# Patient Record
Sex: Male | Born: 1935 | Race: White | Hispanic: No | Marital: Married | Smoking: Former smoker
Health system: Southern US, Community
[De-identification: ages and names within clinical notes are randomized; demographics above are authoritative.]

## PROBLEM LIST (undated history)

## (undated) DIAGNOSIS — S42309A Unspecified fracture of shaft of humerus, unspecified arm, initial encounter for closed fracture: Secondary | ICD-10-CM

## (undated) DIAGNOSIS — S82899A Other fracture of unspecified lower leg, initial encounter for closed fracture: Secondary | ICD-10-CM

## (undated) DIAGNOSIS — I10 Essential (primary) hypertension: Secondary | ICD-10-CM

## (undated) DIAGNOSIS — E039 Hypothyroidism, unspecified: Secondary | ICD-10-CM

## (undated) DIAGNOSIS — F10988 Alcohol use, unspecified with other alcohol-induced disorder: Secondary | ICD-10-CM

## (undated) DIAGNOSIS — F329 Major depressive disorder, single episode, unspecified: Secondary | ICD-10-CM

## (undated) DIAGNOSIS — E7800 Pure hypercholesterolemia, unspecified: Secondary | ICD-10-CM

## (undated) DIAGNOSIS — F32A Depression, unspecified: Secondary | ICD-10-CM

## (undated) DIAGNOSIS — F10188 Alcohol abuse with other alcohol-induced disorder: Secondary | ICD-10-CM

## (undated) DIAGNOSIS — F1027 Alcohol dependence with alcohol-induced persisting dementia: Secondary | ICD-10-CM

## (undated) HISTORY — DX: Unspecified fracture of shaft of humerus, unspecified arm, initial encounter for closed fracture: S42.309A

## (undated) HISTORY — DX: Other fracture of unspecified lower leg, initial encounter for closed fracture: S82.899A

## (undated) HISTORY — DX: Depression, unspecified: F32.A

## (undated) HISTORY — DX: Essential (primary) hypertension: I10

## (undated) HISTORY — DX: Hypothyroidism, unspecified: E03.9

## (undated) HISTORY — DX: Pure hypercholesterolemia, unspecified: E78.00

## (undated) HISTORY — DX: Alcohol abuse with other alcohol-induced disorder: F10.188

## (undated) HISTORY — DX: Alcohol dependence with alcohol-induced persisting dementia: F10.27

## (undated) HISTORY — DX: Major depressive disorder, single episode, unspecified: F32.9

## (undated) HISTORY — DX: Alcohol use, unspecified with other alcohol-induced disorder: F10.988

---
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
## 1965-03-28 HISTORY — PX: VARICOCELECTOMY: SHX1084

## 1993-03-28 HISTORY — PX: ELBOW SURGERY: SHX618

## 1995-02-16 ENCOUNTER — Ambulatory Visit: Admission: RE | Admit: 1995-02-16 | Payer: Self-pay | Source: Ambulatory Visit | Admitting: Surgery

## 1999-01-05 ENCOUNTER — Ambulatory Visit: Admission: AD | Admit: 1999-01-05 | Payer: Self-pay | Source: Ambulatory Visit | Admitting: Gastroenterology

## 2000-07-21 ENCOUNTER — Emergency Department: Admit: 2000-07-21 | Payer: Self-pay | Source: Emergency Department | Admitting: Emergency Medicine

## 2002-03-12 ENCOUNTER — Ambulatory Visit: Admission: RE | Admit: 2002-03-12 | Payer: Self-pay | Source: Ambulatory Visit | Admitting: Ophthalmology

## 2004-01-06 ENCOUNTER — Observation Stay
Admission: EM | Admit: 2004-01-06 | Disposition: A | Discharge status: Home / Self Care | Payer: Self-pay | Source: Emergency Department | Admitting: Internal Medicine

## 2004-02-06 ENCOUNTER — Ambulatory Visit: Admission: RE | Admit: 2004-02-06 | Payer: Self-pay | Source: Ambulatory Visit | Admitting: Gastroenterology

## 2006-02-06 ENCOUNTER — Ambulatory Visit
Admit: 2006-02-06 | Disposition: A | Discharge status: Home / Self Care | Payer: Self-pay | Source: Ambulatory Visit | Admitting: Urology

## 2006-02-06 LAB — CBC WITH AUTO DIFFERENTIAL CERNER
Basophils Absolute: 0 /mm3 (ref 0.0–0.2)
Basophils: 0 % (ref 0–2)
Eosinophils Absolute: 0.1 /mm3 (ref 0.0–0.7)
Eosinophils: 2 % (ref 0–5)
Granulocytes Absolute: 2.8 /mm3 (ref 1.8–8.1)
Hematocrit: 42.2 % (ref 42.0–52.0)
Hgb: 14.4 G/DL (ref 13.0–17.0)
Lymphocytes Absolute: 2.1 /mm3 (ref 0.5–4.4)
Lymphocytes: 37 % (ref 15–41)
MCH: 32.8 PG — ABNORMAL HIGH (ref 28.0–32.0)
MCHC: 34.3 G/DL (ref 32.0–36.0)
MCV: 95.6 FL (ref 80.0–100.0)
MPV: 8.3 FL (ref 7.4–10.4)
Monocytes Absolute: 0.7 /mm3 (ref 0.0–1.2)
Monocytes: 13 % — ABNORMAL HIGH (ref 0–11)
Neutrophils %: 49 % — ABNORMAL LOW (ref 52–75)
Platelets: 234 /mm3 (ref 140–400)
RBC: 4.41 /mm3 — ABNORMAL LOW (ref 4.70–6.00)
RDW: 14.1 % (ref 11.5–15.0)
WBC: 5.7 /mm3 (ref 3.5–10.8)

## 2006-02-06 LAB — BASIC METABOLIC PANEL
BUN: 16 mg/dL (ref 8–20)
CO2: 31 mEq/L — ABNORMAL HIGH (ref 21–30)
Calcium: 8.9 mg/dL (ref 8.6–10.2)
Chloride: 99 mEq/L (ref 98–107)
Creatinine: 1.2 mg/dL (ref 0.6–1.5)
Glucose: 84 mg/dL (ref 70–100)
Potassium: 4.5 mEq/L (ref 3.6–5.0)
Sodium: 139 mEq/L (ref 136–146)

## 2006-02-06 LAB — PT/INR
PT INR: 0.9 {INR} (ref 0.9–1.1)
PT: 11.3 s (ref 10.8–13.3)

## 2006-02-06 LAB — APTT: PTT: 31 s (ref 21–32)

## 2006-02-06 LAB — GFR

## 2009-04-05 ENCOUNTER — Emergency Department: Admit: 2009-04-05 | Payer: Self-pay | Source: Emergency Department | Admitting: Emergency Medicine

## 2009-04-05 LAB — GFR: EGFR: 53.4

## 2009-04-05 LAB — COMPREHENSIVE METABOLIC PANEL
ALT: 37 U/L (ref 21–72)
AST (SGOT): 42 U/L (ref 20–57)
Albumin/Globulin Ratio: 1.4 (ref 1.1–1.8)
Albumin: 4.1 g/dL (ref 3.7–5.1)
Alkaline Phosphatase: 59 U/L (ref 43–122)
BUN: 24 mg/dL — ABNORMAL HIGH (ref 7–21)
Bilirubin, Total: 0.6 mg/dL (ref 0.2–1.3)
CO2: 28 mEq/L (ref 22–31)
Calcium: 9.4 mg/dL (ref 8.6–10.2)
Chloride: 100 mEq/L (ref 98–107)
Creatinine: 1.3 mg/dL (ref 0.5–1.4)
Globulin: 3 g/dL (ref 2.0–3.7)
Glucose: 96 mg/dL (ref 70–100)
Potassium: 4.7 mEq/L (ref 3.6–5.0)
Protein, Total: 7.1 g/dL (ref 6.0–8.0)
Sodium: 137 mEq/L (ref 136–143)

## 2009-04-05 LAB — CBC AND DIFFERENTIAL
Basophils %: 0 % (ref 0–2)
Basophils Absolute: 0 10*3/uL (ref 0.00–0.20)
Eosinophils %: 0 % (ref 0–5)
Eosinophils Absolute: 0 10*3/uL (ref 0.00–0.70)
Granulocytes Absolute: 5.8 10*3/uL (ref 1.80–8.10)
Hematocrit: 41.3 % — ABNORMAL LOW (ref 42.0–52.0)
Hgb: 13.9 g/dL (ref 13.0–17.0)
Immature Granulocytes Absolute: 0.01 10*3/uL
Immature Granulocytes: 0 % (ref 0–1)
Lymphocytes Absolute: 1.4 10*3/uL (ref 0.50–4.40)
Lymphocytes: 17 % (ref 15–41)
MCH: 32.6 pg — ABNORMAL HIGH (ref 28.0–32.0)
MCHC: 33.7 g/dL (ref 32.0–36.0)
MCV: 96.9 fL (ref 80.0–100.0)
MPV: 10.7 fL (ref 9.4–12.3)
Monocytes Absolute: 0.9 10*3/uL (ref 0.00–1.20)
Monocytes: 11 % (ref 0–11)
Neutrophils %: 72 % (ref 52–75)
Platelets: 204 10*3/uL (ref 140–400)
RBC: 4.26 10*6/uL — ABNORMAL LOW (ref 4.70–6.00)
RDW: 14 % (ref 12–15)
WBC: 8.09 10*3/uL (ref 3.50–10.80)

## 2009-04-05 LAB — TROPONIN I: Troponin I: 0.01 ng/mL (ref 0.00–0.03)

## 2009-11-17 ENCOUNTER — Emergency Department: Admit: 2009-11-17 | Payer: Self-pay | Source: Emergency Department | Admitting: Emergency Medicine

## 2009-11-24 ENCOUNTER — Ambulatory Visit
Admit: 2009-11-24 | Disposition: A | Discharge status: Home / Self Care | Payer: Self-pay | Source: Ambulatory Visit | Admitting: Neurology

## 2009-11-24 LAB — CREATININE, SERUM: Creatinine: 1.1 mg/dL (ref 0.5–1.4)

## 2009-11-24 LAB — GFR: EGFR: >60

## 2009-11-24 LAB — BUN: BUN: 23 mg/dL — ABNORMAL HIGH (ref 7–21)

## 2009-12-01 ENCOUNTER — Ambulatory Visit
Admit: 2009-12-01 | Disposition: A | Discharge status: Home / Self Care | Payer: Self-pay | Source: Ambulatory Visit | Admitting: Neurology

## 2009-12-07 ENCOUNTER — Inpatient Hospital Stay
Admission: EM | Admit: 2009-12-07 | Disposition: A | Discharge status: Home / Self Care | Payer: Self-pay | Source: Ambulatory Visit | Admitting: Internal Medicine

## 2009-12-07 LAB — T4, FREE: T4 Free: 1.38 ng/dL (ref 0.78–1.85)

## 2009-12-07 LAB — CBC AND DIFFERENTIAL
Baso(Absolute): 0.01 10*3/uL (ref 0.00–0.20)
Basophils: 0 % (ref 0–2)
Eosinophils Absolute: 0 10*3/uL (ref 0.00–0.70)
Eosinophils: 0 % (ref 0–5)
Hematocrit: 37.6 % — ABNORMAL LOW (ref 42.0–52.0)
Hgb: 14 g/dL (ref 13.0–17.0)
Immature Granulocytes Absolute: 0.05 10*3/uL
Immature Granulocytes: 0 % (ref 0–1)
Lymphocytes Absolute: 1.43 10*3/uL (ref 0.50–4.40)
Lymphocytes: 12 % — ABNORMAL LOW (ref 15–41)
MCH: 32.9 pg — ABNORMAL HIGH (ref 28.0–32.0)
MCHC: 37.2 g/dL — ABNORMAL HIGH (ref 32.0–36.0)
MCV: 88.3 fL (ref 80.0–100.0)
MPV: 9.9 fL (ref 9.4–12.3)
Monocytes Absolute: 1.8 10*3/uL — ABNORMAL HIGH (ref 0.00–1.20)
Monocytes: 14 % — ABNORMAL HIGH (ref 0–11)
Neutrophils Absolute: 9.23 10*3/uL
Neutrophils: 74 % (ref 52–75)
Platelets: 197 10*3/uL (ref 140–400)
RBC: 4.26 10*6/uL — ABNORMAL LOW (ref 4.70–6.00)
RDW: 13 % (ref 12–15)
WBC: 12.47 10*3/uL — ABNORMAL HIGH (ref 3.50–10.80)

## 2009-12-07 LAB — BASIC METABOLIC PANEL
BUN: 19 mg/dL (ref 7–21)
BUN: 18 mg/dL (ref 7–21)
CO2: 29 mEq/L (ref 22–31)
CO2: 30 mEq/L (ref 22–31)
Calcium: 7.4 mg/dL — ABNORMAL LOW (ref 8.6–10.2)
Calcium: 7.6 mg/dL — ABNORMAL LOW (ref 8.6–10.2)
Chloride: 74 mEq/L — ABNORMAL LOW (ref 98–107)
Chloride: 77 mEq/L — ABNORMAL LOW (ref 98–107)
Creatinine: 0.7 mg/dL (ref 0.5–1.4)
Creatinine: 0.8 mg/dL (ref 0.5–1.4)
Glucose: 92 mg/dL (ref 70–100)
Glucose: 111 mg/dL — ABNORMAL HIGH (ref 70–100)
Potassium: 3.5 mEq/L — ABNORMAL LOW (ref 3.6–5.0)
Potassium: 3 mEq/L — ABNORMAL LOW (ref 3.6–5.0)
Sodium: 109 mEq/L — CR (ref 136–143)
Sodium: 110 mEq/L — CR (ref 136–143)

## 2009-12-07 LAB — COMPREHENSIVE METABOLIC PANEL
ALT: 53 U/L (ref 21–72)
AST (SGOT): 89 U/L — ABNORMAL HIGH (ref 20–57)
Albumin/Globulin Ratio: 1.3 (ref 1.1–1.8)
Albumin: 3.7 g/dL (ref 3.7–5.1)
Alkaline Phosphatase: 48 U/L (ref 43–122)
BUN: 21 mg/dL (ref 7–21)
Bilirubin, Total: 1.6 mg/dL — ABNORMAL HIGH (ref 0.2–1.3)
CO2: 32 mEq/L — ABNORMAL HIGH (ref 22–31)
Calcium: 8.3 mg/dL — ABNORMAL LOW (ref 8.6–10.2)
Chloride: 71 mEq/L — ABNORMAL LOW (ref 98–107)
Creatinine: 0.8 mg/dL (ref 0.5–1.4)
Globulin: 2.8 g/dL (ref 2.0–3.7)
Glucose: 108 mg/dL — ABNORMAL HIGH (ref 70–100)
Potassium: 3.9 mEq/L (ref 3.6–5.0)
Protein, Total: 6.5 g/dL (ref 6.0–8.0)
Sodium: 108 mEq/L — CR (ref 136–143)

## 2009-12-07 LAB — GFR
EGFR: >60
EGFR: >60
EGFR: >60

## 2009-12-07 LAB — URINALYSIS, REFLEX TO MICROSCOPIC EXAM IF INDICATED
Bilirubin, UA: NEGATIVE
Glucose, UA: NEGATIVE
Ketones UA: 25 — AB
Leukocyte Esterase, UA: NEGATIVE
Nitrite, UA: NEGATIVE
Protein, UR: 30 — AB
Specific Gravity UA POCT: 1.005 (ref 1.001–1.035)
Urine pH: 6 (ref 5.0–8.0)
Urobilinogen, UA: NORMAL mg/dL

## 2009-12-07 LAB — CORTISOL: Cortisol: 11.6 ug/dL

## 2009-12-07 LAB — TSH: TSH: 4.07 u[IU]/mL (ref 0.465–4.680)

## 2009-12-07 LAB — CK: Creatine Kinase (CK): 458 U/L — ABNORMAL HIGH (ref 20–297)

## 2009-12-07 LAB — TROPONIN I: Troponin I: <0.01 ng/mL (ref 0.00–0.03)

## 2009-12-07 LAB — URIC ACID: Uric acid: 1.5 mg/dL — ABNORMAL LOW (ref 3.5–8.5)

## 2009-12-07 LAB — CKMB: Creatinine Kinase MB (CKMB): 10.8 ng/mL — ABNORMAL HIGH (ref 0.0–4.9)

## 2009-12-08 LAB — CBC AND DIFFERENTIAL
Baso(Absolute): 0.01 10*3/uL (ref 0.00–0.20)
Basophils: 0 % (ref 0–2)
Eosinophils Absolute: 0.06 10*3/uL (ref 0.00–0.70)
Eosinophils: 1 % (ref 0–5)
Hematocrit: 36 % — ABNORMAL LOW (ref 42.0–52.0)
Hgb: 13.2 g/dL (ref 13.0–17.0)
Immature Granulocytes Absolute: 0.04 10*3/uL
Immature Granulocytes: 0 % (ref 0–1)
Lymphocytes Absolute: 1.51 10*3/uL (ref 0.50–4.40)
Lymphocytes: 15 % (ref 15–41)
MCH: 32.8 pg — ABNORMAL HIGH (ref 28.0–32.0)
MCHC: 36.7 g/dL — ABNORMAL HIGH (ref 32.0–36.0)
MCV: 89.6 fL (ref 80.0–100.0)
MPV: 9.5 fL (ref 9.4–12.3)
Monocytes Absolute: 1.79 10*3/uL — ABNORMAL HIGH (ref 0.00–1.20)
Monocytes: 17 % — ABNORMAL HIGH (ref 0–11)
Neutrophils Absolute: 7.07 10*3/uL
Neutrophils: 68 % (ref 52–75)
Platelets: 164 10*3/uL (ref 140–400)
RBC: 4.02 10*6/uL — ABNORMAL LOW (ref 4.70–6.00)
RDW: 13 % (ref 12–15)
WBC: 10.44 10*3/uL (ref 3.50–10.80)

## 2009-12-08 LAB — BASIC METABOLIC PANEL
BUN: 17 mg/dL (ref 7–21)
BUN: 15 mg/dL (ref 7–21)
BUN: 15 mg/dL (ref 7–21)
BUN: 15 mg/dL (ref 7–21)
BUN: 17 mg/dL (ref 7–21)
CO2: 28 mEq/L (ref 22–31)
CO2: 29 mEq/L (ref 22–31)
CO2: 28 mEq/L (ref 22–31)
CO2: 28 mEq/L (ref 22–31)
CO2: 26 mEq/L (ref 22–31)
Calcium: 7.4 mg/dL — ABNORMAL LOW (ref 8.6–10.2)
Calcium: 7.5 mg/dL — ABNORMAL LOW (ref 8.6–10.2)
Calcium: 7.3 mg/dL — ABNORMAL LOW (ref 8.6–10.2)
Calcium: 7.9 mg/dL — ABNORMAL LOW (ref 8.6–10.2)
Calcium: 7.8 mg/dL — ABNORMAL LOW (ref 8.6–10.2)
Chloride: 80 mEq/L — ABNORMAL LOW (ref 98–107)
Chloride: 84 mEq/L — ABNORMAL LOW (ref 98–107)
Chloride: 86 mEq/L — ABNORMAL LOW (ref 98–107)
Chloride: 87 mEq/L — ABNORMAL LOW (ref 98–107)
Chloride: 91 mEq/L — ABNORMAL LOW (ref 98–107)
Creatinine: 0.7 mg/dL (ref 0.5–1.4)
Creatinine: 0.7 mg/dL (ref 0.5–1.4)
Creatinine: 0.7 mg/dL (ref 0.5–1.4)
Creatinine: 0.8 mg/dL (ref 0.5–1.4)
Creatinine: 0.8 mg/dL (ref 0.5–1.4)
Glucose: 90 mg/dL (ref 70–100)
Glucose: 89 mg/dL (ref 70–100)
Glucose: 85 mg/dL (ref 70–100)
Glucose: 121 mg/dL — ABNORMAL HIGH (ref 70–100)
Glucose: 75 mg/dL (ref 70–100)
Potassium: 3.2 mEq/L — ABNORMAL LOW (ref 3.6–5.0)
Potassium: 3.3 mEq/L — ABNORMAL LOW (ref 3.6–5.0)
Potassium: 3.5 mEq/L — ABNORMAL LOW (ref 3.6–5.0)
Potassium: 3.3 mEq/L — ABNORMAL LOW (ref 3.6–5.0)
Potassium: 4.2 mEq/L (ref 3.6–5.0)
Sodium: 109 mEq/L — CR (ref 136–143)
Sodium: 114 mEq/L — CR (ref 136–143)
Sodium: 116 mEq/L — CR (ref 136–143)
Sodium: 118 mEq/L — CR (ref 136–143)
Sodium: 118 mEq/L — CR (ref 136–143)

## 2009-12-08 LAB — CHLORIDE, URINE, RANDOM: Chloride, UR: <20 mEq/L

## 2009-12-08 LAB — GFR
EGFR: >60
EGFR: >60
EGFR: >60
EGFR: >60
EGFR: >60

## 2009-12-08 LAB — POTASSIUM, URINE, RANDOM: Urine Potassium Random: 23.1 mEq/L

## 2009-12-08 LAB — PHOSPHORUS: Phosphorus: 1.8 mg/dL — ABNORMAL LOW (ref 2.5–4.5)

## 2009-12-08 LAB — CREATININE, URINE, RANDOM
Urine Creatinine, Random: 73.3 mg/dl
Urine Creatinine, Random: 26.4 mg/dl

## 2009-12-08 LAB — MAGNESIUM: Magnesium: 1.8 mg/dL (ref 1.6–2.3)

## 2009-12-08 LAB — SODIUM, URINE, RANDOM
Urine Sodium Random: 20 mEq/L
Urine Sodium Random: 34 mEq/L

## 2009-12-08 LAB — OSMOLALITY, URINE: Urine Osmolality: 382 mOsm/kg (ref 50–1400)

## 2009-12-08 LAB — OSMOLALITY: Osmolality: 246 mOsm/kg — ABNORMAL LOW (ref 275–295)

## 2009-12-08 LAB — URIC ACID: Uric acid: 1.8 mg/dL — ABNORMAL LOW (ref 3.5–8.5)

## 2009-12-09 LAB — CBC AND DIFFERENTIAL
Baso(Absolute): 0.01 10*3/uL (ref 0.00–0.20)
Basophils: 0 % (ref 0–2)
Eosinophils Absolute: 0.08 10*3/uL (ref 0.00–0.70)
Eosinophils: 1 % (ref 0–5)
Hematocrit: 37.9 % — ABNORMAL LOW (ref 42.0–52.0)
Hgb: 13.4 g/dL (ref 13.0–17.0)
Immature Granulocytes Absolute: 0.04 10*3/uL
Immature Granulocytes: 0 % (ref 0–1)
Lymphocytes Absolute: 1.83 10*3/uL (ref 0.50–4.40)
Lymphocytes: 18 % (ref 15–41)
MCH: 32.6 pg — ABNORMAL HIGH (ref 28.0–32.0)
MCHC: 35.4 g/dL (ref 32.0–36.0)
MCV: 92.2 fL (ref 80.0–100.0)
MPV: 9.7 fL (ref 9.4–12.3)
Monocytes Absolute: 1.76 10*3/uL — ABNORMAL HIGH (ref 0.00–1.20)
Monocytes: 18 % — ABNORMAL HIGH (ref 0–11)
Neutrophils Absolute: 6.2 10*3/uL
Neutrophils: 63 % (ref 52–75)
Platelets: 193 10*3/uL (ref 140–400)
RBC: 4.11 10*6/uL — ABNORMAL LOW (ref 4.70–6.00)
RDW: 13 % (ref 12–15)
WBC: 9.88 10*3/uL (ref 3.50–10.80)

## 2009-12-09 LAB — COMPREHENSIVE METABOLIC PANEL
ALT: 53 U/L (ref 21–72)
AST (SGOT): 65 U/L — ABNORMAL HIGH (ref 20–57)
Albumin/Globulin Ratio: 1.2 (ref 1.1–1.8)
Albumin: 3.2 g/dL — ABNORMAL LOW (ref 3.7–5.1)
Alkaline Phosphatase: 46 U/L (ref 43–122)
BUN: 13 mg/dL (ref 7–21)
Bilirubin, Total: 1.3 mg/dL (ref 0.2–1.3)
CO2: 27 mEq/L (ref 22–31)
Calcium: 8 mg/dL — ABNORMAL LOW (ref 8.6–10.2)
Chloride: 94 mEq/L — ABNORMAL LOW (ref 98–107)
Creatinine: 0.8 mg/dL (ref 0.5–1.4)
Globulin: 2.7 g/dL (ref 2.0–3.7)
Glucose: 82 mg/dL (ref 70–100)
Potassium: 4.1 mEq/L (ref 3.6–5.0)
Protein, Total: 5.9 g/dL — ABNORMAL LOW (ref 6.0–8.0)
Sodium: 124 mEq/L — ABNORMAL LOW (ref 136–143)

## 2009-12-09 LAB — GFR
EGFR: >60
EGFR: >60

## 2009-12-09 LAB — BASIC METABOLIC PANEL
BUN: 16 mg/dL (ref 7–21)
CO2: 28 mEq/L (ref 22–31)
Calcium: 8.3 mg/dL — ABNORMAL LOW (ref 8.6–10.2)
Chloride: 92 mEq/L — ABNORMAL LOW (ref 98–107)
Creatinine: 1 mg/dL (ref 0.5–1.4)
Glucose: 93 mg/dL (ref 70–100)
Potassium: 3.9 mEq/L (ref 3.6–5.0)
Sodium: 125 mEq/L — ABNORMAL LOW (ref 136–143)

## 2009-12-09 LAB — MAGNESIUM: Magnesium: 1.9 mg/dL (ref 1.6–2.3)

## 2009-12-09 LAB — PHOSPHORUS: Phosphorus: 1.8 mg/dL — ABNORMAL LOW (ref 2.5–4.5)

## 2009-12-09 LAB — CALCIUM, IONIZED: Calcium, Ionized: 2.34 mEq/L (ref 2.30–2.58)

## 2009-12-10 LAB — BASIC METABOLIC PANEL
BUN: 14 mg/dL (ref 7–21)
CO2: 29 mEq/L (ref 22–31)
Calcium: 7.9 mg/dL — ABNORMAL LOW (ref 8.6–10.2)
Chloride: 97 mEq/L — ABNORMAL LOW (ref 98–107)
Creatinine: 0.9 mg/dL (ref 0.5–1.4)
Glucose: 86 mg/dL (ref 70–100)
Potassium: 3.9 mEq/L (ref 3.6–5.0)
Sodium: 129 mEq/L — ABNORMAL LOW (ref 136–143)

## 2009-12-10 LAB — MAGNESIUM: Magnesium: 1.7 mg/dL (ref 1.6–2.3)

## 2009-12-10 LAB — PHOSPHORUS: Phosphorus: 2.4 mg/dL — ABNORMAL LOW (ref 2.5–4.5)

## 2009-12-10 LAB — GFR: EGFR: >60

## 2009-12-10 LAB — ALBUMIN: Albumin: 2.9 g/dL — ABNORMAL LOW (ref 3.7–5.1)

## 2009-12-11 LAB — GFR: EGFR: >60

## 2009-12-11 LAB — BASIC METABOLIC PANEL
BUN: 9 mg/dL (ref 7–21)
CO2: 30 mEq/L (ref 22–31)
Calcium: 8 mg/dL — ABNORMAL LOW (ref 8.6–10.2)
Chloride: 98 mEq/L (ref 98–107)
Creatinine: 0.9 mg/dL (ref 0.5–1.4)
Glucose: 92 mg/dL (ref 70–100)
Potassium: 4 mEq/L (ref 3.6–5.0)
Sodium: 131 mEq/L — ABNORMAL LOW (ref 136–143)

## 2009-12-11 LAB — MAGNESIUM: Magnesium: 2.1 mg/dL (ref 1.6–2.3)

## 2010-01-26 ENCOUNTER — Ambulatory Visit
Admit: 2010-01-26 | Disposition: A | Discharge status: Home / Self Care | Payer: Self-pay | Source: Ambulatory Visit | Admitting: Ophthalmology

## 2010-01-26 LAB — BUN: BUN: 26 mg/dL — ABNORMAL HIGH (ref 7–21)

## 2010-01-26 LAB — CREATININE, SERUM: Creatinine: 1.1 mg/dL (ref 0.5–1.4)

## 2010-01-26 LAB — GFR: EGFR: >60

## 2010-01-28 ENCOUNTER — Ambulatory Visit
Admit: 2010-01-28 | Disposition: A | Discharge status: Home / Self Care | Payer: Self-pay | Source: Ambulatory Visit | Admitting: Internal Medicine

## 2010-05-21 ENCOUNTER — Emergency Department: Admit: 2010-05-21 | Payer: Self-pay | Source: Emergency Department | Admitting: Emergency Medicine

## 2010-05-21 LAB — COMPREHENSIVE METABOLIC PANEL
ALT: 42 U/L (ref 21–72)
AST (SGOT): 57 U/L (ref 20–57)
Albumin/Globulin Ratio: 1.3 (ref 1.1–1.8)
Albumin: 4.5 g/dL (ref 3.7–5.1)
Alkaline Phosphatase: 68 U/L (ref 43–122)
BUN: 20 mg/dL (ref 7–21)
Bilirubin, Total: 0.8 mg/dL (ref 0.2–1.3)
CO2: 29 mEq/L (ref 22–31)
Calcium: 9.1 mg/dL (ref 8.6–10.2)
Chloride: 102 mEq/L (ref 98–107)
Creatinine: 1 mg/dL (ref 0.5–1.4)
Globulin: 3.4 g/dL (ref 2.0–3.7)
Glucose: 98 mg/dL (ref 70–100)
Potassium: 4.1 mEq/L (ref 3.6–5.0)
Protein, Total: 7.9 g/dL (ref 6.0–8.0)
Sodium: 139 mEq/L (ref 136–143)

## 2010-05-21 LAB — GFR: EGFR: >60

## 2010-05-21 LAB — URINALYSIS, REFLEX TO MICROSCOPIC EXAM IF INDICATED
Bilirubin, UA: NEGATIVE
Blood, UA: NEGATIVE
Glucose, UA: NEGATIVE
Ketones UA: NEGATIVE
Leukocyte Esterase, UA: NEGATIVE
Nitrite, UA: NEGATIVE
Protein, UR: NEGATIVE
Specific Gravity UA POCT: 1.005 (ref 1.001–1.035)
Urine pH: 8 (ref 5.0–8.0)
Urobilinogen, UA: NORMAL mg/dL

## 2010-06-23 ENCOUNTER — Ambulatory Visit
Admit: 2010-06-23 | Disposition: A | Discharge status: Home / Self Care | Payer: Self-pay | Source: Ambulatory Visit | Admitting: Neurology

## 2010-06-24 LAB — PROTEIN ELECTROPHORESIS, SERUM
Albumin %: 53.6 % (ref 46.6–62.6)
Albumin, Synovial: 3.8 g/dL (ref 3.4–4.8)
Alpha-1 Glob %: 3.1 % (ref 1.7–4.1)
Alpha-1 Globulin: 0.2 g/dL (ref 0.1–0.4)
Alpha-2 Glob %: 12.2 % (ref 8.9–14.9)
Alpha-2 Globulin: 0.9 g/dL (ref 0.8–1.2)
Beta Glob %: 11.3 % (ref 10.9–18.9)
Beta Globulin: 0.8 g/dL (ref 0.6–1.2)
Gamma Globulin %: 19.7 % (ref 9.8–24.4)
Gamma Globulin: 1.4 g/dL (ref 0.6–1.7)
Protein, Total: 7 g/dL (ref 6.0–8.3)

## 2010-06-24 LAB — ANA SCREEN REFLEX: ANA Screen Reflex: NEGATIVE U/mL

## 2010-06-24 LAB — VITAMIN B12: Vitamin B-12: 660 pg/mL (ref 211–911)

## 2010-06-24 LAB — FOLATE: Folate: >24 ng/mL

## 2010-06-25 LAB — SERUM PROTEIN ELECTROPHORESIS REVIEW

## 2010-12-10 LAB — ECG 12-LEAD
Atrial Rate: 66 {beats}/min
P Axis: 52 degrees
P-R Interval: 178 ms
Q-T Interval: 398 ms
QRS Duration: 88 ms
QTC Calculation (Bezet): 417 ms
R Axis: -8 degrees
T Axis: 44 degrees
Ventricular Rate: 66 {beats}/min

## 2010-12-14 LAB — ECG 12-LEAD
Atrial Rate: 74 {beats}/min
P Axis: 51 degrees
P-R Interval: 186 ms
Q-T Interval: 424 ms
QRS Duration: 98 ms
QTC Calculation (Bezet): 470 ms
R Axis: -12 degrees
T Axis: 44 degrees
Ventricular Rate: 74 {beats}/min

## 2011-01-12 LAB — ECG 12-LEAD
Atrial Rate: 67 {beats}/min
P Axis: 56 degrees
P-R Interval: 172 ms
Q-T Interval: 398 ms
QRS Duration: 86 ms
QTC Calculation (Bezet): 420 ms
R Axis: -6 degrees
T Axis: 46 degrees
Ventricular Rate: 67 {beats}/min

## 2011-01-14 NOTE — Discharge Summary (Signed)
KAIAN, FAHS      MRN:          [id number]      Account:      [id number]      Document ID:  [id number]                  Admit Date: 12/07/2009      Discharge Date: 12/11/2009            ATTENDING PHYSICIAN:  Neta Mends, MD                  ADMITTING DIAGNOSES:      1.  Hyponatremia.      2.  Hypertension.      3.  Hyperlipidemia.      4.  Hypothyroidism.      5.  Osteoarthritis.            HISTORY OF PRESENT ILLNESS:      This is a 77 year old gentleman with a past medical mentioned above,      admitted December 07, 2009, for feeling generalized weakness and poor      appetite and patient was found to have hyponatremia with sodium 108.            HOSPITAL COURSE:      Patient was admitted to CCU and started on IV hydration and 3% hypertonic      saline.  Nephrology MD, saw the patient on September , 2011, regarding      symptomatic hyponatremia and also followed during the stay in the hospital.       During the stay in the hospital patient was on IV hydration and then      patient's sodium eventually getting better, and then because the patient's      condition improved, the patient was transferred from the CCU to the medical      floor.  Also, blood pressure medication was adjusted.            Today, as the patient's sodium went to 131 and patient had no significant      signs or symptoms, and patient doing well, and then after discussion with      nephrology MD, patient will be discharged home today.            VITAL SIGNS:      At the time discharge:  Blood pressure 154/63, heart rate 87, respiration      18, temperature 96.3, oxygen saturation 99%.            DISCHARGE DIAGNOSES:      1.  Status post hyponatremia.      2.  Hypertension.      3.  Hyperlipidemia.      4.  Hypothyroidism.      5.  Osteoarthritis.            DISCHARGE MEDICATIONS:      1.  Norvasc 5 mg p.o. daily.      2.  Fosamax 70 mg p.o. weekly.                                   Page 1 of 2      REZA, CRYMES      MRN:          [id number]       Account:      [id number]  Document ID:  [id number]                  3.  Lipitor 10 mg p.o. nightly.      4.  Synthroid 25 mcg p.o. daily.      5.  Multivitamin 1 tablet p.o. daily.      6.  Aspirin 81 mg p.o. daily.      7.  Omega-3 one tablet p.o. daily.      8.  Vitamin D 2000 units p.o. daily.            DISCHARGE INSTRUCTIONS:      The patient needs to follow up in PMD office in 1 week.  The patient needs      to check blood tests and repeat basic metabolic panel in the [facility]D office.                        Electronic Signing Provider            D:  12/11/2009 15:49 PM by Dr. Marcelino Freestone, MD ([id number])      T:  12/12/2009 11:35 AM by [email or web address]                        cc:                                   Page 2 of 2      Authenticated and Edited by Marcelino Freestone, MD On 12/18/09 2:41:30 PM

## 2011-01-14 NOTE — H&P (Signed)
George Marks, George Marks      MRN:          [id number]      Account:      [id number]      Document ID:  [id number]                  Admit Date: 12/07/2009            Patient Location: M610-01      Patient Type: I            ATTENDING PHYSICIAN: Neta Mends, MD                  CHIEF COMPLAINT:      Feeling weak and generalized malaise and poor appetite and started 2 days      ago.            HISTORY OF PRESENT ILLNESS:      This is a 77 year old gentleman with a past medical history of      hypertension, hypothyroidism, hyperlipidemia, osteoarthritis, presented to      the emergency room for the complaint of feeling weak and generalized      malaise and poor appetite and leg pain.  The patient stated that he went to      neurology MD's office last week because he had leg pain and then neurology      MD gave him Cymbalta, medication started about 6 days ago.  Then after 3      days he still had pain in the legs and not feeling well and then he stopped      taking the Cymbalta, but today because of generalized weakness is getting      worse and the pain is not getting better and also associated with vomiting      for 2 times, but the vomitus was only old food he ate and no significant      red blood there too.  Also, history of taking Percocet pain medication too.       Because of the symptoms were persistent and then he came to the emergency      room.  Other than that, no chest pain, no fever, no loss of consciousness,      no seizure-like activity and no shortness of breath at this time.  The      patient has a history of high blood pressure, taking hydrochlorothiazide      for a full year and also history of hypothyroidism taking Synthroid      medication too.  At the emergency room, patient was found to have a sodium      of 108 and patient was admitted to CCU for symptomatic hyponatremia.            ALLERGIES:      No known drug allergies.            MEDICATIONS AT HOME:      1.  Levothyroxine 25 mcg p.o. daily.      2.   Fosamax 70 mg p.o. every weekly.      3.  Cymbalta 30 mg p.o. daily, but stopped 2 days ago.      4.  Lipitor 10 mg p.o. nightly.      5.  Omega-3 1470 mg p.o. daily.      6.  Vitamin D3 2000 units p.o. daily.      7.  Coenzyme Q10 100 mg p.o. daily.  8.  Norvasc 2.5 mg p.o. daily.      9.  Tekturna 200 mg p.o. daily.                                   Page 1 of 3      George Marks, George Marks      MRN:          [id number]      Account:      [id number]      Document ID:  [id number]                  10.  Multivitamin one tablet p.o. daily.      11.  Aspirin 81 mg p.o. daily.            PAST MEDICAL HISTORY:      1.  Hypertension.      2.  Hyperlipidemia.      3.  Hypothyroidism.      4.  Osteoarthritis.            PAST SURGICAL HISTORY:      Status post left elbow surgery in the past .            FAMILY HISTORY:      Noncontributory.            SOCIAL HISTORY:      The patient denies smoking, occasionally drinking alcohol, drinks 1 glass      of wine in the evening, and denies using recreational drugs at this time.            REVIEW OF SYSTEMS:      This is a 77 year old gentleman, past medical history as mentioned above,      admitted for generalized weakness and found to have hyponatremia.  Other      than that, no chest pain, no palpitation, no loss of consciousness at this      time.            PHYSICAL EXAMINATION:      GENERAL:  The patient lying on the bed, not in distress.      VITAL SIGNS:  Blood pressure 150/70, heart rate 76, respiratory 16,      temperature 98.0, oxygen saturation 100% on room air.      HEENT:  Pupils round, equal, reactive to light.  Nose:  No discharge.      Mouth:  Mucous membranes moist.      NECK:  Supple, no JVD, no carotid bruits.      SKIN:  No rash, no cyanosis.      LUNGS:  Good air entry bilaterally.  No significant expiratory wheezing.      CARDIOVASCULAR:  S1, S2.      ABDOMEN:  Soft, bowel sounds present.  No mass palpable.  No rebound      tenderness.      RECTAL:  Refused.       EXTREMITIES:  No edema.            LABORATORY DATA AND DIAGNOSTIC STUDIES:      WBC 12.47, hemoglobin 14.0, hematocrit 37.6, MCV 89, platelet 197. Sodium 108,      potassium 3.9, chloride 71, bicarbonate 32, BUN 27, creatinine 0.8, glucose      108.  Chest x-ray requested.  Page 2 of 3      George Marks, George Marks      MRN:          [id number]      Account:      [id number]      Document ID:  [id number]                  IMPRESSION:      1.  Symptomatic hyponatremia.      2.  Hypertension.      3.  Hyperlipidemia.      4.  Hypothyroidism.      5.  Osteoarthritis.            PLAN:      1.  Hyponatremia.  This is a 77 year old gentleman with history of      hypertension taking hydrochlorothiazide and hypothyroidism and recently      patient taking Cymbalta and found to have a sodium 108 with generalized      weakness and pain in the legs, but no significant loss of consciousness at      this time.  Plan to admit to critical care unit.  The patient was given      0.9% intravenous normal saline 100 mL per hour, and also the patient was      seen by nephrology medical doctor at the emergency room at this time.  Plan      to check patient's basic metabolic panel every 4 hours and we will adjust      for intravenous fluid per nephrology.  We will discontinue      hydrochlorothiazide and Cymbalta at this time.  We will request      hyponatremia workup.      2.  Hypertension.  Plan to continue Norvasc and aspirin and we will monitor      blood pressure closely.  We will adjust the medication if needed.      3.  Osteoarthritis and hyperlipidemia.  Plan to continue current      medication.  Will give pain medication p.r.n. if needed.  Further      management depends on the patient's clinical course.                        Electronic Signing Provider            D:  12/07/2009 17:50 PM by Dr. Marcelino Freestone, MD ([id number])      T:  12/07/2009 18:47 PM by [email or web address]                  cc:                                    Page 3 of 3      Authenticated and Edited by Marcelino Freestone, MD On 12/18/09 2:41:16 PM

## 2011-01-14 NOTE — Consults (Signed)
George Marks, George Marks      MRN:          [id number]      Account:      [id number]      Document ID:  [id number]      Service Date: 12/07/2009            Admit Date: 12/07/2009            Patient Location: M610-01      Patient Type: I            CONSULTING PHYSICIAN: Jannett Celestine MD            REFERRING PHYSICIAN: Everlean Patterson MD                  REASON FOR CONSULTATION:      Hyponatremia.            IMPRESSION:      1.  Hyponatremia, reasonably acute, with mental status changes and      lethargy.      A.  Likely multifactorial, complicating chronic intake of      hydrochlorothiazide, recent initiation of Cymbalta, poor intake alongside      with nausea, without any seizures, with lethargy, though remained to be      fully awake and oriented.      2.  History of hypothyroidism, on replacement therapy.      3.  History of hypertension of at least 10 years' duration, on combination      medications to include hydrochlorothiazide.      4.  Neuropathic pain to the lower extremities, recently initiated on      Cymbalta and Percocet.      5.  Hypercholesterolemic, on statins.            DISCUSSION AND SUGGESTIONS:      The patient is currently presenting with unsteadiness, sleepiness as well      as difficulty concentrating and has been noted to have a serum sodium level      of 108.  He is awake and oriented, but appears to be a bit sluggish.  He      has already received a liter of normal saline and I will go ahead and check      a stat sodium level on him to ensure that we are not correcting his      hyponatremia very quickly.  Urine studies for fractional excretion of      sodium as well as urine osmolality will be checked.  Cymbalta and      hydrochlorothiazide will be discontinued.  We will ensure that we do not      correct his hyponatremia very quickly, aiming not to exceed perhaps 10 mEq      over the next 24 hours.  I do not believe that 3% sodium chloride is      necessary at this point, but this will be  dictated by his followup      laboratory results.  It is my understanding the patient has recently      completed an MRI of his brain.  We will go ahead and check on that to      ensure that we are not missing a central lesion, potentially causing a      syndrome of inappropriate ADH release.            Thank you very much for allowing [place] to participate in the  care of this                                   Page 1 of 3      George Marks, George Marks      MRN:          [id number]      Account:      [id number]      Document ID:  [id number]      Service Date: 12/07/2009            patient.  We will be happy to follow him alongside you and to offer any      further suggestions.            HISTORY OF PRESENT ILLNESS:      The patient is currently a 77 year old ambidextrous male with a 10+ year      history of hypertension alongside with a history of hypercholesterolemia.      Otherwise, the patient has been reasonably healthy, except for      hypothyroidism on replacement therapy and recent diagnosis of lower      extremity pain felt to be potentially neuropathic.  The patient has been      maintained on long-term antihypertensives to include hydrochlorothiazide at      25 mg a day for the last 4 years or so.  Recently, on account of his lower      extremity neuropathic pain, Cymbalta was started within the last 1 week as      well as Percocet was prescribed.  Over the course of the last 3 to 4 days,      the patient noted increasing nausea, but no vomiting.  He did not have any      diarrhea.  His appetite became markedly reduced.  He later on started      experiencing unsteadiness of gait as well as difficulty concentrating and      recalling events.  Thus, he presented to the emergency room, whereby he was      noted to have significant hyponatremia with a sodium level of 108.  Thus,      we are asked to consult.            Other laboratory work revealed a potassium of 3.9, chloride of 71, and CO2      of 32.  The BUN and  creatinine were 21 and 0.8 with a glucose of 108,      calcium was 8.3, albumin was 3.7.  White cell count was 12.7 with a      hematocrit of 36.7%.  The patient has had prior lab work that showed that      his sodium level was 137 on April 05, 2009, previously being 143 in      October of 2005.  The patient also had a urinalysis from today that shows a      specific gravity of 1.005 with trace protein.  Upon further questioning, he      tells me that he was pushing fluids over the course of last few days given      his nausea.            ALLERGIES:      None known.            SOCIAL HISTORY:      The patient is married, has 2 offspring who  are healthy.  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  He does not actively      smoke or drink and has quit smoking 31 years ago after smoking for perhaps      20 years.  He has never experimented with illicit drugs.            FAMILY HISTORY:      Negative for kidney disease.            REVIEW OF SYSTEMS:      Significant for use of bifocal lenses, grogginess, difficulty with                                   Page 2 of 3      George Marks, George Marks      MRN:          [id number]      Account:      [id number]      Document ID:  [id number]      Service Date: 12/07/2009            concentration, history of hypothyroidism, but negative for hepatitis, TB,      seizures, ulcers, pneumonia, liver disease, lung disease, heart disease,      depression, skin or joint disorders.            OUTPATIENT MEDICATIONS:      Include hydrochlorothiazide, even though it was not recorded in the      emergency room, at 25 mg a day; Cymbalta 30 mg p.o. daily, started within      the last 1 week; levothyroxine 25 mcg p.o. daily; Fosamax 70 mg p.o. q.1      week; Tekturna 300 mg p.o. daily; Lipitor 10 mg p.o. daily; Folbee;      omega-3; vitamin D; CoQ10; amlodipine 2.5 mg p.o. daily; and multiple  other      over-the-counter medications.            PHYSICAL EXAMINATION:      VITAL SIGNS:  Revealed a blood pressure of 159/79 with a pulse of 75,      respiratory rate was 16, temperature was 97.9.      GENERAL:  He was in no acute distress, was awake, oriented to time, place,      and person, was able to provide his full history.      HEENT:  Anicteric sclerae.  Pupils are equal and reactive.  Throat exam was      negative.      NECK:  Supple without any JVD, thyromegaly, lymphadenopathy, or bruits.      HEART:  Regular rate and rhythm.  No impressive gallop, rubs, or murmurs.      LUNGS:  Clear and resonant.      ABDOMEN:  Soft without hepatosplenomegaly, abdominal bruits, or femoral      bruits.      EXTREMITIES:  No edema, cyanosis.  Distal pulses were markedly reduced in      the lower extremities.  Upper extremity examination was essentially      negative.      BACK:  No CVA tenderness, sacral edema, or spinous tenderness.  Electronic Signing Provider            D:  12/07/2009 16:08 PM by Dr. Jannett Celestine, MD [phone])      T:  12/07/2009 18:46 PM by [email or web address]                  KV:[id number] GLOVFIEP MD      Tiana Loft MD                                   Page 3 of 3      Authenticated by Jannett Celestine [phone]) On 12/28/2009 04:04:59 PM

## 2011-03-24 ENCOUNTER — Inpatient Hospital Stay
Admission: EM | Admit: 2011-03-24 | Disposition: A | Discharge status: Home / Self Care | Payer: Self-pay | Source: Emergency Department | Attending: Internal Medicine | Admitting: Internal Medicine

## 2011-03-24 LAB — CBC AND DIFFERENTIAL
Basophils Absolute Automated: 0.01 10*3/uL (ref 0.00–0.20)
Basophils Automated: 0 % (ref 0–2)
Eosinophils Absolute Automated: 0 10*3/uL (ref 0.00–0.70)
Eosinophils Automated: 0 % (ref 0–5)
Hematocrit: 39.6 % — ABNORMAL LOW (ref 42.0–52.0)
Hgb: 13.6 g/dL (ref 13.0–17.0)
Immature Granulocytes Absolute: 0.07 10*3/uL — ABNORMAL HIGH
Immature Granulocytes: 1 % (ref 0–1)
Lymphocytes Absolute Automated: 0.26 10*3/uL — ABNORMAL LOW (ref 0.50–4.40)
Lymphocytes Automated: 2 % — ABNORMAL LOW (ref 15–41)
MCH: 33.4 pg — ABNORMAL HIGH (ref 28.0–32.0)
MCHC: 34.3 g/dL (ref 32.0–36.0)
MCV: 97.3 fL (ref 80.0–100.0)
MPV: 9.9 fL (ref 9.4–12.3)
Monocytes Absolute Automated: 0.77 10*3/uL (ref 0.00–1.20)
Monocytes: 5 % (ref 0–11)
Neutrophils Absolute: 13.66 10*3/uL — ABNORMAL HIGH (ref 1.80–8.10)
Neutrophils: 93 % — ABNORMAL HIGH (ref 52–75)
Nucleated RBC: 0 /100 WBC
Platelets: 114 10*3/uL — ABNORMAL LOW (ref 140–400)
RBC: 4.07 10*6/uL — ABNORMAL LOW (ref 4.70–6.00)
RDW: 15 % (ref 12–15)
WBC: 14.7 10*3/uL — ABNORMAL HIGH (ref 3.50–10.80)

## 2011-03-24 LAB — URINALYSIS, REFLEX TO MICROSCOPIC EXAM IF INDICATED
Bilirubin, UA: NEGATIVE
Glucose, UA: NEGATIVE
Ketones UA: 25 — AB
Leukocyte Esterase, UA: NEGATIVE
Nitrite, UA: NEGATIVE
Protein, UR: 100 — AB
Specific Gravity UA POCT: 1.01 (ref 1.001–1.035)
Urine pH: 6 (ref 5.0–8.0)
Urobilinogen, UA: NORMAL mg/dL

## 2011-03-24 LAB — COMPREHENSIVE METABOLIC PANEL
ALT: 48 U/L (ref 21–72)
AST (SGOT): 67 U/L — ABNORMAL HIGH (ref 20–57)
Albumin/Globulin Ratio: 1.2 (ref 1.1–1.8)
Albumin: 3.3 g/dL — ABNORMAL LOW (ref 3.7–5.1)
Alkaline Phosphatase: 59 U/L (ref 43–122)
BUN: 22 mg/dL — ABNORMAL HIGH (ref 7–21)
Bilirubin, Total: 1.4 mg/dL — ABNORMAL HIGH (ref 0.2–1.3)
CO2: 27 mEq/L (ref 22–31)
Calcium: 7.9 mg/dL — ABNORMAL LOW (ref 8.6–10.2)
Chloride: 90 mEq/L — ABNORMAL LOW (ref 98–107)
Creatinine: 1.1 mg/dL (ref 0.5–1.4)
Globulin: 2.8 g/dL (ref 2.0–3.7)
Glucose: 123 mg/dL — ABNORMAL HIGH (ref 70–100)
Potassium: 3.6 mEq/L (ref 3.6–5.0)
Protein, Total: 6.1 g/dL (ref 6.0–8.0)
Sodium: 123 mEq/L — ABNORMAL LOW (ref 136–143)

## 2011-03-24 LAB — BLOOD GAS, ARTERIAL
Arterial Total CO2: 23.9 mEq/L — ABNORMAL LOW (ref 24.0–30.0)
Base Excess, Arterial: 0.5 mEq/L (ref <=3.0)
HCO3, Arterial: 22.9 mEq/L (ref 20.0–26.0)
O2 Flow: 3.5 L/min
O2 Sat, Arterial: 95.2 % (ref 95.0–99.0)
Temperature: 100.3
pCO2, Arterial: 32.5 mmHg — ABNORMAL LOW (ref 35.0–45.0)
pH, Arterial: 7.468 — ABNORMAL HIGH (ref 7.350–7.450)
pO2, Arterial: 74.2 mmHg — ABNORMAL LOW (ref 80.0–100.0)

## 2011-03-24 LAB — GFR: EGFR: >60

## 2011-03-25 LAB — ECG 12-LEAD
Atrial Rate: 96 {beats}/min
P Axis: 63 degrees
P-R Interval: 160 ms
Q-T Interval: 338 ms
QRS Duration: 90 ms
QTC Calculation (Bezet): 427 ms
R Axis: -10 degrees
T Axis: 59 degrees
Ventricular Rate: 96 {beats}/min

## 2011-03-26 LAB — COMPREHENSIVE METABOLIC PANEL
ALT: 52 U/L (ref 21–72)
AST (SGOT): 70 U/L — ABNORMAL HIGH (ref 20–57)
Albumin/Globulin Ratio: 1 — ABNORMAL LOW (ref 1.1–1.8)
Albumin: 2.7 g/dL — ABNORMAL LOW (ref 3.7–5.1)
Alkaline Phosphatase: 48 U/L (ref 43–122)
BUN: 14 mg/dL (ref 7–21)
Bilirubin, Total: 1 mg/dL (ref 0.2–1.3)
CO2: 29 mEq/L (ref 22–31)
Calcium: 7.8 mg/dL — ABNORMAL LOW (ref 8.6–10.2)
Chloride: 96 mEq/L — ABNORMAL LOW (ref 98–107)
Creatinine: 1 mg/dL (ref 0.5–1.4)
Globulin: 2.7 g/dL (ref 2.0–3.7)
Glucose: 114 mg/dL — ABNORMAL HIGH (ref 70–100)
Potassium: 3.4 mEq/L — ABNORMAL LOW (ref 3.6–5.0)
Protein, Total: 5.4 g/dL — ABNORMAL LOW (ref 6.0–8.0)
Sodium: 129 mEq/L — ABNORMAL LOW (ref 136–143)

## 2011-03-26 LAB — CBC AND DIFFERENTIAL
Basophils Absolute Automated: 0.01 10*3/uL (ref 0.00–0.20)
Basophils Automated: 0 % (ref 0–2)
Eosinophils Absolute Automated: 0 10*3/uL (ref 0.00–0.70)
Eosinophils Automated: 0 % (ref 0–5)
Hematocrit: 35 % — ABNORMAL LOW (ref 42.0–52.0)
Hgb: 11.8 g/dL — ABNORMAL LOW (ref 13.0–17.0)
Immature Granulocytes Absolute: 0.01 10*3/uL
Immature Granulocytes: 0 % (ref 0–1)
Lymphocytes Absolute Automated: 0.77 10*3/uL (ref 0.50–4.40)
Lymphocytes Automated: 14 % — ABNORMAL LOW (ref 15–41)
MCH: 32.2 pg — ABNORMAL HIGH (ref 28.0–32.0)
MCHC: 33.7 g/dL (ref 32.0–36.0)
MCV: 95.6 fL (ref 80.0–100.0)
MPV: 9.8 fL (ref 9.4–12.3)
Monocytes Absolute Automated: 0.93 10*3/uL (ref 0.00–1.20)
Monocytes: 17 % — ABNORMAL HIGH (ref 0–11)
Neutrophils Absolute: 3.63 10*3/uL (ref 1.80–8.10)
Neutrophils: 68 % (ref 52–75)
Nucleated RBC: 0 /100 WBC
Platelets: 133 10*3/uL — ABNORMAL LOW (ref 140–400)
RBC: 3.66 10*6/uL — ABNORMAL LOW (ref 4.70–6.00)
RDW: 15 % (ref 12–15)
WBC: 5.34 10*3/uL (ref 3.50–10.80)

## 2011-03-26 LAB — OSMOLALITY: Osmolality: 274 mOsm/kg — ABNORMAL LOW (ref 280–300)

## 2011-03-26 LAB — GFR: EGFR: >60

## 2011-03-27 LAB — CBC AND DIFFERENTIAL
Basophils Absolute Automated: 0.02 10*3/uL (ref 0.00–0.20)
Basophils Automated: 0 % (ref 0–2)
Eosinophils Absolute Automated: 0.03 10*3/uL (ref 0.00–0.70)
Eosinophils Automated: 1 % (ref 0–5)
Hematocrit: 38.7 % — ABNORMAL LOW (ref 42.0–52.0)
Hgb: 13.3 g/dL (ref 13.0–17.0)
Immature Granulocytes Absolute: 0.03 10*3/uL
Immature Granulocytes: 1 % (ref 0–1)
Lymphocytes Absolute Automated: 1.55 10*3/uL (ref 0.50–4.40)
Lymphocytes Automated: 34 % (ref 15–41)
MCH: 32.9 pg — ABNORMAL HIGH (ref 28.0–32.0)
MCHC: 34.4 g/dL (ref 32.0–36.0)
MCV: 95.8 fL (ref 80.0–100.0)
MPV: 9.5 fL (ref 9.4–12.3)
Monocytes Absolute Automated: 0.77 10*3/uL (ref 0.00–1.20)
Monocytes: 17 % — ABNORMAL HIGH (ref 0–11)
Neutrophils Absolute: 2.22 10*3/uL (ref 1.80–8.10)
Neutrophils: 48 % — ABNORMAL LOW (ref 52–75)
Nucleated RBC: 0 /100 WBC
Platelets: 185 10*3/uL (ref 140–400)
RBC: 4.04 10*6/uL — ABNORMAL LOW (ref 4.70–6.00)
RDW: 15 % (ref 12–15)
WBC: 4.59 10*3/uL (ref 3.50–10.80)

## 2011-03-27 LAB — COMPREHENSIVE METABOLIC PANEL
ALT: 61 U/L (ref 21–72)
AST (SGOT): 73 U/L — ABNORMAL HIGH (ref 20–57)
Albumin/Globulin Ratio: 1 — ABNORMAL LOW (ref 1.1–1.8)
Albumin: 2.9 g/dL — ABNORMAL LOW (ref 3.7–5.1)
Alkaline Phosphatase: 56 U/L (ref 43–122)
BUN: 12 mg/dL (ref 7–21)
Bilirubin, Total: 1.1 mg/dL (ref 0.2–1.3)
CO2: 25 mEq/L (ref 22–31)
Calcium: 8.3 mg/dL — ABNORMAL LOW (ref 8.6–10.2)
Chloride: 103 mEq/L (ref 98–107)
Creatinine: 0.9 mg/dL (ref 0.5–1.4)
Globulin: 2.8 g/dL (ref 2.0–3.7)
Glucose: 101 mg/dL — ABNORMAL HIGH (ref 70–100)
Potassium: 3.5 mEq/L — ABNORMAL LOW (ref 3.6–5.0)
Protein, Total: 5.7 g/dL — ABNORMAL LOW (ref 6.0–8.0)
Sodium: 134 mEq/L — ABNORMAL LOW (ref 136–143)

## 2011-03-27 LAB — GFR: EGFR: >60

## 2011-04-11 NOTE — Discharge Summary (Signed)
George Marks, George Marks                                        MRN:          [id number]                                                          Account:      [id number]                                         Document ID:  [id number]                                                                                                                                        Admit Date: 03/24/2011  Discharge Date: 03/27/2011     ATTENDING PHYSICIAN:  Susann Givens, MD        HISTORY OF PRESENT ILLNESS:  This is a lovely 78 year old Caucasian gentleman who was brought in with  altered mental status and positive influenza and hyponatremia.  For full  details, please look at the admission H and P.     HOSPITAL COURSE:  He was admitted to the regular medical unit.  He was started on Tamiflu and  IV fluids for his hyponatremia.  He was also started on IV antibiotics for  questionable urinary tract infection.  Every day, his mental status  improved.  He was up and about and by the time of discharge, his white cell  count had dropped down to 4.59 and his sodium had gone up from 123 to 134.  As he is doing so much better now, he is being discharged home to follow up  with his primary care physician.     DISCHARGE DIAGNOSES:  1.  Acute influenza.  2.  Hyponatremia.  3.  Hypertension.  4.  Hypothyroidism.  5.  Osteoporosis.  6.  Hyperlipidemia.     DISCHARGE MEDICATIONS:  Include Synthroid 25 mcg daily, Xanax 0.25 mg q.6 hours p.r.n., Lipitor 10  mg daily, Fosamax 79 daily, amlodipine 5 mg daily, aspirin 81 mg daily, and  Tamiflu to finish a course 75 mg b.i.d. for   the next couple of days.           Electronic Signing Provider     D:  03/27/2011 11:15 AM by Dr. Barnie Del A. Kevin Fenton, MD [phone])  T:  03/27/2011 23:53 PM by [id number]           cc:  Page 1 of 1  Authenticated by Reynaldo Minium. Kevin Fenton, MD [phone]) On 04/11/2011 10:24:00 AM

## 2011-04-11 NOTE — H&P (Signed)
LYNNE, RIGHI                                        MRN:          [id number]                                                          Account:      [id number]                                         Document ID:  [id number]                                                                                                                                        Admit Date: 03/24/2011     Patient Location: M331-01  Patient Type: I     ATTENDING PHYSICIAN: Susann Givens, MD        REASON FOR HOSPITALIZATION:  Altered mental status with positive influenza and mild hyponatremia.     HISTORY OF PRESENT ILLNESS:  This is a lovely 78 year old Caucasian who was brought in by his wife  because of altered mental status, with some shivering, low-grade  temperature, tremors, coughing, and jerking movements of the upper  extremity.  He was recently treated for hearing loss with tapered  prednisone about a week ago or so.     PAST MEDICAL HISTORY:  1.  Severe hyponatremia in 2011.  2.  Hypothyroidism.  3.  Hypertension.  4.  Osteoporosis.  5.  Hyperlipidemia.  6.  Anxiety disorder.     ALLERGIES:  No known drug allergies.     CURRENT MEDICATIONS:  Synthroid 25 mcg daily, Xanax 0.25 mg q.6 hours p.r.n., Lipitor 10 mg  daily, Fosamax 70 mg every week, amlodipine 5 mg daily, Co-Q-10 one a day,  vitamin B12 250 mcg  daily, aspirin 81 mg daily.     PHYSICAL EXAMINATION:  VITAL SIGNS:  Showed a temperature of 100.3, BP is 138/63, pulse was 91,  respiratory rate was 18, pulse oximetry is 89% on 2 liters.  HEENT:  PERRLA, EOMI.  NECK:  Supple.  No thyromegaly or lymphadenopathy.  LUNGS:  Clear bilaterally.  HEART:  Regular rate and rhythm.  ABDOMEN:  Obese but benign.  EXTREMITIES:  No edema, jaundice, clubbing, pallor, or cyanosis.  NEUROLOGIC:  Grossly intact.  Page 1 of 2  ORIN, EBERWEIN                                         MRN:          [id number]                                                          Account:      [id number]                                         Document ID:  [id number]                                                                                                                                        INVESTIGATIONS:  White cell count was up at 14.7 with a slight shift to the left.  Sodium  was 123, potassium 3.6, chloride 90, BUN and creatinine 22 and 1.2.  AST  was 67, ALT was 48.  Urinalysis was negative, except for some hematuria.  Rapid flu test was positive for influenza A.  Chest x-ray:  Questionable atelectasis in the left base.     ASSESSMENT AND PLAN:  This elderly gentleman comes here with fever, leukocytosis, positive for  influenza.  We will give him some Tamiflu.  He did have the flu shot, he  said.  Because of a cough and expectoration, I will put him on Levaquin.  We will trend his hyponatremia.  It is not tht bad at this moment in time.  We will check a serum osmolality and put him back on all of his  medications.  I expect a day or 2 of hospitalization.           Electronic Signing Provider     D:  03/25/2011 08:41 AM by Dr. Barnie Del A. Kevin Fenton, MD [phone])  T:  03/25/2011 10:39 AM by        cc:  Page 2 of 2  Authenticated by Reynaldo Minium. Kevin Fenton, MD [phone]) On 04/11/2011 10:23:58 AM

## 2011-05-11 ENCOUNTER — Other Ambulatory Visit: Payer: Self-pay | Admitting: Neurology

## 2011-05-11 DIAGNOSIS — R269 Unspecified abnormalities of gait and mobility: Secondary | ICD-10-CM

## 2011-05-11 DIAGNOSIS — R29898 Other symptoms and signs involving the musculoskeletal system: Secondary | ICD-10-CM

## 2011-05-13 ENCOUNTER — Ambulatory Visit: Payer: Medicare Other | Attending: Neurology

## 2011-05-13 DIAGNOSIS — M5030 Other cervical disc degeneration, unspecified cervical region: Secondary | ICD-10-CM | POA: Insufficient documentation

## 2011-05-13 DIAGNOSIS — R29898 Other symptoms and signs involving the musculoskeletal system: Secondary | ICD-10-CM

## 2011-05-13 DIAGNOSIS — R269 Unspecified abnormalities of gait and mobility: Secondary | ICD-10-CM

## 2011-05-13 DIAGNOSIS — M5020 Other cervical disc displacement, unspecified cervical region: Secondary | ICD-10-CM | POA: Insufficient documentation

## 2011-05-13 DIAGNOSIS — R209 Unspecified disturbances of skin sensation: Secondary | ICD-10-CM | POA: Insufficient documentation

## 2011-06-19 ENCOUNTER — Emergency Department
Admit: 2011-06-19 | Discharge: 2011-06-19 | Disposition: A | Discharge status: Home / Self Care | Payer: Self-pay | Source: Emergency Department | Admitting: Emergency Medicine

## 2012-03-19 NOTE — Op Note (Unsigned)
ADMITTED:            03/12/2002      PROCEDURE DATE: 03/12/2002      ROOM NUMBER:         [id number] [id number]            PREOPERATIVE DIAGNOSIS:  Cataract, left eye.            POSTOPERATIVE DIAGNOSIS:  Cataract, left eye.            LENS:  Alcon SA60AT, +19.50 diopters.            ANESTHESIA:  Topical.            COMPLICATIONS:  None.            DESCRIPTION OF THE PROCEDURE:  After appropriate informed consent was      obtained, the patient had topical anesthesia with 4% nonpreserved lidocaine      q.10 minutes times two to the operated eye.            The patient was then prepped and draped in the usual sterile fashion for      ocular surgery including a Betadine drop instilled topically and wrapping      of the upper and lower lids and lashes behind the drape.  A speculum was      placed between the lids of the operated eye and the operating microscope      was brought into position.  A conjunctival peritomy of 3.0 mm was made at      the 12 o'clock limbus and adequate hemostasis was obtained with bipolar      wet-field cautery.  A corneoscleral groove of 3.0 mm was made at the      12 o'clock limbus 2 mm posterior to the limbus with a 69 blade.      Dissection was carried out with an EdgeAhead blade to the level of clear      cornea and paracentesis was made at the 2 o'clock limbus.  0.5 cc of 1%      nonpreserved lidocaine was instilled into the anterior chamber.  Amvisc      Plus was then placed into the anterior chamber and the anterior chamber was      then re-entered through the original incision through clear cornea with a      2.75-mm blade.  A bent 25-gauge needle was used to raise a capsular flap      and a continuous-tear capsulotomy was made with capsulorrhexis forceps.      Hydrodissection was carried out with balanced salt solution and the nucleus      was then phacoemulsified within the capsular bag using a divide-and-conquer      technique with the Legacy 20,000 handpiece and Koch spatula.  The nucleus       was divided into four quadrants, each of which were phacoemulsified within      the capsular bag.  The irrigation/aspiration was then used to remove      residual cortical material.  An inspection of the capsular bag was noted      and it was intact with no evidence of residual cortical material.  Amvisc      Plus was used to inflate the capsular bag.  The intraocular lens was then      placed through an injector within the capsular bag with the trailing haptic      placed into the capsular bag with the use of  a Lester hook at the      optic/haptic junction.  The intraocular lens was centered with the [facility]      hook and inspected and found to be centered well within the capsular bag.      The irrigation/aspiration was then used to remove residual Amvisc Plus and      Miochol was placed into the anterior chamber.  The incision was then      inspected carefully in all directions and found to be watertight.  The      conjunctiva was draped across the incision and secured with cautery. Two      drops of Voltaren, Vigamox, and Econopred were then instilled into the      operated eye.  The patient was brought to the recovery room in satisfactory      condition.                                    ___________________________________          ______________________      Kathrin Greathouse, MD                               Date            D 03/12/2002  1:24 P; T 03/12/2002  3:06 P; [id number] - - , [id number], #[id number]      CC:   Kathrin Greathouse, MD

## 2012-03-30 NOTE — Op Note (Unsigned)
DATE OF BIRTH:                        [birth year]      ADMISSION DATE:                     02/06/2004            PATIENT LOCATION:                     END END 22            DATE OF PROCEDURE:                   02/06/2004      SURGEON:                            Wyvonnia Lora, MD      ASSISTANT(S):                  PREOPERATIVE DIAGNOSIS:  COLON CANCER SCREENING.            POSTOPERATIVE DIAGNOSIS:  DIVERTICULOSIS.  OTHERWISE, NORMAL COLON.            PROCEDURE:  COLONOSCOPY.            DESCRIPTION OF PROCEDURE:  The patient underwent a standard Fleet      Phospho-Soda prep.  An IV line was placed in his arm, and he was      premedicated with 4 mg of Versed and 75 mcg of fentanyl.  The colonoscope      was inserted without much difficulty to the cecum where the usual landmarks      were identified.  The colon was adequately prepped.  There were multiple      diverticula seen in the left side of the colon, but otherwise the exam was      normal with no polyps.  The patient tolerated the procedure well.                                                ___________________________________          Date Signed: __________      Wyvonnia Lora, MD  ([id number])            D: 02/06/2004 by Wyvonnia Lora, MD      T: 02/07/2004 by [id number] ([id number]) ([id number])      cc:  Wyvonnia Lora, MD

## 2014-04-18 DIAGNOSIS — H31001 Unspecified chorioretinal scars, right eye: Secondary | ICD-10-CM | POA: Diagnosis not present

## 2014-04-18 DIAGNOSIS — H524 Presbyopia: Secondary | ICD-10-CM | POA: Diagnosis not present

## 2014-04-18 DIAGNOSIS — H40023 Open angle with borderline findings, high risk, bilateral: Secondary | ICD-10-CM | POA: Diagnosis not present

## 2014-04-18 DIAGNOSIS — Z961 Presence of intraocular lens: Secondary | ICD-10-CM | POA: Diagnosis not present

## 2014-05-01 DIAGNOSIS — R4189 Other symptoms and signs involving cognitive functions and awareness: Secondary | ICD-10-CM | POA: Diagnosis not present

## 2014-05-21 DIAGNOSIS — H5712 Ocular pain, left eye: Secondary | ICD-10-CM | POA: Diagnosis not present

## 2014-06-06 DIAGNOSIS — F341 Dysthymic disorder: Secondary | ICD-10-CM | POA: Diagnosis not present

## 2014-06-07 ENCOUNTER — Ambulatory Visit
Admission: RE | Admit: 2014-06-07 | Discharge: 2014-06-07 | Disposition: A | Discharge status: Home / Self Care | Payer: Medicare Other | Source: Ambulatory Visit | Attending: Psychiatry | Admitting: Psychiatry

## 2014-06-07 DIAGNOSIS — E033 Postinfectious hypothyroidism: Secondary | ICD-10-CM | POA: Insufficient documentation

## 2014-06-07 LAB — TSH: TSH: 2.67 u[IU]/mL (ref 0.35–4.94)

## 2014-06-09 ENCOUNTER — Ambulatory Visit
Admission: RE | Admit: 2014-06-09 | Discharge: 2014-06-09 | Disposition: A | Discharge status: Home / Self Care | Payer: Medicare Other | Source: Ambulatory Visit | Attending: Psychiatry | Admitting: Psychiatry

## 2014-06-09 ENCOUNTER — Other Ambulatory Visit: Payer: Self-pay | Admitting: Psychiatry

## 2014-06-09 DIAGNOSIS — G3189 Other specified degenerative diseases of nervous system: Secondary | ICD-10-CM | POA: Insufficient documentation

## 2014-06-09 DIAGNOSIS — R413 Other amnesia: Secondary | ICD-10-CM

## 2014-06-09 LAB — I-STAT CREATININE: Creatinine I-Stat: 1.2 mg/dL (ref 0.6–1.5)

## 2014-06-09 MED ORDER — IODIXANOL 320 MG/ML IV SOLN
INTRAVENOUS | Status: AC
Start: 2014-06-09 — End: 2014-06-09
  Administered 2014-06-09: 100 mL via INTRAVENOUS
  Filled 2014-06-09: qty 100

## 2014-06-30 DIAGNOSIS — H9319 Tinnitus, unspecified ear: Secondary | ICD-10-CM | POA: Diagnosis not present

## 2014-06-30 DIAGNOSIS — H905 Unspecified sensorineural hearing loss: Secondary | ICD-10-CM | POA: Diagnosis not present

## 2014-06-30 DIAGNOSIS — H6120 Impacted cerumen, unspecified ear: Secondary | ICD-10-CM | POA: Diagnosis not present

## 2014-07-01 DIAGNOSIS — R7989 Other specified abnormal findings of blood chemistry: Secondary | ICD-10-CM | POA: Diagnosis not present

## 2014-07-01 DIAGNOSIS — E039 Hypothyroidism, unspecified: Secondary | ICD-10-CM | POA: Diagnosis not present

## 2014-07-08 DIAGNOSIS — R4189 Other symptoms and signs involving cognitive functions and awareness: Secondary | ICD-10-CM | POA: Diagnosis not present

## 2014-07-08 DIAGNOSIS — R7989 Other specified abnormal findings of blood chemistry: Secondary | ICD-10-CM | POA: Diagnosis not present

## 2014-07-11 DIAGNOSIS — F341 Dysthymic disorder: Secondary | ICD-10-CM | POA: Diagnosis not present

## 2014-07-17 DIAGNOSIS — R252 Cramp and spasm: Secondary | ICD-10-CM | POA: Diagnosis not present

## 2014-07-17 DIAGNOSIS — R269 Unspecified abnormalities of gait and mobility: Secondary | ICD-10-CM | POA: Diagnosis not present

## 2014-08-08 DIAGNOSIS — F341 Dysthymic disorder: Secondary | ICD-10-CM | POA: Diagnosis not present

## 2014-08-28 DIAGNOSIS — Z961 Presence of intraocular lens: Secondary | ICD-10-CM | POA: Diagnosis not present

## 2014-08-28 DIAGNOSIS — H40013 Open angle with borderline findings, low risk, bilateral: Secondary | ICD-10-CM | POA: Diagnosis not present

## 2014-08-28 DIAGNOSIS — H47212 Primary optic atrophy, left eye: Secondary | ICD-10-CM | POA: Diagnosis not present

## 2014-10-17 DIAGNOSIS — F341 Dysthymic disorder: Secondary | ICD-10-CM | POA: Diagnosis not present

## 2014-10-22 DIAGNOSIS — Z961 Presence of intraocular lens: Secondary | ICD-10-CM | POA: Diagnosis not present

## 2014-10-22 DIAGNOSIS — H40023 Open angle with borderline findings, high risk, bilateral: Secondary | ICD-10-CM | POA: Diagnosis not present

## 2014-11-06 ENCOUNTER — Encounter (INDEPENDENT_AMBULATORY_CARE_PROVIDER_SITE_OTHER): Payer: Self-pay | Admitting: Neurology

## 2014-11-06 ENCOUNTER — Ambulatory Visit (INDEPENDENT_AMBULATORY_CARE_PROVIDER_SITE_OTHER): Payer: Medicare Other | Admitting: Neurology

## 2014-11-06 VITALS — BP 134/74 | HR 64 | Ht 71.0 in | Wt 195.0 lb

## 2014-11-06 DIAGNOSIS — F32A Depression, unspecified: Secondary | ICD-10-CM | POA: Insufficient documentation

## 2014-11-06 DIAGNOSIS — R270 Ataxia, unspecified: Secondary | ICD-10-CM | POA: Insufficient documentation

## 2014-11-06 DIAGNOSIS — I10 Essential (primary) hypertension: Secondary | ICD-10-CM | POA: Insufficient documentation

## 2014-11-06 DIAGNOSIS — F09 Unspecified mental disorder due to known physiological condition: Secondary | ICD-10-CM

## 2014-11-06 DIAGNOSIS — F329 Major depressive disorder, single episode, unspecified: Secondary | ICD-10-CM

## 2014-11-06 MED ORDER — ESCITALOPRAM OXALATE 10 MG PO TABS
10.0000 mg | ORAL_TABLET | Freq: Every day | ORAL | Status: AC
Start: 2014-11-06 — End: ?

## 2014-11-06 NOTE — Patient Instructions (Signed)
Our plan:     1) MRI brain    2) Blood work as written    3) Complete formal neuropsychological testing  -- Please call [facility] to schedule.   Their contact info is:  [phone]  [address]  [place], [place] [address]    4) Begin Lexapro at 10mg  daily    Return to clinic 6 weeks.    Today's Visit:      In today's visit I reviewed your medications and records relating your health - prior testing, blood work, reports of other health care providers present in your electronic medical record.     If you have records from non-[facility] doctors please send to [place] or bring to next office visit.     A copy of today's visit will be sent to your referring doctor and/or primary care doctor.    Let me know if there are things we could have done better for your office visit.    Patient satisfaction survey:      If you receive a patient satisfaction survey, I would greatly appreciate it if you would complete it.     We are like a car dealer where if the score isn't 9 or 10, it does not count, so if you had a good experience today, please let us know.  We value your feedback.     Contact me online:      Patient Portal online - Please sign up for MyChart -- this is the best way to communicate me.  There is a messaging feature which can send messages directly to my inbox.  It is the best way to communicate with me and get test results, medication refills or ask questions.     You can expect to get a response within 24 hours during weekdays - if you do not, resend your request and inform us we did not respond. My goal is for every question answered ever day.  Average response for a phone call is 3 days due to the volume we receive (which is why MyChart is preferred).    MyChart should not be used if you are having a medical emergency -- call 911 in that case.     Coupons for medication:      If you would like to see if samples or vouchers available for your medicines please consider checking  online.  Most medicines have web sites where you can print coupons or vouchers for you co-pay.     For example: AutomobileBuzz.is,  DSLRemote.se, Namendaxr.com, http://[email or web address]/, [email or web address], [email or web address].[email or web address]    Thank you for trusting me with your health.      Take care,      Eliannah Hinde "Johnston Ebbs, MD  Co-Director  Movement Disorders Specialist  [facility]  [facility]    [email or web address]

## 2014-11-10 NOTE — Progress Notes (Signed)
Subjective:      Patient ID: SAUNDERS [place] is a 81 y.o. male.    HPI     81 y/o M with hx HTN, HLD, hypothyroidism, depression/anxiety, cognitive disorder, presents to Neurology clinic for evaluation of cognitive change.    Pt is with his wife. They report first noticing cognitive issues around 2.5 yrs ago after the death of his daughter.  He had significant emotional stress, depression and extended grieving.  They feel there has ben an overall decline in his cognitive performance since that time. The wife reports difficulty with short term memory, as well as a quick move to becoming very angry.  She says that he 'flies off the handle' at times.  Easy to anger. She also describes having to repeat questions, and that he has to 're-associate' people and places into his memory.  No forgetting people, no getting lost, no red flags for safety issues.  Has been on Aricept 10mg  daily for 6 mo and noticed no change.  He does continue to suffer from depression/anxiety.    No family history of Alzheimer's or dementia.    The following portions of the patient's history were reviewed and updated as appropriate: allergies, current medications, past family history, past medical history, past surgical history and problem list.    Review of Systems  + memory loss  No recent illness. Denies fever, chills, cough, sinus pain, eye pain, eye redness, ear pain, rhinorrhea, sore throat, chest pain, SOB, wheezing, abd pain, Nausea, Vomiting, diarrhea, constipation, dysuria, or rashes. All other systems reviewed and are negative except as previously noted in the HPI.    Objective:   Neurologic Exam  Constitutional: NAD   Eyes: Clear conjunctiva  Cardiovascular: RRR  Resipratory: CTA B   Musculoskeletal: Normal posture and muscle bulk.  Integumentary: No abnormal rash noted  Psych: Normal affect    Neurological:   Mental Status: Alert & oriented to person, place, month, & year. MOCA 23/30 (10/2014)  Language: Spontaneous & fluent speech,  good comprehension.    Cranial Nerves:  II, III: PERRL  III, IV, VI: EOMI, No nystagmus, no palsies, no ptosis  V: Intact to LT V1-V3 distribution bilaterally.   VI: Symmetrical face and expression.   VIII: Hearing intact to finger rub bilaterally.   IX, X: Palate/Uvula elevates symmetrically.   XI: 5/5 Trapezius & SCM bilaterally.   XII: Tongue is midline.     Motor Exam: Normal bulk and tone. No clonus. No pronator drift. Strength 5/5 throughout and symmetric.    Sensation: Sensation to LT intact grossly through symmetrically. Romberg negative    Cerebellar:   Finger to Nose: intact bilaterally    Reflexes: 2+ throughout, symmetric, with down-going toes.    Station/ Gait: Well balanced and stable. Normal foot width. Normal arm swing.    + tremulous bilaterally in arms on positioning, none at rest.  Mild possible asterixis bilaterally.     Assessment:     81 y/o M with hx HTN, HLD, hypothyroidism, depression/anxiety, cognitive disorder, presents to Neurology clinic for evaluation of cognitive change. No red flags to suggest Alzheimer's or other dementia conditions at this time.  His complaints appear mild in the category of memory loss, and his MOCA was low likely to a degree due to focus and effort (later was able to remember 3/5 items and repeat, which would have made his MOCA normal).  There is likely a component of depression/anxiety at play which is driving focus and attention, as  well as causing a pseduo-dementia with an anger component.     Because of how concerned they are, the plan will be on one hand to work up his cognitive complaints with bloodwork, MRI brain and formal neuropsychological testing.  Will continue Aricept 10mg  daily for now. I'm also going to start Lexapro at 10mg  daily to address the depression overlay.  He is to return to clinic 4 wks after his neuropsych testing to review.    Plan:     As above    RTC in 4 wks after neuropsych testing. Patient can follow up sooner if needed. In the  meantime, patient will contact the office with any questions or concerns.     -----------------------------------------------------------    Additional notes and data scanned including patient questionnaire which may contain pertinent information to visit.     More than 50% of this 60 min office visit was spent counseling the patient on one or more of the following:   Disease state - discussion about the medical condition, diagnostic and treatment options   Medications - indications and side effects   Lifestyle issues as appropriate.       Nickole Adamek "Johnston Ebbs, MD  Co-Director  Movement Disorders Specialist  [facility]  [facility]    [address]  [address]  [place], [address]    [place], [place] [address]  T [phone]  F [phone]   T [phone]  F [phone]     [email or web address]

## 2014-11-14 ENCOUNTER — Ambulatory Visit
Admission: RE | Admit: 2014-11-14 | Discharge: 2014-11-14 | Disposition: A | Discharge status: Home / Self Care | Payer: Medicare Other | Source: Ambulatory Visit | Attending: Neurology | Admitting: Neurology

## 2014-11-14 DIAGNOSIS — R4189 Other symptoms and signs involving cognitive functions and awareness: Secondary | ICD-10-CM | POA: Insufficient documentation

## 2014-11-14 DIAGNOSIS — F09 Unspecified mental disorder due to known physiological condition: Secondary | ICD-10-CM

## 2014-11-14 DIAGNOSIS — I998 Other disorder of circulatory system: Secondary | ICD-10-CM | POA: Insufficient documentation

## 2014-11-14 DIAGNOSIS — F32A Depression, unspecified: Secondary | ICD-10-CM

## 2014-11-14 DIAGNOSIS — F329 Major depressive disorder, single episode, unspecified: Secondary | ICD-10-CM | POA: Insufficient documentation

## 2014-11-14 DIAGNOSIS — E785 Hyperlipidemia, unspecified: Secondary | ICD-10-CM | POA: Diagnosis not present

## 2014-11-14 DIAGNOSIS — I10 Essential (primary) hypertension: Secondary | ICD-10-CM | POA: Diagnosis not present

## 2014-11-14 DIAGNOSIS — G9389 Other specified disorders of brain: Secondary | ICD-10-CM | POA: Diagnosis not present

## 2014-12-16 ENCOUNTER — Emergency Department: Payer: Medicare Other

## 2014-12-16 ENCOUNTER — Emergency Department
Admission: EM | Admit: 2014-12-16 | Discharge: 2014-12-16 | Disposition: A | Discharge status: Home / Self Care | Payer: Medicare Other | Attending: Emergency Medicine | Admitting: Emergency Medicine

## 2014-12-16 DIAGNOSIS — I10 Essential (primary) hypertension: Secondary | ICD-10-CM | POA: Insufficient documentation

## 2014-12-16 DIAGNOSIS — Z87891 Personal history of nicotine dependence: Secondary | ICD-10-CM | POA: Insufficient documentation

## 2014-12-16 DIAGNOSIS — S2232XA Fracture of one rib, left side, initial encounter for closed fracture: Secondary | ICD-10-CM | POA: Insufficient documentation

## 2014-12-16 DIAGNOSIS — X58XXXA Exposure to other specified factors, initial encounter: Secondary | ICD-10-CM | POA: Insufficient documentation

## 2014-12-16 DIAGNOSIS — Z7982 Long term (current) use of aspirin: Secondary | ICD-10-CM | POA: Insufficient documentation

## 2014-12-16 DIAGNOSIS — Z79899 Other long term (current) drug therapy: Secondary | ICD-10-CM | POA: Insufficient documentation

## 2014-12-16 DIAGNOSIS — F0390 Unspecified dementia without behavioral disturbance: Secondary | ICD-10-CM | POA: Insufficient documentation

## 2014-12-16 DIAGNOSIS — R0781 Pleurodynia: Secondary | ICD-10-CM | POA: Diagnosis not present

## 2014-12-16 DIAGNOSIS — R109 Unspecified abdominal pain: Secondary | ICD-10-CM | POA: Diagnosis not present

## 2014-12-16 LAB — URINALYSIS, REFLEX TO MICROSCOPIC EXAM IF INDICATED
Bilirubin, UA: NEGATIVE
Blood, UA: NEGATIVE
Glucose, UA: NEGATIVE
Ketones UA: NEGATIVE
Leukocyte Esterase, UA: NEGATIVE
Nitrite, UA: NEGATIVE
Protein, UR: NEGATIVE
Specific Gravity UA: 1.005 (ref 1.001–1.035)
Urine pH: 6 (ref 5.0–8.0)
Urobilinogen, UA: NORMAL mg/dL

## 2014-12-16 MED ORDER — IBUPROFEN 600 MG PO TABS
600.0000 mg | ORAL_TABLET | Freq: Once | ORAL | Status: AC
Start: 2014-12-16 — End: 2014-12-16
  Administered 2014-12-16: 600 mg via ORAL
  Filled 2014-12-16: qty 1

## 2014-12-16 MED ORDER — IBUPROFEN 600 MG PO TABS
600.0000 mg | ORAL_TABLET | Freq: Four times a day (QID) | ORAL | Status: AC | PRN
Start: 2014-12-16 — End: 2014-12-23

## 2014-12-16 NOTE — Special Discharge Instructions (Addendum)
Please request a bone density scan with primary care doctor as you have rib tenderness with minor unremembered trauma.   Please use incentive spirometer once per hour.

## 2014-12-16 NOTE — Discharge Instructions (Signed)
Rib Fracture    You have been diagnosed with a rib fracture ("broken rib").    Fracture means "broken bone." Rib fractures are broken ribs. When there is a rib fracture, muscles between the ribs are also likely bruised. This is called a chest wall contusion. Both conditions are painful. This is because every breath moves the injured area. The conditions are not dangerous on their own. Sometimes complications happen. These include pneumonia or a collapsed lung. Rib fractures take 4 to 8 weeks to heal. Your pain should go down over this time.    Do not bind or tape your ribs. Binding or taping them may help with the pain. However, it makes the risk of pneumonia worse.    Cough and deep breathe at least 10 times an hour while awake. Do this even if it is painful. Support the area with a pillow or your hand. This will help with the pain. Use pain medicine as prescribed. This will help with the pain. You will be able to breathe normally. This will help you do the coughing and deep-breathing exercises. These exercises can help prevent pneumonia.    YOU SHOULD SEEK MEDICAL ATTENTION IMMEDIATELY, EITHER HERE OR AT THE NEAREST EMERGENCY DEPARTMENT, IF ANY OF THE FOLLOWING OCCURS:   Shortness of breath (wheezing or trouble breathing).   Coughing up green or yellow material.   Fever (temperature higher than 100.4F / 38C) or any fever that doesn't go away.   Severe chest pain or pain that suddenly gets worse.   No improvement in the next few days.

## 2014-12-16 NOTE — ED Notes (Signed)
Patient c/o left flank one week prior and has progressively worsened. Two Ibuprofen tablets taken at ten o'clock this morning without relief.

## 2014-12-18 ENCOUNTER — Ambulatory Visit (INDEPENDENT_AMBULATORY_CARE_PROVIDER_SITE_OTHER): Payer: Medicare Other | Admitting: Neurology

## 2014-12-18 ENCOUNTER — Encounter (INDEPENDENT_AMBULATORY_CARE_PROVIDER_SITE_OTHER): Payer: Self-pay | Admitting: Neurology

## 2014-12-18 VITALS — BP 121/62 | HR 74

## 2014-12-18 DIAGNOSIS — F09 Unspecified mental disorder due to known physiological condition: Secondary | ICD-10-CM

## 2014-12-18 DIAGNOSIS — F329 Major depressive disorder, single episode, unspecified: Secondary | ICD-10-CM

## 2014-12-18 DIAGNOSIS — F32A Depression, unspecified: Secondary | ICD-10-CM

## 2014-12-18 NOTE — Progress Notes (Signed)
Subjective:      Patient ID: JESIAH GRISMER is a 81 y.o. male.    HPI     82 y/o M with hx HTN, HLD, hypothyroidism, depression/anxiety, cognitive disorder, presents to Neurology clinic for f/u of cognitive change.    Feels mood is better, less anger outbursts.  Still with cognitive issues at times, but slightly better than before.     Pt is with his wife. They report first noticing cognitive issues around 2.5 yrs ago after the death of his daughter.  He had significant emotional stress, depression and extended grieving.  They feel there has ben an overall decline in his cognitive performance since that time. The wife reports difficulty with short term memory, as well as a quick move to becoming very angry.  She says that he 'flies off the handle' at times.  Easy to anger. She also describes having to repeat questions, and that he has to 're-associate' people and places into his memory.  No forgetting people, no getting lost, no red flags for safety issues.  Has been on Aricept 10mg  daily for 6 mo and noticed no change.  He does continue to suffer from depression/anxiety.    No family history of Alzheimer's or dementia.    The following portions of the patient's history were reviewed and updated as appropriate: allergies, current medications, past family history, past medical history, past surgical history and problem list.    Review of Systems  + memory loss  No recent illness. Denies fever, chills, cough, sinus pain, eye pain, eye redness, ear pain, rhinorrhea, sore throat, chest pain, SOB, wheezing, abd pain, Nausea, Vomiting, diarrhea, constipation, dysuria, or rashes. All other systems reviewed and are negative except as previously noted in the HPI.    Objective:   Neurologic Exam  Constitutional: NAD   Eyes: Clear conjunctiva  Cardiovascular: RRR  Resipratory: CTA B   Musculoskeletal: Normal posture and muscle bulk.  Integumentary: No abnormal rash noted  Psych: Normal affect    Neurological:   Mental Status:  Alert & oriented to person, place, month, & year. MOCA 23/30 (10/2014)  Language: Spontaneous & fluent speech, good comprehension.    Cranial Nerves:  II, III: PERRL  III, IV, VI: EOMI, No nystagmus, no palsies, no ptosis  V: Intact to LT V1-V3 distribution bilaterally.   VI: Symmetrical face and expression.   VIII: Hearing intact to finger rub bilaterally.   IX, X: Palate/Uvula elevates symmetrically.   XI: 5/5 Trapezius & SCM bilaterally.   XII: Tongue is midline.     Motor Exam: Normal bulk and tone. No clonus. No pronator drift. Strength 5/5 throughout and symmetric.    Sensation: Sensation to LT intact grossly through symmetrically. Romberg negative    Cerebellar:   Finger to Nose: intact bilaterally    Reflexes: 2+ throughout, symmetric, with down-going toes.    Station/ Gait: Well balanced and stable. Normal foot width. Normal arm swing.    + tremulous bilaterally in arms on positioning, none at rest.  Mild possible asterixis bilaterally.     Assessment:     81 y/o M with hx HTN, HLD, hypothyroidism, depression/anxiety, cognitive disorder, presents to Neurology clinic for evaluation of cognitive change. No red flags to suggest Alzheimer's or other dementia conditions at this time.  His complaints appear mild in the category of memory loss, and his MOCA was low likely to a degree due to focus and effort (later was able to remember 3/5 items and repeat, which  would have made his MOCA normal).  There is likely a component of depression/anxiety at play which is driving focus and attention, as well as causing a pseduo-dementia with an anger component. This is better on Lexapro but still present.     MRI and blood work were unrevealing.  Neuropsych testing is planned for the beginning of next month.  They are then moving to [place].     Plan:     As above    RTC when visiting [place] area.     -----------------------------------------------------------    Additional notes and data scanned including patient questionnaire which  may contain pertinent information to visit.     More than 50% of this 25 min office visit was spent counseling the patient on one or more of the following:   Disease state - discussion about the medical condition, diagnostic and treatment options   Medications - indications and side effects   Lifestyle issues as appropriate.       Kemal Amores "Johnston Ebbs, MD  Co-Director  Movement Disorders Specialist  [facility]  [facility]    [address]  [address]  [place] [address]    [place], [place] [address]  T [phone]  F [phone]   T [phone]  F [phone]     [email or web address]

## 2014-12-19 DIAGNOSIS — Z23 Encounter for immunization: Secondary | ICD-10-CM | POA: Diagnosis not present

## 2014-12-19 DIAGNOSIS — F329 Major depressive disorder, single episode, unspecified: Secondary | ICD-10-CM | POA: Diagnosis not present

## 2014-12-19 DIAGNOSIS — S2232XA Fracture of one rib, left side, initial encounter for closed fracture: Secondary | ICD-10-CM | POA: Diagnosis not present

## 2014-12-19 DIAGNOSIS — R4189 Other symptoms and signs involving cognitive functions and awareness: Secondary | ICD-10-CM | POA: Diagnosis not present

## 2014-12-19 NOTE — ED Provider Notes (Signed)
Physician/Midlevel provider first contact with patient: 12/16/14 1956         History     Chief Complaint   Patient presents with   . Flank Pain     HPI   81 yo male with hx of htn and onset of dementia with short term memory loss.   Presents with one week of worsening pain to the lft flank, worse with movement, sharp, constant. Moderate.  With no known trauma but does have memory problems. With no dysuria, no hematuria, no polyuria, no fever. No leg pain., no changes in strength or sensation. No abdominal pain. No n/v/d.    Pt denies changes in vision, hearing, sensation, strength or coordination.  No head or neck pain. No anticoagulation. Pt denies chest pain, sob, abdominal pain, nausea, vomiting, diarrhea and other extremity pain.        Past Medical History   Diagnosis Date   . Broken arm    . Broken ankle        No past surgical history on file.    No family history on file.    Social  Social History   Substance Use Topics   . Smoking status: Former Games developer   . Smokeless tobacco: Never Used   . Alcohol Use: None       .     No Known Allergies    Home Medications     Last Medication Reconciliation Action:  Complete Jeannetta Ellis, RN 12/16/2014  8:47 PM                  amLODIPine (NORVASC) 5 MG tablet     Take 5 mg by mouth daily.     aspirin EC 81 MG EC tablet     Take 81 mg by mouth daily.     atorvastatin (LIPITOR) 10 MG tablet     Take 10 mg by mouth daily.     calcium carbonate (OS-CAL) 600 MG Tab tablet     Take 600 mg by mouth daily.     Cholecalciferol (VITAMIN D) 1000 UNIT tablet     Take 1,000 Units by mouth daily.     donepezil (ARICEPT) 10 MG tablet     Take 10 mg by mouth 2 (two) times daily.     escitalopram (LEXAPRO) 10 MG tablet     Take 1 tablet (10 mg total) by mouth daily.     levothyroxine (SYNTHROID, LEVOTHROID) 50 MCG tablet     Take 50 mcg by mouth Once a day at 6:00am.     Multiple Vitamins-Minerals (CENTRUM SILVER PO)     Take by mouth daily.     tamsulosin (FLOMAX) 0.4 MG  Cap     Take 0.4 mg by mouth Daily after dinner.           Review of Systems   Constitutional: Negative for fever and chills.   HENT: Negative for sore throat.    Eyes: Negative for visual disturbance.   Respiratory: Negative for cough and shortness of breath.    Cardiovascular: Negative for chest pain.   Gastrointestinal: Negative for nausea, vomiting, abdominal pain and diarrhea.   Endocrine: Negative for polyuria.   Genitourinary: Positive for flank pain. Negative for dysuria, frequency and hematuria.   Musculoskeletal: Positive for back pain. Negative for arthralgias, gait problem, neck pain and neck stiffness.   Skin: Negative for rash and wound.   Allergic/Immunologic: Negative for immunocompromised state.   Neurological: Negative for weakness, numbness  and headaches.   Hematological: Does not bruise/bleed easily.   Psychiatric/Behavioral: Negative for agitation.       Physical Exam    BP: 156/74 mmHg, Heart Rate: 62, Temp: 97.5 F (36.4 C), Resp Rate: 18, SpO2: 99 %, Weight: 88.451 kg    Physical Exam   Constitutional: He is oriented to person, place, and time. He appears well-developed and well-nourished. No distress.   HENT:   Head: Normocephalic and atraumatic.   Eyes: EOM are normal. Pupils are equal, round, and reactive to light.   Neck: Neck supple.   No spinal tenderness c to l   Cardiovascular: Normal rate, regular rhythm and normal heart sounds.  Exam reveals no gallop and no friction rub.    No murmur heard.  Pulmonary/Chest: Effort normal and breath sounds normal. No respiratory distress. He has no wheezes. He has no rales.   Abdominal: Soft. Bowel sounds are normal. He exhibits no distension. There is no tenderness. There is no rebound and no guarding.   Musculoskeletal: Normal range of motion. He exhibits tenderness. He exhibits no edema.   lft flank tenderness. Small bruise found overlying lower rib on lft.  This area is what is most tender.  Stressing this rib away from bruise and flank  reproduces pain. No crepitus.   No cva tenderness.    Neurological: He is alert and oriented to person, place, and time. No cranial nerve deficit. Coordination normal.   nml strength and sensation throughout.    Skin: Skin is warm and dry. No rash noted. No erythema.   Psychiatric: He has a normal mood and affect.         MDM and ED Course     ED Medication Orders     Start Ordered     Status Ordering Provider    12/16/14 1958 12/16/14 1957  ibuprofen (ADVIL,MOTRIN) tablet 600 mg   Once     Route: Oral  Ordered Dose: 600 mg     Last MAR action:  Given Makalyn Lennox S             MDM  Number of Diagnoses or Management Options  Closed fracture of one rib of left side, initial encounter: new and requires workup     Amount and/or Complexity of Data Reviewed  Clinical lab tests: ordered and reviewed  Tests in the radiology section of CPT: reviewed and ordered    Patient Progress  Patient progress: improved            Procedures    Clinical Impression & Disposition     Clinical Impression  Final diagnoses:   Closed fracture of one rib of left side, initial encounter    81 yo male with lft lower rib tenderness over flank with small area of bruising.   ua done due to flank pain and nml  cxr and rib series nml  Done due to possible rib fracture.   Rib series not sufficiently sensitive to r/o rib fracture. Clinical dx rib fracture most likely from unremembered trauma due to dementia,   Given ibu and pain improved  [place] with same, incentive spirometer and [place] with pmd f/u advised to have bone density scan as he had possible rib fracture from possible incidental trauma     ED Disposition     Discharge Leafy Ro discharge to home/self care.    Condition at disposition: Stable             Discharge Medication List as  of 12/16/2014  9:35 PM      START taking these medications    Details   ibuprofen (ADVIL,MOTRIN) 600 MG tablet Take 1 tablet (600 mg total) by mouth every 6 (six) hours as needed for Pain or Fever., Starting  12/16/2014, Until Tue 12/23/14, Print                         Daylene Posey, MD  12/19/14 [phone]

## 2014-12-24 ENCOUNTER — Other Ambulatory Visit: Payer: Self-pay | Admitting: Internal Medicine

## 2014-12-24 DIAGNOSIS — S2232XA Fracture of one rib, left side, initial encounter for closed fracture: Secondary | ICD-10-CM

## 2014-12-25 ENCOUNTER — Ambulatory Visit
Admission: RE | Admit: 2014-12-25 | Discharge: 2014-12-25 | Disposition: A | Discharge status: Home / Self Care | Payer: Medicare Other | Source: Ambulatory Visit | Attending: Internal Medicine | Admitting: Internal Medicine

## 2014-12-25 ENCOUNTER — Other Ambulatory Visit: Payer: Self-pay | Admitting: Internal Medicine

## 2014-12-25 DIAGNOSIS — M810 Age-related osteoporosis without current pathological fracture: Secondary | ICD-10-CM

## 2014-12-25 DIAGNOSIS — S2232XA Fracture of one rib, left side, initial encounter for closed fracture: Secondary | ICD-10-CM

## 2014-12-25 DIAGNOSIS — M8589 Other specified disorders of bone density and structure, multiple sites: Secondary | ICD-10-CM | POA: Diagnosis not present

## 2014-12-31 DIAGNOSIS — R413 Other amnesia: Secondary | ICD-10-CM | POA: Diagnosis not present

## 2015-01-02 DIAGNOSIS — E039 Hypothyroidism, unspecified: Secondary | ICD-10-CM | POA: Diagnosis not present

## 2015-01-05 ENCOUNTER — Encounter (INDEPENDENT_AMBULATORY_CARE_PROVIDER_SITE_OTHER): Payer: Self-pay | Admitting: Neurology

## 2015-01-05 ENCOUNTER — Ambulatory Visit (INDEPENDENT_AMBULATORY_CARE_PROVIDER_SITE_OTHER): Payer: Medicare Other | Admitting: Neurology

## 2015-01-05 VITALS — BP 130/72 | HR 68

## 2015-01-05 DIAGNOSIS — F329 Major depressive disorder, single episode, unspecified: Secondary | ICD-10-CM

## 2015-01-05 DIAGNOSIS — F32A Depression, unspecified: Secondary | ICD-10-CM

## 2015-01-05 DIAGNOSIS — F09 Unspecified mental disorder due to known physiological condition: Secondary | ICD-10-CM

## 2015-01-05 NOTE — Progress Notes (Signed)
Subjective:      Patient ID: JAIVEN GRAVELINE is a 81 y.o. male.    HPI     81 y/o M with hx HTN, HLD, hypothyroidism, depression/anxiety, cognitive disorder, presents to Neurology clinic for f/u of cognitive change.    Feels mood is better, less anger outbursts.  Still with cognitive issues at times, but slightly better than before. Continues to respond well to Lexapro at 10mg  daily and Aricept 10mg  BID.     Neuropsych testing completed, will be available tomorrow.    -------------------------------  History retained from initial consultation:    Pt is with his wife. They report first noticing cognitive issues around 2.5 yrs ago after the death of his daughter.  He had significant emotional stress, depression and extended grieving.  They feel there has ben an overall decline in his cognitive performance since that time. The wife reports difficulty with short term memory, as well as a quick move to becoming very angry.  She says that he 'flies off the handle' at times.  Easy to anger. She also describes having to repeat questions, and that he has to 're-associate' people and places into his memory.  No forgetting people, no getting lost, no red flags for safety issues.  Has been on Aricept 10mg  daily for 6 mo and noticed no change.  He does continue to suffer from depression/anxiety.    No family history of Alzheimer's or dementia.    The following portions of the patient's history were reviewed and updated as appropriate: allergies, current medications, past family history, past medical history, past surgical history and problem list.    Review of Systems  + memory loss  No recent illness. Denies fever, chills, cough, sinus pain, eye pain, eye redness, ear pain, rhinorrhea, sore throat, chest pain, SOB, wheezing, abd pain, Nausea, Vomiting, diarrhea, constipation, dysuria, or rashes. All other systems reviewed and are negative except as previously noted in the HPI.    Objective:   Neurologic Exam  Constitutional: NAD    Eyes: Clear conjunctiva  Cardiovascular: RRR  Resipratory: CTA B   Musculoskeletal: Normal posture and muscle bulk.  Integumentary: No abnormal rash noted  Psych: Normal affect    Neurological:   Mental Status: Alert & oriented to person, place, month, & year. MOCA 23/30 (10/2014)  Language: Spontaneous & fluent speech, good comprehension.    Cranial Nerves:  II, III: PERRL  III, IV, VI: EOMI, No nystagmus, no palsies, no ptosis  V: Intact to LT V1-V3 distribution bilaterally.   VI: Symmetrical face and expression.   VIII: Hearing intact to finger rub bilaterally.   IX, X: Palate/Uvula elevates symmetrically.   XI: 5/5 Trapezius & SCM bilaterally.   XII: Tongue is midline.     Motor Exam: Normal bulk and tone. No clonus. No pronator drift. Strength 5/5 throughout and symmetric.    Sensation: Sensation to LT intact grossly through symmetrically. Romberg negative    Cerebellar:   Finger to Nose: intact bilaterally    Reflexes: 2+ throughout, symmetric, with down-going toes.    Station/ Gait: Well balanced and stable. Normal foot width. Normal arm swing.    + tremulous bilaterally in arms on positioning, none at rest.  Mild possible asterixis bilaterally.     Assessment:     81 y/o M with hx HTN, HLD, hypothyroidism, depression/anxiety, cognitive disorder, presents to Neurology clinic for evaluation of cognitive change. No red flags to suggest Alzheimer's or other dementia conditions at this time.  His  complaints appear mild in the category of memory loss, and his MOCA was low likely to a degree due to focus and effort (later was able to remember 3/5 items and repeat, which would have made his MOCA normal).  There is likely a component of depression/anxiety at play which is driving focus and attention, as well as causing a pseduo-dementia with an anger component. This is better on Lexapro but still present.     MRI and blood work were unrevealing.  We discussed his symptoms at length, the multifactorial etiology  behind his symptoms, and the treatment plan moving forward.  Plan will be to discuss neuropsych testing when it arrives.    Plan:     As above    RTC when visiting [place] area.     -----------------------------------------------------------    Additional notes and data scanned including patient questionnaire which may contain pertinent information to visit.     More than 50% of this 25 min office visit was spent counseling the patient on one or more of the following:   Disease state - discussion about the medical condition, diagnostic and treatment options   Medications - indications and side effects   Lifestyle issues as appropriate.       Jaykob Minichiello "Johnston Ebbs, MD  Co-Director  Movement Disorders Specialist  [facility]  [facility]    [address]  [address]  [place] [address]    [place], [place] [address]  T [phone]  F [phone]   T [phone]  F [phone]     [email or web address]

## 2015-01-06 DIAGNOSIS — M8588 Other specified disorders of bone density and structure, other site: Secondary | ICD-10-CM | POA: Diagnosis not present

## 2015-01-06 DIAGNOSIS — I10 Essential (primary) hypertension: Secondary | ICD-10-CM | POA: Diagnosis not present

## 2015-01-06 DIAGNOSIS — E785 Hyperlipidemia, unspecified: Secondary | ICD-10-CM | POA: Diagnosis not present

## 2015-01-06 DIAGNOSIS — E039 Hypothyroidism, unspecified: Secondary | ICD-10-CM | POA: Diagnosis not present

## 2015-01-27 DIAGNOSIS — E7800 Pure hypercholesterolemia, unspecified: Secondary | ICD-10-CM | POA: Diagnosis not present

## 2015-01-27 DIAGNOSIS — E039 Hypothyroidism, unspecified: Secondary | ICD-10-CM | POA: Diagnosis not present

## 2015-01-27 DIAGNOSIS — Z1389 Encounter for screening for other disorder: Secondary | ICD-10-CM | POA: Diagnosis not present

## 2015-01-27 DIAGNOSIS — N401 Enlarged prostate with lower urinary tract symptoms: Secondary | ICD-10-CM | POA: Diagnosis not present

## 2015-01-27 DIAGNOSIS — Z6827 Body mass index (BMI) 27.0-27.9, adult: Secondary | ICD-10-CM | POA: Diagnosis not present

## 2015-01-27 DIAGNOSIS — F321 Major depressive disorder, single episode, moderate: Secondary | ICD-10-CM | POA: Diagnosis not present

## 2015-01-27 DIAGNOSIS — I10 Essential (primary) hypertension: Secondary | ICD-10-CM | POA: Diagnosis not present

## 2015-01-27 DIAGNOSIS — F0390 Unspecified dementia without behavioral disturbance: Secondary | ICD-10-CM | POA: Diagnosis not present

## 2015-01-29 ENCOUNTER — Ambulatory Visit (INDEPENDENT_AMBULATORY_CARE_PROVIDER_SITE_OTHER): Payer: Medicare Other | Admitting: Neurology

## 2015-01-29 ENCOUNTER — Encounter: Payer: Self-pay | Admitting: Neurology

## 2015-01-29 VITALS — BP 131/64 | HR 64 | Ht 71.0 in | Wt 198.6 lb

## 2015-01-29 DIAGNOSIS — I10 Essential (primary) hypertension: Secondary | ICD-10-CM | POA: Diagnosis not present

## 2015-01-29 DIAGNOSIS — G3184 Mild cognitive impairment, so stated: Secondary | ICD-10-CM

## 2015-01-29 DIAGNOSIS — F329 Major depressive disorder, single episode, unspecified: Secondary | ICD-10-CM

## 2015-01-29 DIAGNOSIS — E785 Hyperlipidemia, unspecified: Secondary | ICD-10-CM

## 2015-01-29 DIAGNOSIS — E038 Other specified hypothyroidism: Secondary | ICD-10-CM

## 2015-01-29 DIAGNOSIS — F32A Depression, unspecified: Secondary | ICD-10-CM

## 2015-01-29 MED ORDER — DONEPEZIL HCL 10 MG PO TABS: 10.0000 mg | ORAL_TABLET | Freq: Two times a day (BID) | ORAL | Status: DC

## 2015-01-29 MED ORDER — DONEPEZIL HCL 10 MG PO TABS: 10.0000 mg | ORAL_TABLET | Freq: Every day | ORAL | Status: DC

## 2015-01-29 NOTE — Progress Notes (Signed)
[facility]    Provider:  Dr Jaynee Eagles Referring Provider: Drenda Freeze, MD Primary Care Physician:  Haywood Pao, MD  CC:  Memory loss  HPI:  Spencer Howard is a 81 y.o. male here as a referral from Dr. Osborne Casco for for dementia. PMHx memory loss, HTN, HLD, depression, hypothyroidism. Per a review of records, He has been experiencing short-term memory loss. Wife here with patient and provides information. Patient does not recall conversations, repeatedly asks questions, forgets appointments and needs reminding every day of daily plans. He forgets to take his medications. He gets lost when he drives. He uses GPS now and wife is always with him. [place]. Wife manages the finances. Symptoms started 3 years ago and slow progression. He did experience significant depression after the loss of his daughter. A component of depression/anxiety was suspected. As such, a complete neuropsychological evaluation including labs and brain imaging was ordered to differentiate depression from a neurodegenerative disorder. Brain imaging and blood work were unrevealing and the MRI of the brain showed only "stable chronic small vessel changes". He was placed on Lexapro for depression. 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 A thorough neuropsychological evaluation was performed and mild cognitive impairment was diagnosed and not suggestive of dementia at this time. He is on Aricept. Further, the psychological issues is not the cause of his cognitive difficulties however it affects his quality of life and exacerbates existing cognitive deficits.  He just moved into the area and is here to establish care. He is using [employer]. He is a very pleasant and conversant gentleman here with his wife who provides just as much information as patient does. He says he sometimes asks for directions. He moved and this has been stressful but they are thrilled to be here  around family. Memory changes started 3 years ago. It has been very slowly progressive. More recent than remote memories. He does not know the plans for the day, he says the same things over and over again, asks the same questions over and over. He gets frutrated and swears more but this is not a change in personality, just a little worse than he has been in the past. He remebers in the 7th grade he used to get checks for self control. He is better with his depression. His pcp is managing his depression, he is better on the Lexapro. The other day he got really mad but again he has done this in the past.  One glass of wine a day. He has some bouts of frustration or anger. He denies inappropriate bouts of crying or laughing or other pseudobulbar symptoms. He does get angry but this is not something new.   Reviewed notes, labs and imaging from outside physicians, which showed: Neuropsychological evaluation was performed in October 2016. Spencer Howard profile of findings appears to be best characterized as mild cognitive impairment, amnestic type. He demonstrated mild difficulty with simple attention and performance on measures of psychomotor speed was variable. He was able to understand directions for most measures but he did have trouble comprehending instructions for some of the more complicated tasks. He did not demonstrate the disorientation, deficient abstract reasoning, concrete clock drawing, impaired categorical fluency, reduced judgment and poor mental flexibility often observed in people with cortical dementia. And although he experiences memory lapses and his uvula, he is not demonstrating generalized functional impairment. This despite his memory decline Mr. Lipper does not meet full criteria  for cortical dementia i.e. Alzheimer's disease at this time. However Spencer Howard demonstrated greater memory loss that is typical for a person of his age. Small vessel ischemia would not cause the circumscribed memory  deficits demonstrated by the patient. 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 Repeat neuropsychological assessment in 12 months is strongly recommended to assist with differential diagnosis. This is particularly important given that amnestic mild cognitive impairment is increasingly thought to represent the early stages of dementia. Worsening of cognitive function in the context of stable cerebrovascular and overall health would provide strong evidence of a developing dementia.   Review of Systems: Patient complains of symptoms per HPI as well as the following symptoms: memory loss, confusion, depression, anxiety, decreased energy, change in appetite, disinterest in avtivities.  Pertinent negatives per HPI. All others negative.     Social History   Social History  . Marital Status: Married    Spouse Name: susan  . Number of Children: 2  . Years of Education: 17   Occupational History  . Retired    Social History Main Topics  . Smoking status: Former Smoker    Quit date: 03/28/1969  . Smokeless tobacco: Not on file  . Alcohol Use: 0.0 oz/week    0 Standard drinks or equivalent per week     Comment: 4-6 oz wine  . Drug Use: No  . Sexual Activity: Not on file   Other Topics Concern  . Not on file   Social History Narrative   Lives at home with wife.    Caffeine use: 1-2 cups per day       Family History  Problem Relation Age of Onset  . Leukemia Father   . Colon cancer Mother   . Dementia Neg Hx     Past Medical History  Diagnosis Date  . Hypertension   . High cholesterol   . Depression     Past Surgical History  Procedure Laterality Date  . Varicocelectomy  1967  . Tonsillectomy  1942  . Elbow surgery Left 1995    Broken    Current Outpatient Prescriptions  Medication Sig Dispense Refill  . Alendronate Sodium (FOSAMAX PO) Take 1 tablet by mouth once a week.    Marland Kitchen amLODipine  (NORVASC) 5 MG tablet Take 5 mg by mouth daily.    Marland Kitchen aspirin 81 MG tablet Take 81 mg by mouth daily.    Marland Kitchen atorvastatin (LIPITOR) 10 MG tablet Take 10 mg by mouth daily.    . calcium carbonate (OSCAL) 1500 (600 CA) MG TABS tablet Take 1,500 mg by mouth daily.    . Cholecalciferol (VITAMIN D3) 10000 UNITS TABS Take 1 tablet by mouth daily.    Marland Kitchen donepezil (ARICEPT) 10 MG tablet Take 1 tablet (10 mg total) by mouth at bedtime. 180 tablet 3  . escitalopram (LEXAPRO) 20 MG tablet Take 20 mg by mouth daily.    Marland Kitchen levothyroxine (SYNTHROID, LEVOTHROID) 50 MCG tablet Take 50 mcg by mouth daily before breakfast.    . Multiple Vitamins-Minerals (MULTIVITAMIN ADULT PO) Take 1 tablet by mouth daily.    . tamsulosin (FLOMAX) 0.4 MG CAPS capsule Take 0.4 mg by mouth daily.     No current facility-administered medications for this visit.    Allergies as of 01/29/2015  . (Not on File)    Vitals: BP 131/64 mmHg  Pulse 64  Ht 5\' 11"  (1.803 m)  Wt 198 lb 9.6 oz (  90.084 kg)  BMI 27.71 kg/m2 Last Weight:  Wt Readings from Last 1 Encounters:  01/29/15 198 lb 9.6 oz (90.084 kg)   Last Height:   Ht Readings from Last 1 Encounters:  01/29/15 5\' 11"  (1.803 m)   Physical exam: Exam: Gen: NAD, conversant, well nourised, obese, well groomed                     CV: RRR, no MRG. No Carotid Bruits. No peripheral edema, warm, nontender Eyes: Conjunctivae clear without exudates or hemorrhage  Neuro: Detailed Neurologic Exam  Speech:    Speech is normal; fluent and spontaneous with normal comprehension.  Cognition: Montreal Cognitive Assessment  01/29/2015  Visuospatial/ Executive (0/5) 5  Naming (0/3) 2  Attention: Read list of digits (0/2) 2  Attention: Read list of letters (0/1) 1  Attention: Serial 7 subtraction starting at 100 (0/3) 3  Language: Repeat phrase (0/2) 1  Language : Fluency (0/1) 1  Abstraction (0/2) 2  Delayed Recall (0/5) 1  Orientation (0/6) 6  Total 24  Adjusted Score (based on  education) 24    Cranial Nerves:    The pupils are equal, round, and reactive to light. The fundi are normal and spontaneous venous pulsations are present. Visual fields are full to finger confrontation. Extraocular movements are intact. Trigeminal sensation is intact and the muscles of mastication are normal. The face is symmetric. The palate elevates in the midline. Hearing intact. Voice is normal. Shoulder shrug is normal. The tongue has normal motion without fasciculations.   Coordination:    Normal finger to nose and heel to shin. Normal rapid alternating movements.   Gait:    Heel-toe and tandem gait are normal.   Motor Observation:    No asymmetry, no atrophy, and no involuntary movements noted. Tone:    Normal muscle tone.    Posture:    Posture is normal. normal erect    Strength:    Strength is V/V in the upper and lower limbs.      Sensation: intact to LT     Reflex Exam:  DTR's:    Deep tendon reflexes in the upper and lower extremities are normal bilaterally.   Toes:    The toes are downgoing bilaterally.   Clonus:    Clonus is absent.       Assessment/Plan:   81 year old male here to establish care. Neuro exam is nonfocal. Extensive workup from previous neurologist and neuropsychological testing c/w MCI complicated by depression. MoCA today 24/30.   Anne Ng, MD recommended Aricept twice daily which is how patient has been taking Aricept. Will check with him to see if he did want patient on Aricept bid instead of qday. [phone]. Will change to once daily dosing.   Continue Aricept for MCI. MoCA 24/30, Namenda not indicated yet.   For his bouts of anger and rustration:  doesn't appear he is suffering from [place], but will continue to monitor as he is having more outbursts and agitation.   Will request records from Dr. Jeanene Erb in [place].   Repeat neuropsychological assessment in 12 months is strongly recommended to assist with differential  diagnosis. This is particularly important given that amnestic mild cognitive impairment is increasingly thought to represent the early stages of dementia. Worsening of cognitive function in the context of stable cerebrovascular and overall health would provide strong evidence of a developing dementia.  F/u 6 months, repeat MoCA. If decline, consider namenda. Follow on anger/outbursts and  monitor for PBA.   Sarina Ill, MD  [facility] [address] [place], [place] [address]  Phone [phone] Fax [phone]

## 2015-01-29 NOTE — Patient Instructions (Addendum)
Overall you are doing fairly well but I do want to suggest a few things today:   Remember to drink plenty of fluid, eat healthy meals and do not skip any meals. Try to eat protein with a every meal and eat a healthy snack such as fruit or nuts in between meals. Try to keep a regular sleep-wake schedule and try to exercise daily, particularly in the form of walking, 20-30 minutes a day, if you can.   As far as your medications are concerned, I would like to suggest: Continue current medications  I would like to see you back in 6 months, sooner if we need to. Please call us with any interim questions, concerns, problems, updates or refill requests.   Our phone number is [phone]. We also have an after hours call service for urgent matters and there is a physician on-call for urgent questions. For any emergencies you know to call 911 or go to the nearest emergency room

## 2015-02-01 ENCOUNTER — Encounter: Payer: Self-pay | Admitting: Neurology

## 2015-02-01 DIAGNOSIS — E039 Hypothyroidism, unspecified: Secondary | ICD-10-CM | POA: Insufficient documentation

## 2015-02-01 DIAGNOSIS — F32A Depression, unspecified: Secondary | ICD-10-CM | POA: Insufficient documentation

## 2015-02-01 DIAGNOSIS — F329 Major depressive disorder, single episode, unspecified: Secondary | ICD-10-CM | POA: Insufficient documentation

## 2015-02-01 DIAGNOSIS — I10 Essential (primary) hypertension: Secondary | ICD-10-CM | POA: Insufficient documentation

## 2015-02-01 DIAGNOSIS — G3184 Mild cognitive impairment, so stated: Secondary | ICD-10-CM | POA: Insufficient documentation

## 2015-02-01 DIAGNOSIS — E785 Hyperlipidemia, unspecified: Secondary | ICD-10-CM | POA: Insufficient documentation

## 2015-03-03 ENCOUNTER — Telehealth: Payer: Self-pay | Admitting: Neurology

## 2015-03-03 NOTE — Telephone Encounter (Signed)
Called pt back. Advised Dr Jaynee Eagles did not prescribe rx lexapro. He needs to contact MD who prescribed him medication. He verbalized understanding and appreciation.

## 2015-03-03 NOTE — Telephone Encounter (Signed)
Pt called and wants to know about increasing escitalopram (LEXAPRO) .  He states he is currently taking 10 mg once a day. He is a little depressed and says the holiday season has him down. Please call and advise [phone]

## 2015-03-06 DIAGNOSIS — F0390 Unspecified dementia without behavioral disturbance: Secondary | ICD-10-CM | POA: Diagnosis not present

## 2015-03-06 DIAGNOSIS — Z1389 Encounter for screening for other disorder: Secondary | ICD-10-CM | POA: Diagnosis not present

## 2015-03-06 DIAGNOSIS — H9193 Unspecified hearing loss, bilateral: Secondary | ICD-10-CM | POA: Diagnosis not present

## 2015-03-06 DIAGNOSIS — Z6827 Body mass index (BMI) 27.0-27.9, adult: Secondary | ICD-10-CM | POA: Diagnosis not present

## 2015-03-06 DIAGNOSIS — F321 Major depressive disorder, single episode, moderate: Secondary | ICD-10-CM | POA: Diagnosis not present

## 2015-06-11 DIAGNOSIS — F0390 Unspecified dementia without behavioral disturbance: Secondary | ICD-10-CM | POA: Diagnosis not present

## 2015-06-11 DIAGNOSIS — R2689 Other abnormalities of gait and mobility: Secondary | ICD-10-CM | POA: Diagnosis not present

## 2015-06-11 DIAGNOSIS — Z6827 Body mass index (BMI) 27.0-27.9, adult: Secondary | ICD-10-CM | POA: Diagnosis not present

## 2015-06-11 DIAGNOSIS — E038 Other specified hypothyroidism: Secondary | ICD-10-CM | POA: Diagnosis not present

## 2015-07-29 ENCOUNTER — Ambulatory Visit (INDEPENDENT_AMBULATORY_CARE_PROVIDER_SITE_OTHER): Payer: Medicare Other | Admitting: Neurology

## 2015-07-29 ENCOUNTER — Telehealth: Payer: Self-pay | Admitting: *Deleted

## 2015-07-29 ENCOUNTER — Encounter: Payer: Self-pay | Admitting: Neurology

## 2015-07-29 VITALS — BP 115/42 | HR 64 | Ht 71.0 in | Wt 194.4 lb

## 2015-07-29 DIAGNOSIS — R269 Unspecified abnormalities of gait and mobility: Secondary | ICD-10-CM | POA: Diagnosis not present

## 2015-07-29 DIAGNOSIS — E559 Vitamin D deficiency, unspecified: Secondary | ICD-10-CM | POA: Diagnosis not present

## 2015-07-29 DIAGNOSIS — R2689 Other abnormalities of gait and mobility: Secondary | ICD-10-CM

## 2015-07-29 DIAGNOSIS — E538 Deficiency of other specified B group vitamins: Secondary | ICD-10-CM | POA: Diagnosis not present

## 2015-07-29 DIAGNOSIS — F329 Major depressive disorder, single episode, unspecified: Secondary | ICD-10-CM | POA: Diagnosis not present

## 2015-07-29 DIAGNOSIS — Z9181 History of falling: Secondary | ICD-10-CM | POA: Diagnosis not present

## 2015-07-29 DIAGNOSIS — R5383 Other fatigue: Secondary | ICD-10-CM

## 2015-07-29 DIAGNOSIS — G3184 Mild cognitive impairment, so stated: Secondary | ICD-10-CM

## 2015-07-29 DIAGNOSIS — F32A Depression, unspecified: Secondary | ICD-10-CM

## 2015-07-29 MED ORDER — DIVALPROEX SODIUM ER 250 MG PO TB24: 250.0000 mg | ORAL_TABLET | Freq: Every day | ORAL | Status: DC

## 2015-07-29 MED ORDER — DONEPEZIL HCL 10 MG PO TABS: 10.0000 mg | ORAL_TABLET | Freq: Two times a day (BID) | ORAL | Status: DC

## 2015-07-29 NOTE — Progress Notes (Addendum)
[facility]    Provider:  Dr Jaynee Eagles Referring Provider: Osborne Casco Fransico Him, MD Primary Care Physician:  Haywood Pao, MD  CC: Memory loss  Interval History 07/29/2015: Short term memory is worsening. He is highly dependent on his watch. He forgets relationships of family members and grandchildren and great grandchildren. He is having balance problems, he staggers a lot, near falls, mostly in th emorning when he gets up, he gets up too fast and his blood pressure is low and gets dizzy. No numbness or tingling in the feet. Had recent bloodwork witll request from Dr. Osborne Casco.   HPI: Spencer Howard is a 82 y.o. male here as a referral from Dr. Osborne Casco for for dementia. PMHx memory loss, HTN, HLD, depression, hypothyroidism. Per a review of records, He has been experiencing short-term memory loss. Wife here with patient and provides information. Patient does not recall conversations, repeatedly asks questions, forgets appointments and needs reminding every day of daily plans. He forgets to take his medications. He gets lost when he drives. He uses GPS now and wife is always with him. [place] got [place] the golf [place]. Wife manages the finances. Symptoms started 3 years ago and slow progression. He did experience significant depression after the loss of his daughter. A component of depression/anxiety was suspected. As such, a complete neuropsychological evaluation including labs and brain imaging was ordered to differentiate depression from a neurodegenerative disorder. Brain imaging and blood work were unrevealing and the MRI of the brain showed only "stable chronic small vessel changes". He was placed on Lexapro for depression. 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 A thorough neuropsychological evaluation was performed and mild cognitive impairment was diagnosed and not suggestive of dementia at this time. He is on Aricept. Further, the psychological issues  is not the cause of his cognitive difficulties however it affects his quality of life and exacerbates existing cognitive deficits.  He just moved into the area and is here to establish care. He is using [employer]. He is a very pleasant and conversant gentleman here with his wife who provides just as much information as patient does. He says he sometimes asks for directions. He moved and this has been stressful but they are thrilled to be here around family. Memory changes started 3 years ago. It has been very slowly progressive. More recent than remote memories. He does not know the plans for the day, he says the same things over and over again, asks the same questions over and over. He gets frutrated and swears more but this is not a change in personality, just a little worse than he has been in the past. He remebers in the 7th grade he used to get checks for self control. He is better with his depression. His pcp is managing his depression, he is better on the Lexapro. The other day he got really mad but again he has done this in the past. One glass of wine a day. He has some bouts of frustration or anger. He denies inappropriate bouts of crying or laughing or other pseudobulbar symptoms. He does get angry but this is not something new.   Reviewed notes, labs and imaging from outside physicians, which showed: Neuropsychological evaluation was performed in October 2016. Mr. Spencer Howard profile of findings appears to be best characterized as mild cognitive impairment, amnestic type. He demonstrated mild difficulty with simple attention and performance on measures of psychomotor speed was variable. He was able to  understand directions for most measures but he did have trouble comprehending instructions for some of the more complicated tasks. He did not demonstrate the disorientation, deficient abstract reasoning, concrete clock drawing, impaired categorical fluency, reduced judgment and poor mental flexibility often  observed in people with cortical dementia. And although he experiences memory lapses and his uvula, he is not demonstrating generalized functional impairment. This despite his memory decline Mr. Vanotterloo does not meet full criteria for cortical dementia i.e. Alzheimer's disease at this time. However Mr. Rexroth demonstrated greater memory loss that is typical for a person of his age. Small vessel ischemia would not cause the circumscribed memory deficits demonstrated by the patient. 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 Repeat neuropsychological assessment in 12 months is strongly recommended to assist with differential diagnosis. This is particularly important given that amnestic mild cognitive impairment is increasingly thought to represent the early stages of dementia. Worsening of cognitive function in the context of stable cerebrovascular and overall health would provide strong evidence of a developing dementia.   Review of Systems: Patient complains of symptoms per HPI as well as the following symptoms: memory loss, confusion, depression, anxiety, decreased energy, change in appetite, disinterest in avtivities. Pertinent negatives per HPI. All others negative.    Social History   Social History  . Marital Status: Married    Spouse Name: Spencer Howard  . Number of Children: 2  . Years of Education: 17   Occupational History  . Retired    Social History Main Topics  . Smoking status: Former Smoker    Quit date: 03/28/1969  . Smokeless tobacco: Not on file  . Alcohol Use: 0.0 oz/week    0 Standard drinks or equivalent per week     Comment: 4-6 oz wine  . Drug Use: No  . Sexual Activity: Not on file   Other Topics Concern  . Not on file   Social History Narrative   Lives at home with wife.    Caffeine use: 1-2 cups per day       Family History  Problem Relation Age of Onset  . Leukemia Father   . Colon  cancer Mother   . Dementia Neg Hx     Past Medical History  Diagnosis Date  . Hypertension   . High cholesterol   . Depression     Past Surgical History  Procedure Laterality Date  . Varicocelectomy  1967  . Tonsillectomy  1942  . Elbow surgery Left 1995    Broken    Current Outpatient Prescriptions  Medication Sig Dispense Refill  . alendronate (FOSAMAX) 70 MG tablet Take 70 mg by mouth once a week.     Marland Kitchen amLODipine (NORVASC) 5 MG tablet Take 5 mg by mouth daily.    Marland Kitchen aspirin 81 MG tablet Take 81 mg by mouth daily.    Marland Kitchen atorvastatin (LIPITOR) 10 MG tablet Take 10 mg by mouth daily.    . calcium carbonate (OSCAL) 1500 (600 CA) MG TABS tablet Take 1,500 mg by mouth daily.    . Cholecalciferol (VITAMIN D3) 10000 UNITS TABS Take 1 tablet by mouth daily.    Marland Kitchen donepezil (ARICEPT) 10 MG tablet Take 1 tablet (10 mg total) by mouth at bedtime. 180 tablet 3  . escitalopram (LEXAPRO) 20 MG tablet Take 20 mg by mouth daily.    Marland Kitchen levothyroxine (SYNTHROID, LEVOTHROID) 50 MCG tablet Take 50 mcg by mouth daily before breakfast.    .  Multiple Vitamins-Minerals (MULTIVITAMIN ADULT PO) Take 1 tablet by mouth daily.    . tamsulosin (FLOMAX) 0.4 MG CAPS capsule Take 0.4 mg by mouth daily.     No current facility-administered medications for this visit.    Allergies as of 07/29/2015  . (No Known Allergies)    Vitals: BP 115/42 mmHg  Pulse 64  Ht 5\' 11"  (1.803 m)  Wt 194 lb 6.4 oz (88.179 kg)  BMI 27.13 kg/m2  SpO2 95% Last Weight:  Wt Readings from Last 1 Encounters:  07/29/15 194 lb 6.4 oz (88.179 kg)   Last Height:   Ht Readings from Last 1 Encounters:  07/29/15 5\' 11"  (1.803 m)     Cranial Nerves:  The pupils are equal, round, and reactive to light. The fundi are normal and spontaneous venous pulsations are present. Visual fields are full to finger confrontation. Extraocular movements are intact. Trigeminal sensation is intact and the muscles of mastication are normal. The  face is symmetric. The palate elevates in the midline. Hearing intact. Voice is normal. Shoulder shrug is normal. The tongue has normal motion without fasciculations.   Coordination:  Normal finger to nose and heel to shin. Normal rapid alternating movements.   Gait:  Heel-toe and tandem gait are normal.   Motor Observation:  No asymmetry, no atrophy, and no involuntary movements noted. Tone:  Normal muscle tone.   Posture:  Posture is normal. normal erect   Strength:  Strength is V/V in the upper and lower limbs.    Sensation: intact to LT   Reflex Exam:  DTR's:  Deep tendon reflexes in the upper and lower extremities are normal bilaterally.  Toes:  The toes are downgoing bilaterally.  Clonus:  Clonus is absent.      Assessment/Plan: 82 year old male here to establish care. Neuro exam is nonfocal. Extensive workup from previous neurologist and neuropsychological testing c/w MCI complicated by depression. Last  MoCA today 24/30. Here with wife of 29 years. - increase Aricept to bid - start depakote for behavior problems and anger -physical therapy  - repeat cognitive testing in one year - not time for namenda yet - Discussed research trials and mild cognitive impairment, patient will consider  Continue Aricept for MCI. MoCA 24/30, Namenda not indicated yet.   For his bouts of anger and rustration: doesn't appear he is suffering from [place], but will continue to monitor as he is having more outbursts and agitation. .Will start Depakote. Discussed the most common side effects of Depakote including serious reactions which can include hepatotoxicity, pancreatitis, hyponatremia, pancytopenia, thrombocytopenia and patient's wife denies any history of liver disease or electrolyte imbalances. She is to stop for any concerning symptoms especially rash, suicidality, psychosis and hallucinations., And reactions include headache, nausea vomiting,  somnolence, thrombocytopenia, dyspepsia, dizziness, diarrhea, abdominal pain, tremor, alopecia, weight changes, appetite changes, constipation and other side effects. Please can stop for anything concerning.  B12 collected 06/15/2015 678, CMP unremarkable except creatinine 1.3, TSH 3.13 normal  Will request records from Dr. Jeanene Erb in [place] again, did not receive.  Repeat neuropsychological assessment in 12 months is strongly recommended to assist with differential diagnosis. This is particularly important given that amnestic mild cognitive impairment is increasingly thought to represent the early stages of dementia. Worsening of cognitive function in the context of stable cerebrovascular and overall health would provide strong evidence of a developing dementia.  F/u 6 months, repeat MoCA. If decline, consider namenda. Follow on anger/outbursts and monitor for PBA.  Sarina Ill, MD  [facility] [address] [place], [place] [address]  Phone [phone] Fax [phone]  A total of 30 minutes was spent face-to-face with this patient. Over half this time was spent on counseling patient on the mild cognitive impairment diagnosis and different diagnostic and therapeutic options available.

## 2015-07-29 NOTE — Telephone Encounter (Signed)
Received results via fax from Larey Seat at [facility]. Labs completed on 06/11/15. Gave results to Dr Jaynee Eagles.

## 2015-07-29 NOTE — Patient Instructions (Signed)
Remember to drink plenty of fluid, eat healthy meals and do not skip any meals. Try to eat protein with a every meal and eat a healthy snack such as fruit or nuts in between meals. Try to keep a regular sleep-wake schedule and try to exercise daily, particularly in the form of walking, 20-30 minutes a day, if you can.   As far as your medications are concerned, I would like to suggest: Start Depakote 250mg  at night. Increase Aricept to 10mg  twice daily. Hold off starting until discussed with research team.  As far as diagnostic testing: Physical therapy  I would like to see you back in 6 months, sooner if we need to. Please call us with any interim questions, concerns, problems, updates or refill requests.   Our phone number is [phone]. We also have an after hours call service for urgent matters and there is a physician on-call for urgent questions. For any emergencies you know to call 911 or go to the nearest emergency room

## 2015-07-29 NOTE — Telephone Encounter (Signed)
I have called, Dr Hershal Coria office 4 times requesting medical records, each time the medical dept stating they have faxed the records twice.

## 2015-07-29 NOTE — Telephone Encounter (Signed)
Called Dr Osborne Casco office to receive copy of most recent labs done in their office per Dr Jaynee Eagles request. Spencer Howard to [place]. Spencer Howard is going to fax results to [phone], attn: Spencer Howard.

## 2015-07-30 DIAGNOSIS — H9193 Unspecified hearing loss, bilateral: Secondary | ICD-10-CM | POA: Diagnosis not present

## 2015-07-30 DIAGNOSIS — F0390 Unspecified dementia without behavioral disturbance: Secondary | ICD-10-CM | POA: Diagnosis not present

## 2015-07-30 DIAGNOSIS — N401 Enlarged prostate with lower urinary tract symptoms: Secondary | ICD-10-CM | POA: Diagnosis not present

## 2015-07-30 DIAGNOSIS — R2689 Other abnormalities of gait and mobility: Secondary | ICD-10-CM | POA: Diagnosis not present

## 2015-07-30 DIAGNOSIS — I10 Essential (primary) hypertension: Secondary | ICD-10-CM | POA: Diagnosis not present

## 2015-07-30 DIAGNOSIS — Z6826 Body mass index (BMI) 26.0-26.9, adult: Secondary | ICD-10-CM | POA: Diagnosis not present

## 2015-07-30 DIAGNOSIS — E7800 Pure hypercholesterolemia, unspecified: Secondary | ICD-10-CM | POA: Diagnosis not present

## 2015-07-30 DIAGNOSIS — F321 Major depressive disorder, single episode, moderate: Secondary | ICD-10-CM | POA: Diagnosis not present

## 2015-07-30 DIAGNOSIS — E038 Other specified hypothyroidism: Secondary | ICD-10-CM | POA: Diagnosis not present

## 2015-07-30 MED ORDER — DIVALPROEX SODIUM ER 250 MG PO TB24: 250.0000 mg | ORAL_TABLET | Freq: Every day | ORAL | Status: DC

## 2015-08-11 ENCOUNTER — Ambulatory Visit: Payer: Self-pay | Admitting: Physical Therapy

## 2015-08-17 ENCOUNTER — Ambulatory Visit: Payer: Medicare Other | Attending: Neurology | Admitting: Physical Therapy

## 2015-08-17 DIAGNOSIS — R2689 Other abnormalities of gait and mobility: Secondary | ICD-10-CM | POA: Diagnosis not present

## 2015-08-17 DIAGNOSIS — M6281 Muscle weakness (generalized): Secondary | ICD-10-CM | POA: Insufficient documentation

## 2015-08-17 NOTE — Patient Instructions (Signed)
SINGLE LIMB STANCE    Stance: single leg on floor. Raise leg. Hold 10___ seconds. Repeat with other leg. _3__ reps per set, __2_ sets per day, _5__ days per week  Copyright  VHI. All rights reserved.  ANKLE: Plantarflexion, Bilateral - Standing    Stand with upright posture. Raise heels up as high as possible. __10-15_ reps per set, _2-3__ sets per day, _5__ days per week Hold onto a support.  Copyright  VHI. All rights reserved.  Plantar Flexion: Resisted    Anchor behind, tubing around left foot, press down. Repeat __10-15__ times per set. Do _2-3___ sets per session. Do __2__ sessions per day.  http://orth.exer.us/10   Copyright  VHI. All rights reserved.

## 2015-08-17 NOTE — Therapy (Signed)
[facility] [address] [place], [place], [address] Phone: [phone]   Fax:  [phone]  Physical Therapy Evaluation  Patient Details  Name: Spencer Howard MRN: [id number] Date of Birth: 30-Mar-1928 Referring Provider: Dr. Sarina Ill  Encounter Date: 08/17/2015      PT End of Session - 08/17/15 1336    Visit Number 1   Number of Visits 5   Date for PT Re-Evaluation 09/17/15   Authorization Type Medicare   Authorization Time Period 08-17-15 - 10-16-15   PT Start Time 0831   PT Stop Time 0930   PT Time Calculation (min) 59 min      Past Medical History  Diagnosis Date  . Hypertension   . High cholesterol   . Depression     Past Surgical History  Procedure Laterality Date  . Varicocelectomy  1967  . Tonsillectomy  1942  . Elbow surgery Left 1995    Broken    There were no vitals filed for this visit.       Subjective Assessment - 08/17/15 1315    Subjective Pt reports his walking pattern is the worst in the morning when he gets up; he "stomps" the left leg per wilfe's report, but doesn't know why he does this   Patient is accompained by: Family member  wife - [name]   Pertinent History memory loss and depression started after death of his daughter 4 yrs ago; pt and wife moved from [place], [place] in Oct. 2016   Patient Stated Goals Improve walking and balance   Currently in Pain? No/denies            [facility] PT Assessment - 08/17/15 0844    Assessment   Medical Diagnosis Gait abnormality   Referring Provider Dr. Sarina Ill   Onset Date/Surgical Date --  approx. 3 months ago   Precautions   Precautions None   Balance Screen   Has the patient fallen in the past 6 months Yes   How many times? 2   Has the patient had a decrease in activity level because of a fear of falling?  No   Is the patient reluctant to leave their home because of a fear of falling?  No   Home Environment   Living  Environment Private residence   Type of [place] Access Stairs to enter   Entrance Stairs-Number of Steps 4   Entrance Stairs-Rails Right   Home Layout Two level   Alternate Level Stairs-Number of Steps 12   Home Equipment Grab bars - tub/shower   Prior Function   Level of Independence Independent with basic ADLs;Independent with community mobility without device;Independent with household mobility without device   Cognition   Overall Cognitive Status Impaired/Different from baseline   Strength   Overall Strength Comments strength is 5/5 with exception of R gastrocnemius which is 2/5 with pt unable to plantarflex on RLE in R unilateral stance   Ambulation/Gait   Ambulation/Gait Yes   Ambulation/Gait Assistance 6: Modified independent (Device/Increase time)   Ambulation Distance (Feet) 150 Feet   Assistive device None   Gait Pattern Trendelenburg  decreased push off on RLE in stance; slight Trendelenburg ga   Ambulation Surface Level;Indoor   Gait velocity 3.86  8.5   Standardized Balance Assessment   Standardized Balance Assessment Berg Balance Test   Berg Balance Test   Sit to Stand Able to stand without using hands and stabilize independently   Standing Unsupported  Able to stand safely 2 minutes   Sitting with Back Unsupported but Feet Supported on Floor or Stool Able to sit safely and securely 2 minutes   Stand to Sit Sits safely with minimal use of hands   Transfers Able to transfer safely, minor use of hands   Standing Unsupported with Eyes Closed Able to stand 10 seconds safely   Standing Ubsupported with Feet Together Able to place feet together independently and stand 1 minute safely   From Standing, Reach Forward with Outstretched Arm Can reach confidently >25 cm (10")   From Standing Position, Pick up Object from Floor Able to pick up shoe safely and easily   From Standing Position, Turn to Look Behind Over each Shoulder Looks behind from both sides and weight  shifts well   Turn 360 Degrees Able to turn 360 degrees safely in 4 seconds or less   Standing Unsupported, Alternately Place Feet on Step/Stool Able to stand independently and safely and complete 8 steps in 20 seconds  10.34   Standing Unsupported, One Foot in Front Able to place foot tandem independently and hold 30 seconds   Standing on One Leg Able to lift leg independently and hold > 10 seconds  R=3.56  L=14.09   Total Score 56   Dynamic Gait Index   Level Surface Mild Impairment   Change in Gait Speed Normal   Gait with Horizontal Head Turns Mild Impairment   Gait with Vertical Head Turns Normal   Gait and Pivot Turn Normal   Step Over Obstacle Normal   Step Around Obstacles Normal   Steps Mild Impairment   Total Score 21   Timed Up and Go Test   Normal TUG (seconds) 9.34      Self care; instructed in HEP for R plantarflexion in standing, seated position with grey theraband and in R SLS for balance retraining                     PT Education - 08/17/15 1328    Education provided Yes   Education Details see pt instructions   Person(s) Educated Patient   Methods Explanation;Demonstration;Handout   Comprehension Verbalized understanding;Returned demonstration             PT Long Term Goals - 08/17/15 1548    PT LONG TERM GOAL #1   Title Pt and wife will subjectively report improvement in gait pattern in early am by less occurrence of L foot "stomping" upon getting out of bed.  (09-17-15)   Time 4   Period Weeks   Status New   PT LONG TERM GOAL #2   Title Pt will report amb. at least 15" at home at least 5 times/week with S for safety.  (09-17-15)   Time 4   Period Weeks   Status New   PT LONG TERM GOAL #3   Title Independent in HEP for balance and R plantarflexor strengthening.  (09-17-15)   Time 4   Period Weeks   Status New   PT LONG TERM GOAL #4   Title Improve R SLS to at least 5 secs to demo increased balance.   Baseline 3.56 secs   Time  4   Period Weeks   Status New               Plan - 08/17/15 1336    Clinical Impression Statement Pt is an 82 year old gentleman with h/o 3 yr cognitive decline with memory deficits and  gait abnormality which pt attributes to a problem with his L leg; pt reports he stomps the L foot in the mornings but reports his walkin gets better as the day progresses.  Wife reports pt performed better with gait pattern and speed during the PT initial evaluation than he does at home.  A slight LLD was noted with LLE slightly shorter than RLE, and also weak R gastrocnemius noted with pt unable to plantarflex on R unilateral stance.  Pt unable to perform toe walking (on tip toes) due to weak R plantarflexors.  Decreased high level balance skills noted during eval - primarily decr. R SLS.                                                        Rehab Potential Good   Clinical Impairments Affecting Rehab Potential cognitive impairment - onset started approx. 3 yrs ago   PT Frequency 1x / week   PT Duration 4 weeks   PT Treatment/Interventions ADLs/Self Care Home Management;Gait training;Stair training;Functional mobility training;Therapeutic activities;Therapeutic exercise;Balance training;Neuromuscular re-education;Patient/family education   PT Next Visit Plan check HEP, use of heel wedge in L shoe;  R gastroc strengthening and SLS activities; 6" walk test   PT Home Exercise Plan R PF strengthening   Consulted and Agree with Plan of Care Patient;Family member/caregiver   Family Member Consulted wife [name]      Patient will benefit from skilled therapeutic intervention in order to improve the following deficits and impairments:  Abnormal gait, Decreased activity tolerance, Decreased balance, Decreased strength, Decreased cognition  Visit Diagnosis: Other abnormalities of gait and mobility - Plan: PT plan of care cert/re-cert  Muscle weakness (generalized) - Plan: PT plan of care cert/re-cert       G-Codes - 08-25-2015 1550    Functional Assessment Tool Used TUG score 8.97; gait velocity 3.86 ft/sec; Pt amb. with gait deviations including decreased push off RLE due to weak plantarflexors   Functional Limitation Mobility: Walking and moving around   Mobility: Walking and Moving Around Current Status [phone]) At least 20 percent but less than 40 percent impaired, limited or restricted   Mobility: Walking and Moving Around Goal Status [phone]) At least 1 percent but less than 20 percent impaired, limited or restricted       Problem List Patient Active Problem List   Diagnosis Date Noted  . MCI (mild cognitive impairment) 02/01/2015  . HTN (hypertension) 02/01/2015  . HLD (hyperlipidemia) 02/01/2015  . Depressed 02/01/2015  . Hypothyroidism 02/01/2015    Alda Lea, PT 2015/08/25, 4:02 PM  [facility] [address] [place], [place], [address] Phone: [phone]   Fax:  [phone]  Name: Latrice Kaiser MRN: [id number] Date of Birth: 06-27-1928

## 2015-08-27 ENCOUNTER — Ambulatory Visit: Payer: Medicare Other | Attending: Neurology | Admitting: Physical Therapy

## 2015-08-27 ENCOUNTER — Encounter: Payer: Self-pay | Admitting: Physical Therapy

## 2015-08-27 DIAGNOSIS — M6281 Muscle weakness (generalized): Secondary | ICD-10-CM

## 2015-08-27 DIAGNOSIS — R2689 Other abnormalities of gait and mobility: Secondary | ICD-10-CM | POA: Diagnosis not present

## 2015-08-27 NOTE — Therapy (Signed)
[facility] [address] [place], [place], [address] Phone: [phone]   Fax:  [phone]  Physical Therapy Treatment  Patient Details  Name: Spencer Howard MRN: [id number] Date of Birth: 09-Nov-1928 Referring Provider: Dr. Sarina Ill  Encounter Date: 08/27/2015      PT End of Session - 08/27/15 1704    Visit Number 2   Number of Visits 5   Date for PT Re-Evaluation 09/17/15   Authorization Type Medicare   Authorization Time Period 08-17-15 - 10-16-15   PT Start Time 0800   PT Stop Time 0856   PT Time Calculation (min) 56 min      Past Medical History  Diagnosis Date  . Hypertension   . High cholesterol   . Depression     Past Surgical History  Procedure Laterality Date  . Varicocelectomy  1967  . Tonsillectomy  1942  . Elbow surgery Left 1995    Broken    There were no vitals filed for this visit.      Subjective Assessment - 08/27/15 0927    Subjective Pt reports he is doing the exercises some, but wife reports he is not doing them as much as he should be doing; wife reports he did go walking for approximately 25" and he played golf one day last week   Patient is accompained by: Family member   Pertinent History memory loss and depression started after death of his daughter 4 yrs ago; pt and wife moved from [place], [place] in Oct. 2016   Patient Stated Goals Improve walking and balance   Currently in Pain? No/denies         6 laps - -20' = 1180' in 6" walk test                 [facility] Adult PT Treatment/Exercise - 08/27/15 1654    Ambulation/Gait   Ambulation/Gait Yes   Ambulation/Gait Assistance 5: Supervision   Ambulation/Gait Assistance Details cues for incr. heel strike on RLE and for controlled descent of LLE in stance to avoid foot slap   Ambulation Distance (Feet) 1180 Feet   Assistive device None   Gait Pattern Step-through pattern;Left foot flat   Ambulation Surface  Level;Indoor   Exercises   Exercises Lumbar   Lumbar Exercises: Machines for Strengthening   Leg Press bil. LE's 90# 3 sets 10 reps bil. LE's:  45# LLE 10 reps    Other Lumbar Machine Exercise Heel raises bil. LE's on leg press 30# 20 reps   Lumbar Exercises: Standing   Heel Raises 10 reps     TherEx:  Seated L dorsiflexion with eccentric control - instructed pt to slowly lower - 3-4 secs- recommended to incr. L  Eccentric dorsiflexion strength        Balance Exercises - 08/27/15 1657    Balance Exercises: Standing   Rockerboard Anterior/posterior;EO;Intermittent UE support;Other time (comment)  1"   Heel Raises Limitations weakness in R gastroc   Other Standing Exercises Heel walking, toe walking along countertop with UE support      Marching in place - slowly - 10-15 reps inside bars; marching forward 10' x 4 reps inside bars with SBA      PT Education - 08/27/15 1703    Education provided Yes   Education Details added R eccentric dorsiflexion with grey theraband in seated position; added heel and toe walking for balance/strengthening   Person(s) Educated Patient;Spouse   Methods Explanation;Demonstration;Handout   Comprehension Verbalized  understanding;Returned demonstration             PT Long Term Goals - 08/17/15 1548    PT LONG TERM GOAL #1   Title Pt and wife will subjectively report improvement in gait pattern in early am by less occurrence of L foot "stomping" upon getting out of bed.  (09-17-15)   Time 4   Period Weeks   Status New   PT LONG TERM GOAL #2   Title Pt will report amb. at least 15" at home at least 5 times/week with S for safety.  (09-17-15)   Time 4   Period Weeks   Status New   PT LONG TERM GOAL #3   Title Independent in HEP for balance and R plantarflexor strengthening.  (09-17-15)   Time 4   Period Weeks   Status New   PT LONG TERM GOAL #4   Title Improve R SLS to at least 5 secs to demo increased balance.   Baseline 3.56 secs    Time 4   Period Weeks   Status New               Plan - 08/27/15 1705    Clinical Impression Statement Pt has decreased strength L dorsiflexors eccentrically as evidenced by L foot slap in  stance phase of gait, which increases with fatigue:  pt had one occurrence of significant LOB/fall in which he arose from mat and quickly began amb. and almost fell but recovered LOB with CGA - pt appears to move impulsively at times                                                                                                                                                                         Rehab Potential Good   Clinical Impairments Affecting Rehab Potential cognitive impairment - onset started approx. 3 yrs ago   PT Frequency 1x / week   PT Duration 4 weeks   PT Treatment/Interventions ADLs/Self Care Home Management;Gait training;Stair training;Functional mobility training;Therapeutic activities;Therapeutic exercise;Balance training;Neuromuscular re-education;Patient/family education   PT Next Visit Plan check HEP, use of heel wedge in L shoe; continue strengthening bil. LE's   PT Home Exercise Plan R PF strengthening - added L dorsiflexion with grey band on 08-27-15   Consulted and Agree with Plan of Care Patient;Family member/caregiver   Family Member Consulted wife [name]      Patient will benefit from skilled therapeutic intervention in order to improve the following deficits and impairments:  Abnormal gait, Decreased activity tolerance, Decreased balance, Decreased strength, Decreased cognition  Visit Diagnosis: Other abnormalities of gait and mobility  Muscle weakness (generalized)     Problem List Patient Active Problem List   Diagnosis Date Noted  .  MCI (mild cognitive impairment) 02/01/2015  . HTN (hypertension) 02/01/2015  . HLD (hyperlipidemia) 02/01/2015  . Depressed 02/01/2015  . Hypothyroidism 02/01/2015    Alda Lea, PT 08/27/2015, 5:30 PM  [facility] [address] [place], [place], [address] Phone: [phone]   Fax:  [phone]  Name: Spencer Howard MRN: [id number] Date of Birth: 04/26/1928

## 2015-08-27 NOTE — Patient Instructions (Addendum)
Dorsiflexion: Resisted    Facing anchor, tubing around left foot, pull toward face.  Repeat __10-15__ times per set. Do __1-2__ sets per session. Do __2_ sessions per day.  http://orth.exer.us/8   Copyright  VHI. All rights reserved.  Walking on Toes    Walk on toes for _6___ feet while continuing on a straight path. Do _3___ sessions per day.  3-4 times along countertop Copyright  VHI. All rights reserved.  Walking on Heels    Walk on heels for _6_ feet while continuing on a straight path. Do _2-3__ sessions per day. Walk 3-4 times along countertop Copyright  VHI. All rights reserved.

## 2015-09-03 ENCOUNTER — Ambulatory Visit: Payer: Medicare Other | Admitting: Physical Therapy

## 2015-09-03 DIAGNOSIS — R2689 Other abnormalities of gait and mobility: Secondary | ICD-10-CM | POA: Diagnosis not present

## 2015-09-03 DIAGNOSIS — M6281 Muscle weakness (generalized): Secondary | ICD-10-CM | POA: Diagnosis not present

## 2015-09-04 ENCOUNTER — Encounter: Payer: Self-pay | Admitting: Physical Therapy

## 2015-09-04 NOTE — Patient Instructions (Signed)
SINGLE LIMB STANCE    Stance: single leg on floor. Raise leg. Hold _10__ seconds. Repeat with other leg. _2__ reps per set, _2__ sets per day, __5_ days per week  Copyright  VHI. All rights reserved.  Hip Abduction: Standing - Straight Leg    In shoulder width stance, tubing around ankles, pull leg out to side, keeping knee straight. Repeat _10_ times per set. Repeat with other leg. Do 2__ sets per session. Do 5__ sessions per week. NO BAND USED - SIDE KICKS FOR IMPROVED STANDING BALANCE http://tub.exer.us/207   Copyright  VHI. All rights reserved.  "I love a Database administrator    Using a chair if necessary, march in place 4 times in each phase: (1) Foot raised 6" (2) 12" (3) 18" (4) as high as you can. Repeat __10__ times. Do __2__ sessions per day.  http://gt2.exer.us/344   Copyright  VHI. All rights reserved.   NEW HEP ------ as of 09-03-15  Balance HEP GIVEN -  Forward, back and side kicks;  Marching in place, forwards and backwards:  SLS:  Sidestepping with crossovers  Modified Heel raise ex. For PF strengthening to seated position with use of blue theraband - due to pt stating he cannot perform this exercise at all in standing position

## 2015-09-04 NOTE — Therapy (Signed)
[facility] [address] [place], [place], [address] Phone: [phone]   Fax:  [phone]  Physical Therapy Treatment  Patient Details  Name: Spencer Howard MRN: [id number] Date of Birth: [birth year] Referring Provider: Dr. Sarina Ill  Encounter Date: 09/03/2015      PT End of Session - 09/04/15 1414    Visit Number 3   Number of Visits 5   Date for PT Re-Evaluation 09/17/15   Authorization Type Medicare   Authorization Time Period 08-17-15 - 10-16-15   PT Start Time 0855   PT Stop Time 0935   PT Time Calculation (min) 40 min      Past Medical History  Diagnosis Date  . Hypertension   . High cholesterol   . Depression     Past Surgical History  Procedure Laterality Date  . Varicocelectomy  1967  . Tonsillectomy  1942  . Elbow surgery Left 1995    Broken    There were no vitals filed for this visit.      Subjective Assessment - 09/04/15 1402    Subjective WIfe reports pt is not really doing the exercises at home - states he does "alot of sitting" (depression?)   Patient is accompained by: Family member   Pertinent History memory loss and depression started after death of his daughter 4 yrs ago; pt and wife moved from [place], [place] in Oct. 2016   Patient Stated Goals Improve walking and balance   Currently in Pain? No/denies                         [facility] Adult PT Treatment/Exercise - 09/04/15 0001    Ambulation/Gait   Ambulation/Gait Yes   Ambulation/Gait Assistance 5: Supervision   Ambulation/Gait Assistance Details cues for incr. heel strike LLE   Ambulation Distance (Feet) 250 Feet   Assistive device None   Gait Pattern Step-through pattern;Left foot flat   Ambulation Surface Level;Indoor   Lumbar Exercises: Aerobic   Stationary Bike SciFit level 2.0 x 5" with UE's and LE's   Lumbar Exercises: Machines for Strengthening   Leg Press bil. LE's 60# 3 sets 10 reps      Revised PF strengthening exercise to seated position rather than standing due to difficulty        Balance Exercises - 09/04/15 1406    Balance Exercises: Standing   SLS Eyes open;Solid surface;2 reps;10 secs   SLS with Vectors Solid surface;5 reps   Rockerboard Anterior/posterior;Intermittent UE support;EO;30 seconds     Pt performed forward, backward and side kicks 10 reps each leg with UE support prn;  Marching in place 10 reps each; Marching forwards and backwards inside bars with UE support prn  Stepping over and back of balance beam 5 times each leg with UE support prn       PT Education - 09/04/15 1413    Education provided Yes   Education Details PF in seated only due to difficulty in standing :  changed balance HEP to forward, back and side kicks; SLS:  marching and sidestepping   Person(s) Educated Patient;Spouse   Methods Explanation;Demonstration;Handout   Comprehension Verbalized understanding;Returned demonstration             PT Long Term Goals - 08/17/15 1548    PT LONG TERM GOAL #1   Title Pt and wife will subjectively report improvement in gait pattern in early am by less occurrence of L foot "stomping" upon getting  out of bed.  (09-17-15)   Time 4   Period Weeks   Status New   PT LONG TERM GOAL #2   Title Pt will report amb. at least 15" at home at least 5 times/week with S for safety.  (09-17-15)   Time 4   Period Weeks   Status New   PT LONG TERM GOAL #3   Title Independent in HEP for balance and R plantarflexor strengthening.  (09-17-15)   Time 4   Period Weeks   Status New   PT LONG TERM GOAL #4   Title Improve R SLS to at least 5 secs to demo increased balance.   Baseline 3.56 secs   Time 4   Period Weeks   Status New               Plan - 09/04/15 1416    Clinical Impression Statement Pt continues to demo L foot "stomping" at times, especially noted with gait initiation which improves as pt ambulates more;  pt has decreased  strength in R plantarflexors which contributes to gait deviations;   Rehab Potential Good   Clinical Impairments Affecting Rehab Potential cognitive impairment - onset started approx. 3 yrs ago   PT Frequency 1x / week   PT Duration 4 weeks   PT Treatment/Interventions ADLs/Self Care Home Management;Gait training;Stair training;Functional mobility training;Therapeutic activities;Therapeutic exercise;Balance training;Neuromuscular re-education;Patient/family education   PT Next Visit Plan check HEP, use of heel wedge in L shoe; continue strengthening bil. LE's   PT Home Exercise Plan R PF strengthening - added L dorsiflexion with grey band on 08-27-15; Balance HEP - kicks, marching and sidestepping, SLS:  PF in seated only as of 09-03-15   Consulted and Agree with Plan of Care Patient;Family member/caregiver   Family Member Consulted wife Spencer Howard      Patient will benefit from skilled therapeutic intervention in order to improve the following deficits and impairments:  Abnormal gait, Decreased activity tolerance, Decreased balance, Decreased strength, Decreased cognition  Visit Diagnosis: Other abnormalities of gait and mobility  Muscle weakness (generalized)     Problem List Patient Active Problem List   Diagnosis Date Noted  . MCI (mild cognitive impairment) 02/01/2015  . HTN (hypertension) 02/01/2015  . HLD (hyperlipidemia) 02/01/2015  . Depressed 02/01/2015  . Hypothyroidism 02/01/2015    [place], PT 09/04/2015, 2:21 PM  [facility] [address] [place], [place], [address] Phone: [phone]   Fax:  [phone]  Name: Spencer Howard MRN: [id number] Date of Birth: 11/10/1928

## 2015-09-17 ENCOUNTER — Ambulatory Visit: Payer: Medicare Other | Admitting: Physical Therapy

## 2015-09-17 DIAGNOSIS — M6281 Muscle weakness (generalized): Secondary | ICD-10-CM

## 2015-09-17 DIAGNOSIS — R2689 Other abnormalities of gait and mobility: Secondary | ICD-10-CM | POA: Diagnosis not present

## 2015-09-18 NOTE — Therapy (Signed)
[facility] [address] [place], [place], [address] Phone: [phone]   Fax:  [phone]  Physical Therapy Treatment  Patient Details  Name: Spencer Howard MRN: [id number] Date of Birth: [birth year] Referring Provider: Dr. Sarina Ill  Encounter Date: 09/17/2015      PT End of Session - 09/18/15 0939    Visit Number 4   Number of Visits 5   Date for PT Re-Evaluation 09/17/15   Authorization Type Medicare   Authorization Time Period 08-17-15 - 10-16-15   PT Start Time 0847   PT Stop Time 0932   PT Time Calculation (min) 45 min      Past Medical History  Diagnosis Date  . Hypertension   . High cholesterol   . Depression     Past Surgical History  Procedure Laterality Date  . Varicocelectomy  1967  . Tonsillectomy  1942  . Elbow surgery Left 1995    Broken    There were no vitals filed for this visit.      Subjective Assessment - 09/18/15 0932    Subjective Pt reports he played golf once and mowed the yard one time since last visit; wife states pt has only done HEP 1-2 times since visit 2 weeks ago   Patient is accompained by: Family member  wife Spencer Howard   Patient Stated Goals Improve walking and balance   Currently in Pain? No/denies                         [facility] Adult PT Treatment/Exercise - 09/18/15 0001    Ambulation/Gait   Ambulation/Gait Yes   Ambulation/Gait Assistance 5: Supervision   Ambulation/Gait Assistance Details cues for upright posture   Ambulation Distance (Feet) 250 Feet   Assistive device None   Gait Pattern Step-through pattern;Left foot flat   Ambulation Surface Level;Indoor   Lumbar Exercises: Stretches   Active Hamstring Stretch 1 rep;30 seconds   Lumbar Exercises: Aerobic   Stationary Bike SciFit level 2.0 x 5" with UE's and LE's   Lumbar Exercises: Machines for Strengthening   Leg Press bil. LE's 70# 3 sets 10 reps   Lumbar Exercises: Standing   Heel  Raises 10 reps             Balance Exercises - 09/18/15 0936    Balance Exercises: Standing   Standing Eyes Closed Wide (BOA);Other (comment)  on rocker board   SLS with Vectors Solid surface;5 reps   Rockerboard Anterior/posterior;Intermittent UE support;EO;30 seconds   Heel Raises Limitations weakness in R gastroc           PT Education - 09/18/15 [phone]    Education provided Yes   Education Details Discussed participation in exercise class at [facility] to increase compliance due to pt not doing HEP at home   Person(s) Educated Patient;Spouse   Methods Explanation   Comprehension Verbalized understanding             PT Long Term Goals - 08/17/15 1548    PT LONG TERM GOAL #1   Title Pt and wife will subjectively report improvement in gait pattern in early am by less occurrence of L foot "stomping" upon getting out of bed.  (09-17-15)   Time 4   Period Weeks   Status New   PT LONG TERM GOAL #2   Title Pt will report amb. at least 15" at home at least 5 times/week with S for safety.  (  09-17-15)   Time 4   Period Weeks   Status New   PT LONG TERM GOAL #3   Title Independent in HEP for balance and R plantarflexor strengthening.  (09-17-15)   Time 4   Period Weeks   Status New   PT LONG TERM GOAL #4   Title Improve R SLS to at least 5 secs to demo increased balance.   Baseline 3.56 secs   Time 4   Period Weeks   Status New               Plan - 09/18/15 0940    Clinical Impression Statement Pt needs cues to slow down with transitional movements, i.e. transfers; pt able to improve gait pattern with concentration and focus on gait; decreased compliance wtih HEP per wife's report   Rehab Potential Good   Clinical Impairments Affecting Rehab Potential cognitive impairment - onset started approx. 3 yrs ago   PT Frequency 1x / week   PT Duration 4 weeks   PT Treatment/Interventions ADLs/Self Care Home Management;Gait training;Stair training;Functional  mobility training;Therapeutic activities;Therapeutic exercise;Balance training;Neuromuscular re-education;Patient/family education   PT Next Visit Plan check HEP, use of heel wedge in L shoe; continue strengthening bil. LE's   PT Home Exercise Plan R PF strengthening - added L dorsiflexion with grey band on 08-27-15; Balance HEP - kicks, marching and sidestepping, SLS:  PF in seated only as of 09-03-15   Consulted and Agree with Plan of Care Patient;Family member/caregiver   Family Member Consulted wife Spencer Howard      Patient will benefit from skilled therapeutic intervention in order to improve the following deficits and impairments:  Abnormal gait, Decreased activity tolerance, Decreased balance, Decreased strength, Decreased cognition  Visit Diagnosis: Other abnormalities of gait and mobility  Muscle weakness (generalized)     Problem List Patient Active Problem List   Diagnosis Date Noted  . MCI (mild cognitive impairment) 02/01/2015  . HTN (hypertension) 02/01/2015  . HLD (hyperlipidemia) 02/01/2015  . Depressed 02/01/2015  . Hypothyroidism 02/01/2015    Alda Lea, PT 09/18/2015, 9:45 AM  [facility] [address] [place], [place], [address] Phone: [phone]   Fax:  [phone]  Name: Spencer Howard MRN: [id number] Date of Birth: 09-10-1928

## 2015-09-24 ENCOUNTER — Ambulatory Visit: Payer: Medicare Other | Admitting: Physical Therapy

## 2015-09-28 ENCOUNTER — Ambulatory Visit: Payer: Medicare Other | Attending: Neurology | Admitting: Physical Therapy

## 2015-09-28 DIAGNOSIS — M6281 Muscle weakness (generalized): Secondary | ICD-10-CM

## 2015-09-28 DIAGNOSIS — R2689 Other abnormalities of gait and mobility: Secondary | ICD-10-CM | POA: Insufficient documentation

## 2015-09-29 NOTE — Therapy (Signed)
[facility] [address] [place], [place], [address] Phone: [phone]   Fax:  [phone]  Physical Therapy Treatment  Patient Details  Name: Spencer Howard MRN: [id number] Date of Birth: 03/25/1929 Referring Provider: Dr. Sarina Ill  Encounter Date: 09/28/2015      PT End of Session - 09/29/15 1706    Visit Number 5   Number of Visits 6   Date for PT Re-Evaluation 09/17/15   Authorization Type Medicare   Authorization Time Period 08-17-15 - 10-16-15   PT Start Time 0933   PT Stop Time 1017   PT Time Calculation (min) 44 min      Past Medical History  Diagnosis Date  . Hypertension   . High cholesterol   . Depression     Past Surgical History  Procedure Laterality Date  . Varicocelectomy  1967  . Tonsillectomy  1942  . Elbow surgery Left 1995    Broken    There were no vitals filed for this visit.      Subjective Assessment - 09/29/15 1700    Subjective Wife reports they have been to [place] 3 times - pt has done the [facility] class while she is in weight room   Patient is accompained by: Family member   Pertinent History memory loss and depression started after death of his daughter 4 yrs ago; pt and wife moved from [place], [place] in Oct. 2016   Patient Stated Goals e   Currently in Pain? No/denies                         [facility] Adult PT Treatment/Exercise - 09/29/15 0001    Ambulation/Gait   Ambulation/Gait Yes   Ambulation/Gait Assistance 5: Supervision   Ambulation/Gait Assistance Details cues for upright posture   Ambulation Distance (Feet) 240 Feet   Assistive device None   Gait Pattern Step-through pattern;Left foot flat   Ambulation Surface Level;Indoor   Exercises   Exercises Knee/Hip   Lumbar Exercises: Stretches   Active Hamstring Stretch 1 rep;30 seconds  bil. LE's - runner's stretch   Lumbar Exercises: Aerobic   Stationary Bike SciFit level 2.0 x 5" with  UE's and LE's   Lumbar Exercises: Machines for Strengthening   Leg Press bil. LE's 50# 3 sets 10 reps bil. LE's   Lumbar Exercises: Standing   Heel Raises 10 reps   Lumbar Exercises: Seated   Sit to Stand 10 reps  5 reps on floor and 5 reps on AirEx cushion with SBA   Knee/Hip Exercises: Stretches   Gastroc Stretch Both;1 rep;30 seconds  with 2" step   Knee/Hip Exercises: Standing   Forward Step Up Both;1 set;10 reps;Step Height: 6"   Rocker Board 1 minute   Other Standing Knee Exercises Bil. hip flexion, extension and abduction with green theraband x 10 reps each at counter       NeuroRe-ed;  Sidestepping, crossovers front, stepping behind, and then combined for braiding exercise 10' x 4 reps inside bars with SBA And cues for sequence   Stepping over and back of balance beam without UE support 5 reps each leg with SBA             PT Long Term Goals - 08/17/15 1548    PT LONG TERM GOAL #1   Title Pt and wife will subjectively report improvement in gait pattern in early am by less occurrence of L foot "stomping" upon getting  out of bed.  (09-17-15)   Time 4   Period Weeks   Status New   PT LONG TERM GOAL #2   Title Pt will report amb. at least 15" at home at least 5 times/week with S for safety.  (09-17-15)   Time 4   Period Weeks   Status New   PT LONG TERM GOAL #3   Title Independent in HEP for balance and R plantarflexor strengthening.  (09-17-15)   Time 4   Period Weeks   Status New   PT LONG TERM GOAL #4   Title Improve R SLS to at least 5 secs to demo increased balance.   Baseline 3.56 secs   Time 4   Period Weeks   Status New               Plan - 09/29/15 1706    Clinical Impression Statement Pt continues to exhibit gait deviations including decreased push off of RLE in stance due to weak gastroc and pt continue to "stomp" L foot at times, especially when gait is initiiated; wife reports pt does better in clinic than he does at home   Rehab  Potential Good   Clinical Impairments Affecting Rehab Potential cognitive impairment - onset started approx. 3 yrs ago   PT Frequency 1x / week   PT Duration 4 weeks   PT Treatment/Interventions ADLs/Self Care Home Management;Gait training;Stair training;Functional mobility training;Therapeutic activities;Therapeutic exercise;Balance training;Neuromuscular re-education;Patient/family education   PT Home Exercise Plan R PF strengthening - added L dorsiflexion with grey band on 08-27-15; Balance HEP - kicks, marching and sidestepping, SLS:  PF in seated only as of 09-03-15   Recommended Other Services pt has started doing [facility] ex. class at [place] with Plan of Care Patient;Family member/caregiver   Family Member Consulted wife [name]      Patient will benefit from skilled therapeutic intervention in order to improve the following deficits and impairments:  Abnormal gait, Decreased activity tolerance, Decreased balance, Decreased strength, Decreased cognition  Visit Diagnosis: Other abnormalities of gait and mobility  Muscle weakness (generalized)     Problem List Patient Active Problem List   Diagnosis Date Noted  . MCI (mild cognitive impairment) 02/01/2015  . HTN (hypertension) 02/01/2015  . HLD (hyperlipidemia) 02/01/2015  . Depressed 02/01/2015  . Hypothyroidism 02/01/2015    Alda Lea, PT 09/29/2015, 5:10 PM  [facility] [address] [place], [place], [address] Phone: [phone]   Fax:  [phone]  Name: Spencer Howard MRN: [id number] Date of Birth: Aug 06, 1928

## 2015-10-13 ENCOUNTER — Ambulatory Visit: Payer: Medicare Other | Admitting: Physical Therapy

## 2015-10-13 DIAGNOSIS — R2689 Other abnormalities of gait and mobility: Secondary | ICD-10-CM

## 2015-10-13 DIAGNOSIS — M6281 Muscle weakness (generalized): Secondary | ICD-10-CM | POA: Diagnosis not present

## 2015-10-13 NOTE — Patient Instructions (Signed)
SINGLE LIMB STANCE    Stance: single leg on floor. Raise leg. Hold _10__ seconds. Repeat with other leg. _2__ reps per set, __1_ sets per day, _5__ days per week  Copyright  VHI. All rights reserved.  ANKLE: Plantarflexion, Unilateral - Standing    Stand on leg. Raise heel up as high as possible. _10-15__ reps per set, __2_ sets per day, _5__ days per week Hold onto a support.  Copyright  VHI. All rights reserved.  ANKLE: Plantarflexion, Bilateral - Standing    Stand with upright posture. Raise heels up as high as possible. _10-15__ reps per set, _2_ sets per day, __5_ days per week Hold onto a support.  Copyright  VHI. All rights reserved.

## 2015-10-18 NOTE — Therapy (Signed)
[facility] [address] [place], [place], [address] Phone: [phone]   Fax:  [phone]  Physical Therapy Treatment  Patient Details  Name: Spencer Howard Needs MRN: [id number] Date of Birth: 06-Oct-1928 Referring Provider: Dr. Sarina Ill  Encounter Date: 10/13/2015      PT End of Session - 10/18/15 1103    Visit Number 6   Number of Visits 6   Date for PT Re-Evaluation 10/16/15   Authorization Type Medicare   Authorization Time Period 08-17-15 - 10-16-15   PT Start Time 0850   PT Stop Time 0932   PT Time Calculation (min) 42 min      Past Medical History:  Diagnosis Date  . Depression   . High cholesterol   . Hypertension     Past Surgical History:  Procedure Laterality Date  . ELBOW SURGERY Left 1995   Broken  . TONSILLECTOMY  1942  . VARICOCELECTOMY  1967    There were no vitals filed for this visit.      Subjective Assessment - 10/18/15 1100    Subjective Wife reports that she and pt have been going to [facility] but he really does not like to do the [facility] class   Patient is accompained by: Family member   Pertinent History memory loss and depression started after death of his daughter 4 yrs ago; pt and wife moved from [place], [place] in Oct. 2016   Patient Stated Goals improve balance   Currently in Pain? No/denies                         [facility] Adult PT Treatment/Exercise - 10/18/15 0001      Lumbar Exercises: Aerobic   Stationary Bike SciFit level 2.0 x 5" with UE's and LE's     Lumbar Exercises: Machines for Strengthening   Leg Press bil. LE's 50# 3 sets 10 reps bil. LE's   Other Lumbar Machine Exercise Heel raises bil. LE's on leg press 30# 20 reps     Lumbar Exercises: Standing   Heel Raises 10 reps     Lumbar Exercises: Seated   Sit to Stand 10 reps  5 reps on floor and 5 reps on AirEx cushion with SBA     Knee/Hip Exercises: Stretches   Active Hamstring  Stretch Both;1 rep;30 seconds   Gastroc Stretch Both;1 rep;30 seconds  with 2" step     Knee/Hip Exercises: Standing   Rocker Board 1 minute   Other Standing Knee Exercises Bil. hip flexion, extension and abduction with green theraband x 10 reps each at counter                     PT Long Term Goals - 10/18/15 1104      PT LONG TERM GOAL #1   Title Pt and wife will subjectively report improvement in gait pattern in early am by less occurrence of L foot "stomping" upon getting out of bed.  (09-17-15)   Baseline wife reports gait pattern varies -   10-13-15   Status Not Met     PT LONG TERM GOAL #2   Title Pt will report amb. at least 15" at home at least 5 times/week with S for safety.  (09-17-15)   Baseline not frequency of 5x/week per wife's report   10-13-15   Status Partially Met     PT LONG TERM GOAL #3   Title Independent in HEP  for balance and R plantarflexor strengthening.  (09-17-15)   Baseline met 10-13-15   Status Achieved     PT LONG TERM GOAL #4   Title Improve R SLS to at least 5 secs to demo increased balance.   Baseline approx. 2-3 secs on RLE   Status Not Met               Plan - 10/18/15 1106    Clinical Impression Statement Pt met LTG#3 and partially met LTG #2:  goals #1,4 not met due to no significant changes in balance or gait pattern   Rehab Potential Good   Clinical Impairments Affecting Rehab Potential cognitive impairment - onset started approx. 3 yrs ago   PT Frequency 1x / week   PT Duration 4 weeks   PT Treatment/Interventions ADLs/Self Care Home Management;Gait training;Stair training;Functional mobility training;Therapeutic activities;Therapeutic exercise;Balance training;Neuromuscular re-education;Patient/family education   PT Next Visit Plan D/C   PT Home Exercise Plan R PF strengthening - added L dorsiflexion with grey band on 08-27-15; Balance HEP - kicks, marching and sidestepping, SLS:  PF in seated only as of 09-03-15    Recommended Other Services possibly try aquatic ex class   Consulted and Agree with Plan of Care Patient;Family member/caregiver   Family Member Consulted wife [name]      Patient will benefit from skilled therapeutic intervention in order to improve the following deficits and impairments:  Abnormal gait, Decreased activity tolerance, Decreased balance, Decreased strength, Decreased cognition  Visit Diagnosis: Other abnormalities of gait and mobility  Muscle weakness (generalized)     Problem List Patient Active Problem List   Diagnosis Date Noted  . MCI (mild cognitive impairment) 02/01/2015  . HTN (hypertension) 02/01/2015  . HLD (hyperlipidemia) 02/01/2015  . Depressed 02/01/2015  . Hypothyroidism 02/01/2015    Alda Lea, PT 10/18/2015, 11:17 AM  [facility] [address] [place], [place], [address] Phone: [phone]   Fax:  [phone]  Name: Spencer Howard MRN: [id number] Date of Birth: 04-03-1928

## 2015-10-18 NOTE — Therapy (Signed)
[facility] [address] [place], [place], [address] Phone: [phone]   Fax:  [phone]  Physical Therapy Treatment  Patient Details  Name: Spencer Howard MRN: [id number] Date of Birth: 06-Oct-1928 Referring Provider: Dr. Sarina Ill  Encounter Date: 10/13/2015      PT End of Session - 10/18/15 1103    Visit Number 6   Number of Visits 6   Date for PT Re-Evaluation 10/16/15   Authorization Type Medicare   Authorization Time Period 08-17-15 - 10-16-15   PT Start Time 0850   PT Stop Time 0932   PT Time Calculation (min) 42 min      Past Medical History:  Diagnosis Date  . Depression   . High cholesterol   . Hypertension     Past Surgical History:  Procedure Laterality Date  . ELBOW SURGERY Left 1995   Broken  . TONSILLECTOMY  1942  . VARICOCELECTOMY  1967    There were no vitals filed for this visit.      Subjective Assessment - 10/18/15 1100    Subjective Wife reports that she and pt have been going to [facility] but he really does not like to do the [facility] class   Patient is accompained by: Family member   Pertinent History memory loss and depression started after death of his daughter 4 yrs ago; pt and wife moved from [place], [place] in Oct. 2016   Patient Stated Goals improve balance   Currently in Pain? No/denies                         [facility] Adult PT Treatment/Exercise - 10/18/15 0001      Lumbar Exercises: Aerobic   Stationary Bike SciFit level 2.0 x 5" with UE's and LE's     Lumbar Exercises: Machines for Strengthening   Leg Press bil. LE's 50# 3 sets 10 reps bil. LE's   Other Lumbar Machine Exercise Heel raises bil. LE's on leg press 30# 20 reps     Lumbar Exercises: Standing   Heel Raises 10 reps     Lumbar Exercises: Seated   Sit to Stand 10 reps  5 reps on floor and 5 reps on AirEx cushion with SBA     Knee/Hip Exercises: Stretches   Active Hamstring  Stretch Both;1 rep;30 seconds   Gastroc Stretch Both;1 rep;30 seconds  with 2" step     Knee/Hip Exercises: Standing   Rocker Board 1 minute   Other Standing Knee Exercises Bil. hip flexion, extension and abduction with green theraband x 10 reps each at counter                     PT Long Term Goals - 10/18/15 1104      PT LONG TERM GOAL #1   Title Pt and wife will subjectively report improvement in gait pattern in early am by less occurrence of L foot "stomping" upon getting out of bed.  (09-17-15)   Baseline wife reports gait pattern varies -   10-13-15   Status Not Met     PT LONG TERM GOAL #2   Title Pt will report amb. at least 15" at home at least 5 times/week with S for safety.  (09-17-15)   Baseline not frequency of 5x/week per wife's report   10-13-15   Status Partially Met     PT LONG TERM GOAL #3   Title Independent in HEP  for balance and R plantarflexor strengthening.  (09-17-15)   Baseline met 10-13-15   Status Achieved     PT LONG TERM GOAL #4   Title Improve R SLS to at least 5 secs to demo increased balance.   Baseline approx. 2-3 secs on RLE   Status Not Met               Plan - 10/18/15 1106    Clinical Impression Statement Pt met LTG#3 and partially met LTG #2:  goals #1,4 not met due to no significant changes in balance or gait pattern   Rehab Potential Good   Clinical Impairments Affecting Rehab Potential cognitive impairment - onset started approx. 3 yrs ago   PT Frequency 1x / week   PT Duration 4 weeks   PT Treatment/Interventions ADLs/Self Care Home Management;Gait training;Stair training;Functional mobility training;Therapeutic activities;Therapeutic exercise;Balance training;Neuromuscular re-education;Patient/family education   PT Next Visit Plan D/C   PT Home Exercise Plan R PF strengthening - added L dorsiflexion with grey band on 08-27-15; Balance HEP - kicks, marching and sidestepping, SLS:  PF in seated only as of 09-03-15    Recommended Other Services possibly try aquatic ex class   Consulted and Agree with Plan of Care Patient;Family member/caregiver   Family Member Consulted wife Spencer Howard      Patient will benefit from skilled therapeutic intervention in order to improve the following deficits and impairments:  Abnormal gait, Decreased activity tolerance, Decreased balance, Decreased strength, Decreased cognition  Visit Diagnosis: Other abnormalities of gait and mobility  Muscle weakness (generalized)     Problem List Patient Active Problem List   Diagnosis Date Noted  . MCI (mild cognitive impairment) 02/01/2015  . HTN (hypertension) 02/01/2015  . HLD (hyperlipidemia) 02/01/2015  . Depressed 02/01/2015  . Hypothyroidism 02/01/2015    Alda Lea 10/18/2015, 11:19 AM  [facility] [address] [place], [place], [address] Phone: [phone]   Fax:  [phone]  Name: Spencer Howard MRN: [id number] Date of Birth: [birth year]

## 2015-10-18 NOTE — Therapy (Signed)
[facility] [address] [place], [place], [address] Phone: [phone]   Fax:  [phone]  Physical Therapy Treatment  Patient Details  Name: Spencer Howard MRN: [id number] Date of Birth: 1928-08-18 Referring Provider: Dr. Sarina Ill  Encounter Date: 10/13/2015      PT End of Session - 10/18/15 1103    Visit Number 6   Number of Visits 6   Date for PT Re-Evaluation 10/16/15   Authorization Type Medicare   Authorization Time Period 08-17-15 - 10-16-15   PT Start Time 0850   PT Stop Time 0932   PT Time Calculation (min) 42 min      Past Medical History:  Diagnosis Date  . Depression   . High cholesterol   . Hypertension     Past Surgical History:  Procedure Laterality Date  . ELBOW SURGERY Left 1995   Broken  . TONSILLECTOMY  1942  . VARICOCELECTOMY  1967    There were no vitals filed for this visit.      Subjective Assessment - 10/18/15 1100    Subjective Wife reports that she and pt have been going to [facility] but he really does not like to do the [facility] class   Patient is accompained by: Family member   Pertinent History memory loss and depression started after death of his daughter 4 yrs ago; pt and wife moved from [place], [place] in Oct. 2016   Patient Stated Goals improve balance   Currently in Pain? No/denies                         [facility] Adult PT Treatment/Exercise - 10/18/15 0001      Lumbar Exercises: Aerobic   Stationary Bike SciFit level 2.0 x 5" with UE's and LE's     Lumbar Exercises: Machines for Strengthening   Leg Press bil. LE's 50# 3 sets 10 reps bil. LE's   Other Lumbar Machine Exercise Heel raises bil. LE's on leg press 30# 20 reps     Lumbar Exercises: Standing   Heel Raises 10 reps     Lumbar Exercises: Seated   Sit to Stand 10 reps  5 reps on floor and 5 reps on AirEx cushion with SBA     Knee/Hip Exercises: Stretches   Active Hamstring  Stretch Both;1 rep;30 seconds   Gastroc Stretch Both;1 rep;30 seconds  with 2" step     Knee/Hip Exercises: Standing   Rocker Board 1 minute   Other Standing Knee Exercises Bil. hip flexion, extension and abduction with green theraband x 10 reps each at counter        [place]  Visits from Start of Care: 5  Current functional level related to goals / functional outcomes: See above for progress towards goals   Remaining deficits: Continued decr. Standing balance - especially decr. High level balance skills Continued gait deviations which vary in occurrence and in severity   Education / Equipment: Pt has been instructed in a HEP for balance and strengthening exercises; pt and wife have been given info on [facility] which they are currently participating in classes and use of equipment.   Plan: Patient agrees to discharge.  Patient goals were partially met. Patient is being discharged due to the physician's request.  ?????     Pt has maximized functional progress at this time.             PT Long  Term Goals - 10/18/15 1120      PT LONG TERM GOAL #1   Title Pt and wife will subjectively report improvement in gait pattern in early am by less occurrence of L foot "stomping" upon getting out of bed.  (09-17-15)   Baseline wife reports gait pattern varies -   10-13-15   Status Not Met     PT LONG TERM GOAL #2   Title Pt will report amb. at least 15" at home at least 5 times/week with S for safety.  (09-17-15)   Baseline not frequency of 5x/week per wife's report   10-13-15   Time 4   Period Weeks   Status Partially Met     PT LONG TERM GOAL #3   Title Independent in HEP for balance and R plantarflexor strengthening.  (09-17-15)   Baseline met 10-13-15   Status Achieved     PT LONG TERM GOAL #4   Title Improve R SLS to at least 5 secs to demo increased balance.   Baseline approx. 2-3 secs on RLE   Status Not Met                Plan - 10/18/15 1106    Clinical Impression Statement Pt met LTG#3 and partially met LTG #2:  goals #1,4 not met due to no significant changes in balance or gait pattern   Rehab Potential Good   Clinical Impairments Affecting Rehab Potential cognitive impairment - onset started approx. 3 yrs ago   PT Frequency 1x / week   PT Duration 4 weeks   PT Treatment/Interventions ADLs/Self Care Home Management;Gait training;Stair training;Functional mobility training;Therapeutic activities;Therapeutic exercise;Balance training;Neuromuscular re-education;Patient/family education   PT Next Visit Plan D/C   PT Home Exercise Plan R PF strengthening - added L dorsiflexion with grey band on 08-27-15; Balance HEP - kicks, marching and sidestepping, SLS:  PF in seated only as of 09-03-15   Recommended Other Services possibly try aquatic ex class   Consulted and Agree with Plan of Care Patient;Family member/caregiver   Family Member Consulted wife [name]      Patient will benefit from skilled therapeutic intervention in order to improve the following deficits and impairments:  Abnormal gait, Decreased activity tolerance, Decreased balance, Decreased strength, Decreased cognition  Visit Diagnosis: Other abnormalities of gait and mobility  Muscle weakness (generalized)     Problem List Patient Active Problem List   Diagnosis Date Noted  . MCI (mild cognitive impairment) 02/01/2015  . HTN (hypertension) 02/01/2015  . HLD (hyperlipidemia) 02/01/2015  . Depressed 02/01/2015  . Hypothyroidism 02/01/2015    Alda Lea, PT 10/18/2015, 11:21 AM  [facility] [address] [place], [place], [address] Phone: [phone]   Fax:  [phone]  Name: Spencer Howard MRN: [id number] Date of Birth: 1928/08/04

## 2015-11-09 DIAGNOSIS — F0390 Unspecified dementia without behavioral disturbance: Secondary | ICD-10-CM | POA: Diagnosis not present

## 2015-11-09 DIAGNOSIS — I10 Essential (primary) hypertension: Secondary | ICD-10-CM | POA: Diagnosis not present

## 2015-11-09 DIAGNOSIS — Z6826 Body mass index (BMI) 26.0-26.9, adult: Secondary | ICD-10-CM | POA: Diagnosis not present

## 2015-11-25 ENCOUNTER — Telehealth: Payer: Self-pay | Admitting: Neurology

## 2015-11-25 DIAGNOSIS — G3184 Mild cognitive impairment, so stated: Secondary | ICD-10-CM

## 2015-11-25 NOTE — Telephone Encounter (Signed)
Dr Ahern- please advise 

## 2015-11-25 NOTE — Telephone Encounter (Signed)
Patient's wife is calling stating the patient had Neuropsychological testing 12-31-14. The patient has an appointment with Dr. Jaynee Eagles on 02-02-16 and his wife is wondering if he should be referred to have the Neuropsychological testing before the appointment with Dr. Jaynee Eagles in November. Please call and discuss.

## 2015-11-26 NOTE — Telephone Encounter (Signed)
That's fine thanks, make sure they bring a copy of the their neuropsychiatric report with them to the appointment and they can see me about a month after the new study

## 2015-11-26 NOTE — Telephone Encounter (Signed)
Called and LVM for pt wife, Manuela Schwartz to call back. I was returning her call.  **Please let her know per Dr Jaynee Eagles: we placed referral for neuropsych testing. They should be getting a call to schedule soon.  *Make sure they bring a copy of the their neuropsychiatric report with them to f/u appointment with Dr Jaynee Eagles. They should make sure f/u is about a month after the new study. IF the November appt is too early, they can r/s to a later time.

## 2015-11-26 NOTE — Addendum Note (Signed)
Addended by: Hope Pigeon on: 11/26/2015 02:18 PM   Modules accepted: Orders

## 2015-11-26 NOTE — Telephone Encounter (Signed)
Pt's wife returned RN's call. Spencer Howard was not available, I read msg to her. She said she understood, she did not have any questions for RN or provider

## 2015-11-26 NOTE — Telephone Encounter (Signed)
Placed referral to neuropsych as requested per Dr Jaynee Eagles.

## 2015-12-26 DIAGNOSIS — Z23 Encounter for immunization: Secondary | ICD-10-CM | POA: Diagnosis not present

## 2016-02-02 ENCOUNTER — Ambulatory Visit: Payer: Medicare Other | Admitting: Neurology

## 2016-02-16 ENCOUNTER — Telehealth: Payer: Self-pay | Admitting: Cardiology

## 2016-02-16 NOTE — Telephone Encounter (Signed)
Receive records from [facility] for appointment on 03/03/16 with Dr Ellyn Hack.  Records given to [facility] (medical records) for Dr Allison Quarry schedule on 03/03/16

## 2016-02-26 HISTORY — PX: NM MYOVIEW LTD: HXRAD82

## 2016-02-26 HISTORY — PX: TRANSTHORACIC ECHOCARDIOGRAM: SHX275

## 2016-03-03 ENCOUNTER — Encounter: Payer: Self-pay | Admitting: Cardiology

## 2016-03-03 ENCOUNTER — Ambulatory Visit (INDEPENDENT_AMBULATORY_CARE_PROVIDER_SITE_OTHER): Payer: Medicare Other | Admitting: Cardiology

## 2016-03-03 VITALS — BP 105/63 | HR 79 | Ht 71.0 in | Wt 194.0 lb

## 2016-03-03 DIAGNOSIS — E784 Other hyperlipidemia: Secondary | ICD-10-CM

## 2016-03-03 DIAGNOSIS — R0789 Other chest pain: Secondary | ICD-10-CM | POA: Diagnosis not present

## 2016-03-03 DIAGNOSIS — R0609 Other forms of dyspnea: Secondary | ICD-10-CM

## 2016-03-03 DIAGNOSIS — I10 Essential (primary) hypertension: Secondary | ICD-10-CM

## 2016-03-03 DIAGNOSIS — E7849 Other hyperlipidemia: Secondary | ICD-10-CM

## 2016-03-03 DIAGNOSIS — R012 Other cardiac sounds: Secondary | ICD-10-CM

## 2016-03-03 NOTE — Progress Notes (Signed)
PCP: Haywood Pao, MD  Clinic Note: Chief Complaint  Patient presents with  . New Patient (Initial Visit)    pt states no chest pain -but wife indicates that he may be having some chest pain,   . Shortness of Breath    some   . Edema    none    HPI: Spencer Howard is a 82 y.o. male with a PMH below who presents today for Cardiology consultation for exertional dyspnea and hypotension. Generalized fatigue. His wife is concerned that there could be cardiac etiology.  Spencer Howard wasreferred by Dr. Osborne Casco after his clinic visit on November 20.  Family due to hypotension, amlodipine dose was reduced. Apparently also he was started on Depakote by his neurologist. Apparently he does not follow instructions with physical therapy and continues to drink wine. He noted profound dyspnea while in the shower and was doing routine activities.   Recent Hospitalizations: None  Studies Reviewed: None   Interval History: Spencer Howard presents here with his wife Who really provides most of the history. He himself tries to contradict her and minimizes what she describes. Basically patient is relatively inactive and doesn't do much around the house anymore. He has had several falls. He's had very poor balance, and he stopped playing golf. Basically his golfing partner felt like he was not safe to golf anymore. He will get up from his chair and has to hold onto things to maintain balance and melena against the bar or chair to keep his balance. He himself does not say he has shortness of breath when he walks around, but his wife notes he breathes heavily when he's walking. He has not actually passed out, but has had falls. I talked to him a little bit more in detail and he does indicate that every now and then he'll feel some discomfort in his chest with some dyspnea, and his wife indicates that this is getting worse over the last 6-8 months. Overall his energy level has reduced. He gets profoundly dyspneic getting  in the shower and getting dressed.  As a result of his balance issues, he really doesn't do any activities and therefore clearly has some deconditioning.  He has mild edema, but no PND or orthopnea. As he is a very poor historian, I didn't get a sense that he has any significant palpitations, but clearly has some lightheadedness and dizziness with near syncope and falls. But no TIA or amaurosis fugax symptoms. No true syncope.  No melena, hematochezia, hematuria, or epstaxis. No claudication.  ROS: A comprehensive was performed. Review of Systems  Constitutional: Positive for malaise/fatigue. Negative for chills and fever.  HENT: Positive for hearing loss. Negative for congestion and nosebleeds.   Eyes:       Overall worsening vision  Respiratory: Positive for shortness of breath and wheezing.   Cardiovascular:       Per history of present illness  Gastrointestinal: Negative.  Negative for blood in stool and melena.  Genitourinary: Positive for frequency. Negative for dysuria.  Musculoskeletal: Positive for back pain, falls (Several falls, worse now -- pre-much has some ataxia and has not been compliant with PT) and joint pain (Bilateral knees and hips).  Skin:       Small bruising/liver spots  Neurological: Positive for dizziness, weakness (Generalized weakness with poor balance) and headaches. Negative for loss of consciousness.  Endo/Heme/Allergies: Negative for environmental allergies.  Psychiatric/Behavioral: Positive for memory loss. Negative for depression. The patient is nervous/anxious. The patient does  not have insomnia.     Past Medical History:  Diagnosis Date  . Alcohol abuse with alcohol-induced mental disorder ([place])   . Dementia associated with alcoholism ([place])    Alzheimer's dementia versus alcohol related dementia. 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 Depression   . High cholesterol   . Hypertension   . Hypothyroidism (acquired)       Past Surgical History:  Procedure Laterality Date  . ELBOW SURGERY Left 1995   Broken  . TONSILLECTOMY  1942  . VARICOCELECTOMY  1967    Current Meds  Medication Sig  . alendronate (FOSAMAX) 70 MG tablet Take 70 mg by mouth once a week.   Marland Kitchen aspirin 81 MG tablet Take 81 mg by mouth daily.  Marland Kitchen atorvastatin (LIPITOR) 10 MG tablet Take 10 mg by mouth daily.  . calcium carbonate (OSCAL) 1500 (600 CA) MG TABS tablet Take 1,500 mg by mouth daily.  . Cholecalciferol (VITAMIN D3) 10000 UNITS TABS Take 1 tablet by mouth daily.  Marland Kitchen donepezil (ARICEPT) 10 MG tablet Take 1 tablet (10 mg total) by mouth 2 (two) times daily.  Marland Kitchen escitalopram (LEXAPRO) 20 MG tablet Take 20 mg by mouth daily.  Marland Kitchen levothyroxine (SYNTHROID, LEVOTHROID) 50 MCG tablet Take 50 mcg by mouth daily before breakfast.  . Multiple Vitamins-Minerals (MULTIVITAMIN ADULT PO) Take 1 tablet by mouth daily.  . tamsulosin (FLOMAX) 0.4 MG CAPS capsule Take 0.4 mg by mouth daily.    No Known Allergies  Social History   Social History  . Marital status: Married    Spouse name: susan  . Number of children: 2  . Years of education: 88   Occupational History  . 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 Smoking status: Former Smoker    Quit date: 03/28/1969  . Smokeless tobacco: Never Used  . Alcohol use 0.0 oz/week     Comment: 4-6 oz wine  . Drug use: No  . Sexual activity: Not Asked   Other Topics Concern  . None   Social History Narrative   Married father of 2, grandfather of 86, great grandfather of 72. He is married for 50 years.   Lives at home with wife.    Caffeine use: 1-2 cups per day   Drinks maybe 25-30 class of wine a week.   No routine exercise.   He grew up in [place]    family history includes Colon cancer in his mother; Leukemia in his father.  Wt Readings from Last 3 Encounters:  03/03/16 88 kg (194 lb)  07/29/15 88.2 kg (194 lb 6.4  oz)  01/29/15 90.1 kg (198 lb 9.6 oz)    PHYSICAL EXAM BP 105/63   Pulse 79   Ht 5\' 11"  (1.803 m)   Wt 88 kg (194 lb)   SpO2 99%   BMI 27.06 kg/m  General appearance: alert, cooperative, appears stated age, no distress.He is somewhat disheveled in appearance. Wears glasses with flip up sunglasses, poor historian with disjointed thoughts. Most of the history provided by his wife.  Neck: no adenopathy, no carotid bruit and no JVD Lungs: clear to auscultation bilaterally, normal percussion bilaterally and non-labored Heart: regular rate and rhythm, S1& S2 normal, no murmur, click, rub.  Cannot exclude S4 gallop versus split S2; nondisplaced PMI  Abdomen: soft, non-tender; bowel sounds normal; no masses,  no organomegaly; no HJR  Extremities:  extremities normal, atraumatic, no cyanosis, and edema - trace bilateral ankles  Pulses: 2+ and symmetric;  Skin: mobility and turgor normal, no evidence of bleeding or bruising and no lesions noted  Neurologic: Mental status: Alert, oriented, thought content appropriate Cranial nerves: normal (II-XII grossly intact); he has relatively poor insight and memory.  Overall unsteady gait, heel most all now walking into the office. Having to hold onto the wall.    Adult ECG Report N/a --> he declined  Other studies Reviewed: Additional studies/ records that were reviewed today include:  Recent Labs: Feb 15, 2016 from PCP  Na+ 138, K+ 4.6, Cl- 102, HCO3- 32 , BUN 21, Cr 1.1, Glu 97, Ca2+ 9.1; AST 30, ALT 22, AlkP 73, Alb 3.6, TP 6.5, T Bili 0.8  CBC: W 4.1, H/H 12.5/35.5 . Plt 163;; TSH 3.16, free T4 0.9, vitamin D 38.7  TC 167, TG 108, HDL 57 , LDL 88    ASSESSMENT / PLAN: Overall this is very difficult situation with an elderly gentleman who has significant gait instability and dementia. History is very difficult to obtain because he does not really answer questions and argues with his wife about questions. Per her reports, he has become more  profoundly dyspneic with exertion and she is very concerned about his instability. There had been occasions where he may have been holding his chest that she is interpreted as possible chest discomfort while dyspneic.  He does have some risk factors including a diagnosis of hypertension however he is no longer on medications and his blood pressure is low, hyperlipidemia, history of smoking etc. I do not think that he could ever walk on treadmill therefore treadmill exercise stress test would not be an acceptable option. I am worried about his exertional dyspnea and similarly his chest. On exam I heard a possible S4 gallop.  To evaluate for etiology for exertional dyspnea as a possible anginal equivalent with him holding his chest, we will order Lexiscan Myoview as well as an echocardiogram to rule out structural normality is with abnormal cardiac exam.  Problem List Items Addressed This Visit    Essential hypertension (Chronic)    Not really hypertensive today on no medications. He previously been on medications at it and stopped due to hypotension.      Relevant Orders   Myocardial Perfusion Imaging   HLD (hyperlipidemia) (Chronic)    On low-dose statin. With cognitive abnormalities, if his chest test is negative I would potentially consider stopping to avoid any complication.      DOE (dyspnea on exertion) - Primary    Probably multifactorial. Clearly he is deconditioned and is no longer going to do physical therapy because he has been refusing extreme gait instability. But simply taking a shower makes him short of breath. Plan:  evaluate for CHF versus ischemic etiology with Echo and Myoview stress test.      Relevant Orders   ECHOCARDIOGRAM COMPLETE   Myocardial Perfusion Imaging   Abnormal fourth heart sound (S4)    Partly because he had a strange breathing pattern and wouldn't stop talking during the physical exam, had a hard time hearing is heart sounds, but could not exclude an S4  gallop which would indicate potential hypertensive heart disease versus another cardiomyopathy.  Plan: 2-D echocardiogram      Relevant Orders   ECHOCARDIOGRAM COMPLETE   Myocardial Perfusion Imaging   Atypical chest pain    I'm not really sure how to interpret the description of chest discomfort as  being simply in holding his hand on his chest. However with the level of dyspnea and his inability to do any true exertion, I'm concerned that this could be a potential anginal equivalent. With his inability to walk on treadmill, we are limited to imaging stress test. Plan: Lexiscan Myoview      Relevant Orders   Myocardial Perfusion Imaging     This is a very difficult interview with both the patient and the wife talking over each other. She really provided mostly history. With chart review and charting and additional 15 minutes was spent. But otherwise close to an hour was spent directly with the patient. Greater than 50% of which was in direct counseling and listening.   Current medicines are reviewed at length with the patient today. (+/- concerns) None  The following changes have been made: None  Patient Instructions  SCHEDULE AT [facility] has requested that you have an echocardiogram. Echocardiography is a painless test that uses sound waves to create images of your heart. It provides your doctor with information about the size and shape of your heart and how well your heart's chambers and valves are working. This procedure takes approximately one hour. There are no restrictions for this procedure.   SCHEDULE AT [facility] has requested that you have a lexiscan myoview. For further information please visit [email or web address]. Please follow instruction sheet, as given.   Your physician recommends that you schedule a follow-up appointment in 1 MONTH WITH DR Dorlene Footman- F/U RESULTS  Studies Ordered:   Orders  Placed This Encounter  Procedures  . Myocardial Perfusion Imaging  . ECHOCARDIOGRAM COMPLETE     Glenetta Hew, M.D., M.S. Interventional Cardiologist   Pager # [phone] Phone # [phone] [address] [place], [place] [address]

## 2016-03-03 NOTE — Patient Instructions (Addendum)
SCHEDULE AT [facility] has requested that you have an echocardiogram. Echocardiography is a painless test that uses sound waves to create images of your heart. It provides your doctor with information about the size and shape of your heart and how well your heart's chambers and valves are working. This procedure takes approximately one hour. There are no restrictions for this procedure.   SCHEDULE AT [facility] has requested that you have a lexiscan myoview. For further information please visit [email or web address]. Please follow instruction sheet, as given.   Your physician recommends that you schedule a follow-up appointment in [facility]- F/U RESULTS

## 2016-03-05 ENCOUNTER — Encounter: Payer: Self-pay | Admitting: Cardiology

## 2016-03-05 DIAGNOSIS — R012 Other cardiac sounds: Secondary | ICD-10-CM | POA: Insufficient documentation

## 2016-03-05 DIAGNOSIS — R0609 Other forms of dyspnea: Principal | ICD-10-CM

## 2016-03-05 DIAGNOSIS — R0789 Other chest pain: Secondary | ICD-10-CM | POA: Insufficient documentation

## 2016-03-05 NOTE — Assessment & Plan Note (Signed)
I'm not really sure how to interpret the description of chest discomfort as being simply in holding his hand on his chest. However with the level of dyspnea and his inability to do any true exertion, I'm concerned that this could be a potential anginal equivalent. With his inability to walk on treadmill, we are limited to imaging stress test. Plan: Lexiscan Myoview

## 2016-03-05 NOTE — Assessment & Plan Note (Signed)
Probably multifactorial. Clearly he is deconditioned and is no longer going to do physical therapy because he has been refusing extreme gait instability. But simply taking a shower makes him short of breath. Plan:  evaluate for CHF versus ischemic etiology with Echo and Myoview stress test.

## 2016-03-05 NOTE — Assessment & Plan Note (Signed)
Partly because he had a strange breathing pattern and wouldn't stop talking during the physical exam, had a hard time hearing is heart sounds, but could not exclude an S4 gallop which would indicate potential hypertensive heart disease versus another cardiomyopathy.  Plan: 2-D echocardiogram

## 2016-03-05 NOTE — Assessment & Plan Note (Signed)
On low-dose statin. With cognitive abnormalities, if his chest test is negative I would potentially consider stopping to avoid any complication.

## 2016-03-05 NOTE — Assessment & Plan Note (Signed)
Not really hypertensive today on no medications. He previously been on medications at it and stopped due to hypotension.

## 2016-03-09 ENCOUNTER — Telehealth (HOSPITAL_COMMUNITY): Payer: Self-pay

## 2016-03-09 NOTE — Telephone Encounter (Signed)
Encounter complete. 

## 2016-03-11 ENCOUNTER — Ambulatory Visit (HOSPITAL_COMMUNITY)
Admission: RE | Admit: 2016-03-11 | Discharge: 2016-03-11 | Disposition: A | Discharge status: Home / Self Care | Payer: Medicare Other | Source: Ambulatory Visit | Attending: Cardiology | Admitting: Cardiology

## 2016-03-11 DIAGNOSIS — R0789 Other chest pain: Secondary | ICD-10-CM

## 2016-03-11 DIAGNOSIS — I10 Essential (primary) hypertension: Secondary | ICD-10-CM | POA: Diagnosis not present

## 2016-03-11 DIAGNOSIS — R0609 Other forms of dyspnea: Secondary | ICD-10-CM | POA: Diagnosis not present

## 2016-03-11 DIAGNOSIS — R012 Other cardiac sounds: Secondary | ICD-10-CM | POA: Diagnosis not present

## 2016-03-11 LAB — MYOCARDIAL PERFUSION IMAGING
CHL CUP NUCLEAR SDS: 2
CHL CUP NUCLEAR SRS: 1
CHL CUP RESTING HR STRESS: 60 {beats}/min
CSEPPHR: 73 {beats}/min
LV dias vol: 96 mL (ref 62–150)
LV sys vol: 35 mL
SSS: 3
TID: 1.15

## 2016-03-11 MED ORDER — TECHNETIUM TC 99M TETROFOSMIN IV KIT
30.4000 | PACK | Freq: Once | INTRAVENOUS | Status: AC | PRN
  Administered 2016-03-11: 30.4 via INTRAVENOUS
  Filled 2016-03-11: qty 31

## 2016-03-11 MED ORDER — TECHNETIUM TC 99M TETROFOSMIN IV KIT
10.7000 | PACK | Freq: Once | INTRAVENOUS | Status: AC | PRN
  Administered 2016-03-11: 10.7 via INTRAVENOUS
  Filled 2016-03-11: qty 11

## 2016-03-11 MED ORDER — REGADENOSON 0.4 MG/5ML IV SOLN
0.4000 mg | Freq: Once | INTRAVENOUS | Status: AC
  Administered 2016-03-11: 0.4 mg via INTRAVENOUS

## 2016-03-15 ENCOUNTER — Telehealth: Payer: Self-pay | Admitting: *Deleted

## 2016-03-15 NOTE — Telephone Encounter (Signed)
LEFT MESSAGE TO CALL BACK IN REGARDS TO MYOVIEW RESULTS

## 2016-03-15 NOTE — Telephone Encounter (Signed)
Spencer Howard is returning your call . Thanks

## 2016-03-15 NOTE — Telephone Encounter (Signed)
-----   Message from Leonie Man, MD sent at 03/14/2016  6:51 PM EST ----- Normal stress test. This is low risk study with low likelihood of having active ischemic heart disease. Normal pulmonary function. No evidence of prior infarct.  Glenetta Hew, MD

## 2016-03-15 NOTE — Telephone Encounter (Signed)
Spoke to patien'ts wife. Result given . Verbalized understanding  

## 2016-03-25 ENCOUNTER — Other Ambulatory Visit: Payer: Self-pay

## 2016-03-25 ENCOUNTER — Ambulatory Visit (HOSPITAL_COMMUNITY): Payer: Medicare Other | Attending: Cardiovascular Disease

## 2016-03-25 DIAGNOSIS — R0609 Other forms of dyspnea: Secondary | ICD-10-CM | POA: Diagnosis not present

## 2016-03-25 DIAGNOSIS — I351 Nonrheumatic aortic (valve) insufficiency: Secondary | ICD-10-CM | POA: Insufficient documentation

## 2016-03-25 DIAGNOSIS — R012 Other cardiac sounds: Secondary | ICD-10-CM | POA: Diagnosis not present

## 2016-04-12 DIAGNOSIS — R413 Other amnesia: Secondary | ICD-10-CM | POA: Diagnosis not present

## 2016-04-20 ENCOUNTER — Ambulatory Visit (INDEPENDENT_AMBULATORY_CARE_PROVIDER_SITE_OTHER): Payer: Medicare Other | Admitting: Cardiology

## 2016-04-20 DIAGNOSIS — R0609 Other forms of dyspnea: Secondary | ICD-10-CM | POA: Diagnosis not present

## 2016-04-20 DIAGNOSIS — I10 Essential (primary) hypertension: Secondary | ICD-10-CM

## 2016-04-20 DIAGNOSIS — R012 Other cardiac sounds: Secondary | ICD-10-CM

## 2016-04-20 DIAGNOSIS — R0789 Other chest pain: Secondary | ICD-10-CM

## 2016-04-20 NOTE — Progress Notes (Signed)
PCP: Haywood Pao, MD  Clinic Note: Chief Complaint  Patient presents with  . Follow-up    pt denied chest pain and swelling in legs, pt c/o little SOB    HPI: Spencer Howard is a 83 y.o. male with a PMH below who presents today for Follow-up cardiac evaluation to evaluate exertional dyspnea and atypical sounding chest discomfort evaluated with an echocardiogram and Myoview.[name] was seen for consultation back on December 7. He now reports presents back to discuss the results of his tests.  Recent Hospitalizations: None  Studies Reviewed: See [facility] for: 2-D echocardiogram & Myoview   Interval History: Spencer Howard presents today pretty much doing the same. He is notably dad with memory. He is a very poor historian. Most of this history is provided by his wife. He still has dyspnea walking when he walks around, but is not a change from before. He doesn't really notice it, he basically walks around the house and no other significant exercise. They used to walk together, but have gotten out of the habit since the weather gets bad. He has a typical right sided chest pain that is stabbing in nature. No more left chest discomfort  No chest pain or pressure with rest or exertion.  No PND, orthopnea or edema.  No palpitations, lightheadedness, dizziness, weakness or syncope/near syncope. No TIA/amaurosis fugax symptoms. No melena, hematochezia, hematuria, or epstaxis. No claudication.  ROS: A comprehensive was performed. Review of Systems  HENT: Positive for hearing loss. Negative for congestion and nosebleeds.   Respiratory: Negative for cough and shortness of breath.   Cardiovascular:       Per history of present illness  Gastrointestinal: Negative for blood in stool, heartburn and melena.  Musculoskeletal: Positive for falls (Due to unstable gait) and joint pain. Negative for myalgias.  Skin: Negative.   Neurological: Positive for dizziness and weakness.       Very poor  balance with unstable gait  Psychiatric/Behavioral: Positive for memory loss.  All other systems reviewed and are negative.   Past Medical History:  Diagnosis Date  . Alcohol abuse with alcohol-induced mental disorder (Spencer Howard)   . Dementia associated with alcoholism ([place])    Alzheimer's dementia versus alcohol related dementia. 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 Depression   . High cholesterol   . Hypertension   . Hypothyroidism (acquired)     Past Surgical History:  Procedure Laterality Date  . ELBOW SURGERY Left 1995   Broken  . NM MYOVIEW LTD  02/2016   Normal LV function - EF 60-65%. No EKG changes. No ischemia or infarction noted on imaging. LOW RISK  . TONSILLECTOMY  1942  . TRANSTHORACIC ECHOCARDIOGRAM  02/2016   Moderate concentric LVH with EF 55-60%. GR 1 DD. Mild LA dilation. Mildly increased PA pressures  . VARICOCELECTOMY  1967    Current Meds  Medication Sig  . alendronate (FOSAMAX) 70 MG tablet Take 70 mg by mouth once a week.   Marland Kitchen aspirin 81 MG tablet Take 81 mg by mouth daily.  Marland Kitchen atorvastatin (LIPITOR) 10 MG tablet Take 10 mg by mouth daily.  . calcium carbonate (OSCAL) 1500 (600 CA) MG TABS tablet Take 1,500 mg by mouth daily.  . Cholecalciferol (VITAMIN D3) 10000 UNITS TABS Take 1 tablet by mouth daily.  Marland Kitchen donepezil (ARICEPT) 10 MG tablet Take 1 tablet (10 mg total) by mouth 2 (two) times daily.  Marland Kitchen escitalopram (LEXAPRO) 20 MG tablet Take 20  mg by mouth daily.  Marland Kitchen levothyroxine (SYNTHROID, LEVOTHROID) 50 MCG tablet Take 50 mcg by mouth daily before breakfast.  . Multiple Vitamins-Minerals (MULTIVITAMIN ADULT PO) Take 1 tablet by mouth daily.  . tamsulosin (FLOMAX) 0.4 MG CAPS capsule Take 0.4 mg by mouth daily.    No Known Allergies  Social History   Social History  . Marital status: Married    Spouse name: susan  . Number of children: 2  . Years of education: 69   Occupational History  . 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 Smoking status: Former Smoker    Quit date: 03/28/1969  . Smokeless tobacco: Never Used  . Alcohol use 0.0 oz/week     Comment: 4-6 oz wine  . Drug use: No  . Sexual activity: Not Asked   Other Topics Concern  . None   Social History Narrative   Married father of 2, grandfather of 57, great grandfather of 4. He is married for 50 years.   Lives at home with wife.    Caffeine use: 1-2 cups per day   Drinks maybe 25-30 class of wine a week.   No routine exercise.   He grew up in [place]    family history includes Colon cancer in his mother; Leukemia in his father.  Wt Readings from Last 3 Encounters:  04/20/16 89.4 kg (197 lb 3.2 oz)  03/11/16 88 kg (194 lb)  03/03/16 88 kg (194 lb)    PHYSICAL EXAM BP 107/61   Pulse 73   Ht 5\' 10"  (1.778 m)   Wt 89.4 kg (197 lb 3.2 oz)   BMI 28.30 kg/m  General appearance: alert, cooperative, appears stated age, no distress.He is somewhat disheveled in appearance. Wears glasses with flip up sunglasses, poor historian with disjointed thoughts. Most of the history provided by his wife.  Neck: no adenopathy, no carotid bruit and no JVD Lungs: clear to auscultation bilaterally, normal percussion bilaterally and non-labored Heart: regular rate and rhythm, S1& S2 normal, no murmur, click, rub.  Cannot exclude S4 gallop versus split S2; nondisplaced PMI  Abdomen: soft, non-tender; bowel sounds normal; no masses,  no organomegaly; no HJR  Extremities: extremities normal, atraumatic, no cyanosis, and edema - trace bilateral ankles  Pulses: 2+ and symmetric;  Skin: mobility and turgor normal, no evidence of bleeding or bruising and no lesions Okay to follow-up when necessary noted  Neurologic: Mental status: Alert, oriented - poor historian.    Adult ECG Report n/a  Other studies Reviewed: Additional studies/ records that were reviewed today include:  Recent  Labs:  n/a    ASSESSMENT / PLAN: No further cardiac evaluation warranted.   Problem List Items Addressed This Visit    Essential hypertension (Chronic)    Controlled on no medications. Not really hypertensive.      DOE (dyspnea on exertion)    Clearly multifactorial with deconditioning and unstable gait. Not really doing much in the way of any exercise. He stopped doing any activity after doing his physical therapy. Normal Myoview and normal echo. Low likelihood of having acute cardiac etiology. No further cardiac evaluation warranted.      Abnormal fourth heart sound (S4)    No significant diastolic dysfunction noted on echo. Nothing to suggest hypertensive heart disease      Atypical chest pain    Low risk Myoview. Nonanginal chest pain  Current medicines are reviewed at length with the patient today. (+/- concerns) n/a The following changes have been made: n/a  Patient Instructions  NO CHANGE WITH CURRENT TREATMENT     Your physician recommends that you schedule a follow-up appointment AS NEEDED    Studies Ordered:   No orders of the defined types were placed in this encounter.     Glenetta Hew, M.D., M.S. Interventional Cardiologist   Pager # [phone] Phone # [phone] [address] [place], [place] [address]

## 2016-04-20 NOTE — Patient Instructions (Signed)
NO CHANGE WITH CURRENT TREATMENT     Your physician recommends that you schedule a follow-up appointment AS NEEDED

## 2016-04-22 ENCOUNTER — Encounter: Payer: Self-pay | Admitting: Cardiology

## 2016-04-22 NOTE — Assessment & Plan Note (Signed)
No significant diastolic dysfunction noted on echo. Nothing to suggest hypertensive heart disease

## 2016-04-22 NOTE — Assessment & Plan Note (Signed)
Controlled on no medications. Not really hypertensive.

## 2016-04-22 NOTE — Assessment & Plan Note (Signed)
Clearly multifactorial with deconditioning and unstable gait. Not really doing much in the way of any exercise. He stopped doing any activity after doing his physical therapy. Normal Myoview and normal echo. Low likelihood of having acute cardiac etiology. No further cardiac evaluation warranted.

## 2016-04-22 NOTE — Assessment & Plan Note (Signed)
Low risk Myoview. Nonanginal chest pain

## 2016-05-16 ENCOUNTER — Telehealth: Payer: Self-pay | Admitting: Neurology

## 2016-05-16 ENCOUNTER — Encounter: Payer: Self-pay | Admitting: Neurology

## 2016-05-16 ENCOUNTER — Ambulatory Visit (INDEPENDENT_AMBULATORY_CARE_PROVIDER_SITE_OTHER): Payer: Medicare Other | Admitting: Neurology

## 2016-05-16 VITALS — BP 94/54 | HR 68 | Ht 70.0 in | Wt 195.2 lb

## 2016-05-16 DIAGNOSIS — F332 Major depressive disorder, recurrent severe without psychotic features: Secondary | ICD-10-CM

## 2016-05-16 DIAGNOSIS — Z7289 Other problems related to lifestyle: Secondary | ICD-10-CM

## 2016-05-16 DIAGNOSIS — R2689 Other abnormalities of gait and mobility: Secondary | ICD-10-CM

## 2016-05-16 DIAGNOSIS — R4189 Other symptoms and signs involving cognitive functions and awareness: Secondary | ICD-10-CM | POA: Diagnosis not present

## 2016-05-16 DIAGNOSIS — R531 Weakness: Secondary | ICD-10-CM | POA: Diagnosis not present

## 2016-05-16 DIAGNOSIS — R269 Unspecified abnormalities of gait and mobility: Secondary | ICD-10-CM

## 2016-05-16 DIAGNOSIS — W19XXXA Unspecified fall, initial encounter: Secondary | ICD-10-CM

## 2016-05-16 MED ORDER — DONEPEZIL HCL 10 MG PO TABS: 10.0000 mg | ORAL_TABLET | Freq: Two times a day (BID) | ORAL | 4 refills | Status: DC

## 2016-05-16 MED ORDER — ESCITALOPRAM OXALATE 20 MG PO TABS: 20.0000 mg | ORAL_TABLET | Freq: Every day | ORAL | 4 refills | Status: DC

## 2016-05-16 NOTE — Telephone Encounter (Signed)
Hinton Dyer, I placed a referral for psychiatry, can we refer to Dr. [name] at [facility]? Thanks!

## 2016-05-16 NOTE — Progress Notes (Addendum)
[facility]    Provider:  Dr Jaynee Eagles Referring Provider: Osborne Casco Fransico Him, MD Primary Care Physician:  Haywood Pao, MD  CC: Memory loss  Interval history 05/16/2016: Patient returns for follow up. 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 Had a long discussion regarding MCI, causes and risks for progression to Alzheimers and other dementias. He has had 1-2 falls since appointment, worsening gait. He went to PT but he is not doing the recommended exercises at home. Falls, worsened cognition and imbalance, can;t leave the house alone, he is not driving anymore.  They have several issues to discuss today:  Drinking for the last year: he drinks a 789ml bottle of wine every 2 days. Been ongoing. Recommend max 2 glasses a day. However stopping abruptly can cause withdrawal and risk of death. F/u with Dr. Osborne Casco  Inactivity and imbalance and falls: encouraged activity, silver sneakers. Home nursing and PT at home.  Self mutilation, extreme agitation, depression, death of daughter: Recommend psychiatry Dr. [name] at [facility], he needs medical management but they also need family counseling for his MCI likely of Alzheimer's type or depression or both.   Driving; recommend no driving  Incontinent: f/u with Dr. Osborne Casco  F/u Dr. Vikki Ports for review of neuropsych testing   Interval History 07/29/2015: Short term memory is worsening. He is highly dependent on his watch. He forgets relationships of family members and grandchildren and great grandchildren. He is having balance problems, he staggers a lot, near falls, mostly in th emorning when he gets up, he gets up too fast and his blood pressure is low and gets dizzy. No numbness or tingling in the feet. Had recent bloodwork witll request from Dr. Osborne Casco.   HPI: Zebulun Deman is a 83 y.o. male here as a referral from Dr. Osborne Casco for for dementia. PMHx  memory loss, HTN, HLD, depression, hypothyroidism. Per a review of records, He has been experiencing short-term memory loss. Wife here with patient and provides information. Patient does not recall conversations, repeatedly asks questions, forgets appointments and needs reminding every day of daily plans. He forgets to take his medications. He gets lost when he drives. He uses GPS now and wife is always with him. He got to the golf course [place]. Wife manages the finances. Symptoms started 3 years ago and slow progression. He did experience significant depression after the loss of his daughter. A component of depression/anxiety was suspected. As such, a complete neuropsychological evaluation including labs and brain imaging was ordered to differentiate depression from a neurodegenerative disorder. Brain imaging and blood work were unrevealing and the MRI of the brain showed only "stable chronic small vessel changes". He was placed on Lexapro for depression. 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 A thorough neuropsychological evaluation was performed and mild cognitive impairment was diagnosed and not suggestive of dementia at this time. He is on Aricept. Further, the psychological issues is not the cause of his cognitive difficulties however it affects his quality of life and exacerbates existing cognitive deficits.  He just moved into the area and is here to establish care. He is using [employer]. He is a very pleasant and conversant gentleman here with his wife who provides just as much information as patient does. He says he sometimes asks for directions. He moved and this has been stressful but they are thrilled to be here around family. Memory changes started 3 years ago. It  has been very slowly progressive. More recent than remote memories. He does not know the plans for the day, he says the same things over and over again, asks the same questions over and over. He gets frutrated  and swears more but this is not a change in personality, just a little worse than he has been in the past. He remebers in the 7th grade he used to get checks for self control. He is better with his depression. His pcp is managing his depression, he is better on the Lexapro. The other day he got really mad but again he has done this in the past. One glass of wine a day. He has some bouts of frustration or anger. He denies inappropriate bouts of crying or laughing or other pseudobulbar symptoms. He does get angry but this is not something new.   Reviewed notes, labs and imaging from outside physicians, which showed: Neuropsychological evaluation was performed in October 2016. [name] profile of findings appears to be best characterized as mild cognitive impairment, amnestic type. He demonstrated mild difficulty with simple attention and performance on measures of psychomotor speed was variable. He was able to understand directions for most measures but he did have trouble comprehending instructions for some of the more complicated tasks. He did not demonstrate the disorientation, deficient abstract reasoning, concrete clock drawing, impaired categorical fluency, reduced judgment and poor mental flexibility often observed in people with cortical dementia. And although he experiences memory lapses and his uvula, he is not demonstrating generalized functional impairment. This despite his memory decline Mr. Longman does not meet full criteria for cortical dementia i.e. Alzheimer's disease at this time. However Mr. Platte demonstrated greater memory loss that is typical for a person of his age. Small vessel ischemia would not cause the circumscribed memory deficits demonstrated by the patient. 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 Repeat neuropsychological assessment in 12 months is strongly recommended to assist with differential diagnosis.  This is particularly important given that amnestic mild cognitive impairment is increasingly thought to represent the early stages of dementia. Worsening of cognitive function in the context of stable cerebrovascular and overall health would provide strong evidence of a developing dementia.   Review of Systems: Patient complains of symptoms per HPI as well as the following symptoms: memory loss, confusion, depression, anxiety, decreased energy, change in appetite, disinterest in avtivities. Pertinent negatives per HPI. All others negative.   Social History   Social History  . Marital status: Married    Spouse name: susan  . Number of children: 2  . Years of education: 43   Occupational History  . Retired     Retired Transport planner. [employer] and then worked for [employer]  . Smoking status: Former Smoker    Quit date: 03/28/1969  . Smokeless tobacco: Never Used  . Alcohol use 0.0 oz/week     Comment: 4-6 oz wine  . Drug use: No  . Sexual activity: Not on file   Other Topics Concern  . Not on file   Social History Narrative   Married father of 2, grandfather of 61, great grandfather of 35. He is married for 50 years.   Lives at home with wife.    Caffeine use: 1-2 cups per day   Drinks maybe 25-30 glasses of wine a week.   No routine exercise.   He grew up in [place]  Writes right-handed, throws left-handed    Family History  Problem Relation Age of Onset  . Leukemia Father   . Colon cancer Mother   . Dementia Neg Hx     Past Medical History:  Diagnosis Date  . Alcohol abuse with alcohol-induced mental disorder ([place])   . Dementia associated with alcoholism ([place])    Alzheimer's dementia versus alcohol related dementia. 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 Depression   . High cholesterol   . Hypertension   . Hypothyroidism (acquired)     Past Surgical History:  Procedure Laterality Date  . ELBOW  SURGERY Left 1995   Broken  . NM MYOVIEW LTD  02/2016   Normal LV function - EF 60-65%. No EKG changes. No ischemia or infarction noted on imaging. LOW RISK  . TONSILLECTOMY  1942  . TRANSTHORACIC ECHOCARDIOGRAM  02/2016   Moderate concentric LVH with EF 55-60%. GR 1 DD. Mild LA dilation. Mildly increased PA pressures  . VARICOCELECTOMY  1967    Current Outpatient Prescriptions  Medication Sig Dispense Refill  . alendronate (FOSAMAX) 70 MG tablet Take 70 mg by mouth once a week.     Marland Kitchen aspirin 81 MG tablet Take 81 mg by mouth daily.    Marland Kitchen atorvastatin (LIPITOR) 10 MG tablet Take 10 mg by mouth daily.    . calcium carbonate (OSCAL) 1500 (600 CA) MG TABS tablet Take 1,500 mg by mouth daily.    . Cholecalciferol (VITAMIN D3) 10000 UNITS TABS Take 1 tablet by mouth daily.    Marland Kitchen donepezil (ARICEPT) 10 MG tablet Take 1 tablet (10 mg total) by mouth 2 (two) times daily. 180 tablet 4  . escitalopram (LEXAPRO) 20 MG tablet Take 20 mg by mouth daily.    Marland Kitchen levothyroxine (SYNTHROID, LEVOTHROID) 50 MCG tablet Take 50 mcg by mouth daily before breakfast.    . Multiple Vitamins-Minerals (MULTIVITAMIN ADULT PO) Take 1 tablet by mouth daily.    . tamsulosin (FLOMAX) 0.4 MG CAPS capsule Take 0.4 mg by mouth daily.     No current facility-administered medications for this visit.     Allergies as of 05/16/2016  . (No Known Allergies)    Vitals: BP (!) 94/54 (BP Location: Right Arm)   Pulse 68   Ht 5\' 10"  (1.778 m)   Wt 195 lb 3.2 oz (88.5 kg)   BMI 28.01 kg/m  Last Weight:  Wt Readings from Last 1 Encounters:  05/16/16 195 lb 3.2 oz (88.5 kg)   Last Height:   Ht Readings from Last 1 Encounters:  05/16/16 5\' 10"  (1.778 m)   MMSE - Mini Mental State Exam 05/16/2016 07/29/2015  Orientation to time 1 5  Orientation to Place 3 2  Registration 3 3  Attention/ Calculation 5 5  Recall 2 3  Language- name 2 objects 2 2  Language- repeat 0 0  Language- follow 3 step command 3 3  Language- read &  follow direction 1 1  Write a sentence 1 1  Copy design 1 1  Total score 22 26    Cranial Nerves:  The pupils are equal, round, and reactive to light. The fundi are normal and spontaneous venous pulsations are present. Visual fields are full to finger confrontation. Extraocular movements are intact. Trigeminal sensation is intact and the muscles of mastication are normal. The face is symmetric. The palate elevates in the midline. Hearing intact. Voice is normal. Shoulder shrug is normal. The tongue has normal motion without  fasciculations.   Coordination:  Normal finger to nose and heel to shin. Normal rapid alternating movements.   Gait:  Stands with imbalance  Motor Observation:  No asymmetry, no atrophy, and no involuntary movements noted. Tone:  Normal muscle tone.   Posture:  Posture is normal. normal erect   Strength:  Strength is 4/5 in the upper and lower limbs.    Sensation: intact to LT       Assessment/Plan: 83 year old male here with MCI amnestic type possibly Alzheimer's prodrome or depression or both, unclear. Neuro exam with imbalance, recent falls. Extensive workup from previous neurologist and neuropsychological testing c/w MCI complicated by depression.Here with wife of 39 years. Continues to progress, recent neuropsych testing confirms cognitive impairment  -Cognitive impairment: Continue Aricept - Drinking for the last year: he drinks a 765ml bottle of wine every 2 days. Been ongoing. Recommend max 2 glasses a day. However stopping abruptly can cause withdrawal and risk of death. F/u with Dr. Osborne Casco - Inactivity and imbalance and falls: encouraged activity, silver sneakers. Home nursing and PT at home. - Self mutilation, extreme agitation, depression, death of daughter: Recommend psychiatry Dr. [name] at [facility], he needs medical management but they also need family counseling for his MCI likely of Alzheimer's type vs  depression or both.  - Driving; recommend no driving - Incontinent: f/u with Dr. Osborne Casco - f/u with Dr. Vikki Ports for review of recent neuropsych testing  B12 collected 06/15/2015 678, CMP unremarkable except creatinine 1.3, TSH 3.13 normal  Labs 11/11/2016: CMP: BUN 22, creatinine 1.2, MCV elevated at 11 otherwise unremarkable. TSH normal. Sarina Ill, MD  [facility] [address] [place],  [address]  Phone [phone] Fax [phone]  A total of 30 minutes was spent face-to-face with this patient. Over half this time was spent on counseling patient on the mild cognitive impairment, alcohol abuse, imbalance, weakness, falls diagnosis and different diagnostic and therapeutic options available.

## 2016-05-16 NOTE — Patient Instructions (Addendum)
Remember to drink plenty of fluid, eat healthy meals and do not skip any meals. Try to eat protein with a every meal and eat a healthy snack such as fruit or nuts in between meals. Try to keep a regular sleep-wake schedule and try to exercise daily, particularly in the form of walking, 20-30 minutes a day, if you can.   As far as your medications are concerned, I would like to suggest: Continue Aricept and Lexapro  As far as diagnostic testing: Can consider an FDG-PET Scan, Needs psychiatry and therapy Dr. [name]  I would like to see you back in 6 months, sooner if we need to. Please call us with any interim questions, concerns, problems, updates or refill requests.   Our phone number is [phone]. We also have an after hours call service for urgent matters and there is a physician on-call for urgent questions. For any emergencies you know to call 911 or go to the nearest emergency room   [facility] [address]  [place],  [address] 10:00 AM Gwendolyn Fill, 2nd Thursday of each month Please RSVP with Christianna Lovena Le, [facility] Caregiver Support Coordinator [phone] or [email or web address] on [employer] Second Wednesday of every month from 10am - noon A support group for any caregivers or family members dealing with illnesses

## 2016-05-17 NOTE — Telephone Encounter (Signed)
Referral was sent to [name] Telephone [phone] - fax [phone]. Thanks Hinton Dyer.

## 2016-06-10 ENCOUNTER — Telehealth: Payer: Self-pay | Admitting: Neurology

## 2016-06-10 NOTE — Telephone Encounter (Signed)
Pt's wife called back and said [name] does not have referral and to please send again. Thank you

## 2016-06-10 NOTE — Telephone Encounter (Signed)
Pt's wife called said they have not heard anything regarding psych referral or home health. I gave her the phone # [phone] and advised the referral was faxed on 2/20, she could call and schedule appt. I apologized she had not been contacted from that office. They have not been contacted for home health

## 2016-06-13 NOTE — Telephone Encounter (Signed)
error 

## 2016-06-13 NOTE — Telephone Encounter (Signed)
Andee Poles can you fax this please [facility] in [place]. Thanks dana

## 2016-06-15 NOTE — Telephone Encounter (Signed)
Referral has been sent via fax.

## 2016-07-26 DIAGNOSIS — F0634 Mood disorder due to known physiological condition with mixed features: Secondary | ICD-10-CM | POA: Diagnosis not present

## 2016-07-26 DIAGNOSIS — G3184 Mild cognitive impairment, so stated: Secondary | ICD-10-CM | POA: Diagnosis not present

## 2016-08-16 DIAGNOSIS — F0634 Mood disorder due to known physiological condition with mixed features: Secondary | ICD-10-CM | POA: Diagnosis not present

## 2016-08-16 DIAGNOSIS — F959 Tic disorder, unspecified: Secondary | ICD-10-CM | POA: Diagnosis not present

## 2016-08-16 DIAGNOSIS — G3184 Mild cognitive impairment, so stated: Secondary | ICD-10-CM | POA: Diagnosis not present

## 2016-08-25 DIAGNOSIS — Z961 Presence of intraocular lens: Secondary | ICD-10-CM | POA: Diagnosis not present

## 2016-08-25 DIAGNOSIS — H33303 Unspecified retinal break, bilateral: Secondary | ICD-10-CM | POA: Diagnosis not present

## 2016-08-25 DIAGNOSIS — H524 Presbyopia: Secondary | ICD-10-CM | POA: Diagnosis not present

## 2016-08-25 DIAGNOSIS — H1789 Other corneal scars and opacities: Secondary | ICD-10-CM | POA: Diagnosis not present

## 2016-10-17 DIAGNOSIS — G3184 Mild cognitive impairment, so stated: Secondary | ICD-10-CM | POA: Diagnosis not present

## 2016-10-17 DIAGNOSIS — F959 Tic disorder, unspecified: Secondary | ICD-10-CM | POA: Diagnosis not present

## 2016-10-17 DIAGNOSIS — F0634 Mood disorder due to known physiological condition with mixed features: Secondary | ICD-10-CM | POA: Diagnosis not present

## 2016-10-31 ENCOUNTER — Emergency Department (HOSPITAL_COMMUNITY): Payer: Medicare Other

## 2016-10-31 ENCOUNTER — Telehealth: Payer: Self-pay | Admitting: Neurology

## 2016-10-31 ENCOUNTER — Encounter (HOSPITAL_COMMUNITY): Payer: Self-pay | Admitting: Emergency Medicine

## 2016-10-31 ENCOUNTER — Inpatient Hospital Stay (HOSPITAL_COMMUNITY)
Admission: EM | Admit: 2016-10-31 | Discharge: 2016-11-04 | DRG: 312 | Disposition: A | Discharge status: Home Health | Payer: Medicare Other | Attending: Internal Medicine | Admitting: Internal Medicine

## 2016-10-31 DIAGNOSIS — Z7982 Long term (current) use of aspirin: Secondary | ICD-10-CM

## 2016-10-31 DIAGNOSIS — S62395A Other fracture of fourth metacarpal bone, left hand, initial encounter for closed fracture: Secondary | ICD-10-CM | POA: Diagnosis not present

## 2016-10-31 DIAGNOSIS — E7800 Pure hypercholesterolemia, unspecified: Secondary | ICD-10-CM | POA: Diagnosis present

## 2016-10-31 DIAGNOSIS — I951 Orthostatic hypotension: Principal | ICD-10-CM | POA: Diagnosis present

## 2016-10-31 DIAGNOSIS — S20219A Contusion of unspecified front wall of thorax, initial encounter: Secondary | ICD-10-CM | POA: Diagnosis not present

## 2016-10-31 DIAGNOSIS — S20212A Contusion of left front wall of thorax, initial encounter: Secondary | ICD-10-CM

## 2016-10-31 DIAGNOSIS — S62397A Other fracture of fifth metacarpal bone, left hand, initial encounter for closed fracture: Secondary | ICD-10-CM | POA: Diagnosis not present

## 2016-10-31 DIAGNOSIS — R404 Transient alteration of awareness: Secondary | ICD-10-CM | POA: Diagnosis not present

## 2016-10-31 DIAGNOSIS — F1010 Alcohol abuse, uncomplicated: Secondary | ICD-10-CM | POA: Diagnosis present

## 2016-10-31 DIAGNOSIS — S62317A Displaced fracture of base of fifth metacarpal bone. left hand, initial encounter for closed fracture: Secondary | ICD-10-CM | POA: Diagnosis not present

## 2016-10-31 DIAGNOSIS — E785 Hyperlipidemia, unspecified: Secondary | ICD-10-CM | POA: Diagnosis present

## 2016-10-31 DIAGNOSIS — G3184 Mild cognitive impairment, so stated: Secondary | ICD-10-CM | POA: Diagnosis present

## 2016-10-31 DIAGNOSIS — E784 Other hyperlipidemia: Secondary | ICD-10-CM | POA: Diagnosis not present

## 2016-10-31 DIAGNOSIS — I10 Essential (primary) hypertension: Secondary | ICD-10-CM | POA: Diagnosis present

## 2016-10-31 DIAGNOSIS — E038 Other specified hypothyroidism: Secondary | ICD-10-CM | POA: Diagnosis not present

## 2016-10-31 DIAGNOSIS — Z87891 Personal history of nicotine dependence: Secondary | ICD-10-CM

## 2016-10-31 DIAGNOSIS — S0990XA Unspecified injury of head, initial encounter: Secondary | ICD-10-CM | POA: Diagnosis not present

## 2016-10-31 DIAGNOSIS — F482 Pseudobulbar affect: Secondary | ICD-10-CM | POA: Diagnosis present

## 2016-10-31 DIAGNOSIS — S6292XA Unspecified fracture of left wrist and hand, initial encounter for closed fracture: Secondary | ICD-10-CM

## 2016-10-31 DIAGNOSIS — F329 Major depressive disorder, single episode, unspecified: Secondary | ICD-10-CM | POA: Diagnosis present

## 2016-10-31 DIAGNOSIS — W010XXA Fall on same level from slipping, tripping and stumbling without subsequent striking against object, initial encounter: Secondary | ICD-10-CM | POA: Diagnosis present

## 2016-10-31 DIAGNOSIS — R079 Chest pain, unspecified: Secondary | ICD-10-CM | POA: Diagnosis not present

## 2016-10-31 DIAGNOSIS — E039 Hypothyroidism, unspecified: Secondary | ICD-10-CM | POA: Diagnosis present

## 2016-10-31 DIAGNOSIS — S62305A Unspecified fracture of fourth metacarpal bone, left hand, initial encounter for closed fracture: Secondary | ICD-10-CM | POA: Diagnosis present

## 2016-10-31 DIAGNOSIS — S62337A Displaced fracture of neck of fifth metacarpal bone, left hand, initial encounter for closed fracture: Secondary | ICD-10-CM | POA: Diagnosis not present

## 2016-10-31 DIAGNOSIS — F0390 Unspecified dementia without behavioral disturbance: Secondary | ICD-10-CM | POA: Diagnosis present

## 2016-10-31 DIAGNOSIS — S62307A Unspecified fracture of fifth metacarpal bone, left hand, initial encounter for closed fracture: Secondary | ICD-10-CM | POA: Diagnosis present

## 2016-10-31 DIAGNOSIS — R55 Syncope and collapse: Secondary | ICD-10-CM | POA: Diagnosis present

## 2016-10-31 DIAGNOSIS — Z79899 Other long term (current) drug therapy: Secondary | ICD-10-CM

## 2016-10-31 DIAGNOSIS — S62325A Displaced fracture of shaft of fourth metacarpal bone, left hand, initial encounter for closed fracture: Secondary | ICD-10-CM | POA: Diagnosis not present

## 2016-10-31 DIAGNOSIS — Y92009 Unspecified place in unspecified non-institutional (private) residence as the place of occurrence of the external cause: Secondary | ICD-10-CM

## 2016-10-31 DIAGNOSIS — S62309A Unspecified fracture of unspecified metacarpal bone, initial encounter for closed fracture: Secondary | ICD-10-CM

## 2016-10-31 LAB — URINALYSIS, ROUTINE W REFLEX MICROSCOPIC
BILIRUBIN URINE: NEGATIVE
Glucose, UA: NEGATIVE mg/dL
KETONES UR: NEGATIVE mg/dL
Leukocytes, UA: NEGATIVE
Nitrite: NEGATIVE
PH: 6 (ref 5.0–8.0)
Protein, ur: 100 mg/dL — AB
Specific Gravity, Urine: 1.023 (ref 1.005–1.030)

## 2016-10-31 LAB — CBC
HCT: 38.8 % — ABNORMAL LOW (ref 39.0–52.0)
Hemoglobin: 12.7 g/dL — ABNORMAL LOW (ref 13.0–17.0)
MCH: 35.8 pg — ABNORMAL HIGH (ref 26.0–34.0)
MCHC: 32.7 g/dL (ref 30.0–36.0)
MCV: 109.3 fL — AB (ref 78.0–100.0)
PLATELETS: 220 10*3/uL (ref 150–400)
RBC: 3.55 MIL/uL — AB (ref 4.22–5.81)
RDW: 15.2 % (ref 11.5–15.5)
WBC: 5 10*3/uL (ref 4.0–10.5)

## 2016-10-31 LAB — BASIC METABOLIC PANEL
Anion gap: 11 (ref 5–15)
BUN: 17 mg/dL (ref 6–20)
CALCIUM: 9 mg/dL (ref 8.9–10.3)
CO2: 23 mmol/L (ref 22–32)
CREATININE: 1.06 mg/dL (ref 0.61–1.24)
Chloride: 105 mmol/L (ref 101–111)
GFR calc Af Amer: >60 mL/min (ref >60)
GLUCOSE: 113 mg/dL — AB (ref 65–99)
Potassium: 4.1 mmol/L (ref 3.5–5.1)
SODIUM: 139 mmol/L (ref 135–145)

## 2016-10-31 LAB — CBG MONITORING, ED: GLUCOSE-CAPILLARY: 141 mg/dL — AB (ref 65–99)

## 2016-10-31 LAB — TROPONIN I

## 2016-10-31 MED ORDER — ASPIRIN 81 MG PO TABS: 81.0000 mg | ORAL_TABLET | Freq: Every day | ORAL | Status: DC

## 2016-10-31 MED ORDER — DONEPEZIL HCL 10 MG PO TABS
10.0000 mg | ORAL_TABLET | Freq: Two times a day (BID) | ORAL | Status: DC
  Administered 2016-10-31 – 2016-11-04 (×8): 10 mg via ORAL
  Filled 2016-10-31 (×8): qty 1

## 2016-10-31 MED ORDER — ENOXAPARIN SODIUM 40 MG/0.4ML ~~LOC~~ SOLN
40.0000 mg | SUBCUTANEOUS | Status: DC
  Administered 2016-10-31 – 2016-11-03 (×3): 40 mg via SUBCUTANEOUS
  Filled 2016-10-31 (×3): qty 0.4

## 2016-10-31 MED ORDER — ACETAMINOPHEN 325 MG PO TABS: 650.0000 mg | ORAL_TABLET | Freq: Four times a day (QID) | ORAL | Status: DC | PRN

## 2016-10-31 MED ORDER — ATORVASTATIN CALCIUM 10 MG PO TABS
10.0000 mg | ORAL_TABLET | Freq: Every day | ORAL | Status: DC
  Administered 2016-11-01 – 2016-11-04 (×4): 10 mg via ORAL
  Filled 2016-10-31 (×4): qty 1

## 2016-10-31 MED ORDER — TAMSULOSIN HCL 0.4 MG PO CAPS
0.4000 mg | ORAL_CAPSULE | Freq: Every day | ORAL | Status: DC
  Administered 2016-11-01 – 2016-11-03 (×3): 0.4 mg via ORAL
  Filled 2016-10-31 (×3): qty 1

## 2016-10-31 MED ORDER — VITAMIN D 1000 UNITS PO TABS
1000.0000 [IU] | ORAL_TABLET | Freq: Every day | ORAL | Status: DC
  Administered 2016-11-01 – 2016-11-04 (×4): 1000 [IU] via ORAL
  Filled 2016-10-31 (×4): qty 1

## 2016-10-31 MED ORDER — PAROXETINE HCL 20 MG PO TABS
10.0000 mg | ORAL_TABLET | Freq: Every day | ORAL | Status: DC
  Administered 2016-10-31 – 2016-11-03 (×4): 10 mg via ORAL
  Filled 2016-10-31 (×4): qty 1

## 2016-10-31 MED ORDER — CALCIUM CARBONATE 1500 (600 CA) MG PO TABS
1500.0000 mg | ORAL_TABLET | Freq: Every day | ORAL | Status: DC
  Filled 2016-10-31: qty 1

## 2016-10-31 MED ORDER — ONDANSETRON HCL 4 MG/2ML IJ SOLN: 4.0000 mg | Freq: Four times a day (QID) | INTRAMUSCULAR | Status: DC | PRN

## 2016-10-31 MED ORDER — ASPIRIN 81 MG PO CHEW
81.0000 mg | CHEWABLE_TABLET | Freq: Every day | ORAL | Status: DC
  Administered 2016-11-01 – 2016-11-04 (×4): 81 mg via ORAL
  Filled 2016-10-31 (×4): qty 1

## 2016-10-31 MED ORDER — VITAMIN D3 250 MCG (10000 UT) PO TABS: 1.0000 | ORAL_TABLET | Freq: Every day | ORAL | Status: DC

## 2016-10-31 MED ORDER — LEVOTHYROXINE SODIUM 50 MCG PO TABS
50.0000 ug | ORAL_TABLET | Freq: Every day | ORAL | Status: DC
  Administered 2016-11-01 – 2016-11-04 (×4): 50 ug via ORAL
  Filled 2016-10-31 (×5): qty 1

## 2016-10-31 MED ORDER — METHYLPHENIDATE HCL 5 MG PO TABS
10.0000 mg | ORAL_TABLET | Freq: Every day | ORAL | Status: DC
  Administered 2016-11-01 – 2016-11-04 (×4): 10 mg via ORAL
  Filled 2016-10-31 (×4): qty 2

## 2016-10-31 MED ORDER — SODIUM CHLORIDE 0.9% FLUSH
3.0000 mL | Freq: Two times a day (BID) | INTRAVENOUS | Status: DC
  Administered 2016-10-31 – 2016-11-04 (×7): 3 mL via INTRAVENOUS

## 2016-10-31 NOTE — H&P (Signed)
History and Physical    Spencer Howard WJX:[id number] DOB: 06-Aug-1928 DOA: 10/31/2016  PCP: Haywood Pao, MD  Patient coming from: Home  I have personally briefly reviewed patient's old medical records in [place]  Chief Complaint: Syncope  HPI: Spencer Howard is a 83 y.o. male with medical history significant of dementia (Alzheimer's vs EtOH related dementia), HTN.  Patient presents to ED with L hand injury.  Patient apparently was upset this morning, was trying to throw something, lost balance causing fall, injured left hand with fall.  Had second fall associated with syncopal event during visit to doctors office to get X-Ray of hand later in day today.   ED Course: Ortho saw patient and hand in splint.  Patient has no SFSR criteria but EDP uncomfortable sending patient home so requesting observation.   Review of Systems: As per HPI otherwise 10 point review of systems negative.   Past Medical History:  Diagnosis Date  . Alcohol abuse with alcohol-induced mental disorder ([place])   . Dementia associated with alcoholism ([place])    Alzheimer's dementia versus alcohol related dementia. 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 Depression   . High cholesterol   . Hypertension   . Hypothyroidism (acquired)     Past Surgical History:  Procedure Laterality Date  . ELBOW SURGERY Left 1995   Broken  . NM MYOVIEW LTD  02/2016   Normal LV function - EF 60-65%. No EKG changes. No ischemia or infarction noted on imaging. LOW RISK  . TONSILLECTOMY  1942  . TRANSTHORACIC ECHOCARDIOGRAM  02/2016   Moderate concentric LVH with EF 55-60%. GR 1 DD. Mild LA dilation. Mildly increased PA pressures  . VARICOCELECTOMY  1967     reports that he quit smoking about 47 years ago. He has never used smokeless tobacco. He reports that he drinks alcohol. He reports that he does not use drugs.  No Known Allergies  Family History  Problem Relation Age of Onset  .  Leukemia Father   . Colon cancer Mother   . Dementia Neg Hx      Prior to Admission medications   Medication Sig Start Date End Date Taking? Authorizing Provider  alendronate (FOSAMAX) 70 MG tablet Take 70 mg by mouth once a week.  05/18/15  Yes [provider]  aspirin 81 MG tablet Take 81 mg by mouth daily.   Yes [provider]  atorvastatin (LIPITOR) 10 MG tablet Take 10 mg by mouth daily.   Yes [provider]  calcium carbonate (OSCAL) 1500 (600 CA) MG TABS tablet Take 1,500 mg by mouth daily.   Yes [provider]  Cholecalciferol (VITAMIN D3) 10000 UNITS TABS Take 1 tablet by mouth daily.   Yes [provider]  donepezil (ARICEPT) 10 MG tablet Take 1 tablet (10 mg total) by mouth 2 (two) times daily. 05/16/16  Yes Melvenia Beam, MD  levothyroxine (SYNTHROID, LEVOTHROID) 50 MCG tablet Take 50 mcg by mouth daily before breakfast.   Yes [provider]  methylphenidate (RITALIN) 10 MG tablet Take 10 mg by mouth every morning. 10/18/16  Yes [provider]  Multiple Vitamins-Minerals (MULTIVITAMIN ADULT PO) Take 1 tablet by mouth daily.   Yes [provider]  PARoxetine (PAXIL) 10 MG tablet Take 10 mg by mouth at bedtime. 10/18/16  Yes [provider]  tamsulosin (FLOMAX) 0.4 MG CAPS capsule Take 0.4 mg by mouth daily.   Yes [provider]  Physical Exam: Vitals:   10/31/16 1915 10/31/16 1930 10/31/16 1945 10/31/16 2000  BP: (!) 159/94 (!) 167/66 (!) 153/50 (!) 145/57  Pulse: 92 70 67 67  Resp: (!) 26 20 19 19   Temp:      TempSrc:      SpO2: 96% 98% 98% 97%    Constitutional: NAD, calm, comfortable Eyes: PERRL, lids and conjunctivae normal ENMT: Mucous membranes are moist. Posterior pharynx clear of any exudate or lesions.Normal dentition.  Neck: normal, supple, no masses, no thyromegaly Respiratory: clear to auscultation bilaterally, no wheezing, no crackles. Normal respiratory effort.  No accessory muscle use.  Cardiovascular: Regular rate and rhythm, no murmurs / rubs / gallops. No extremity edema. 2+ pedal pulses. No carotid bruits.  Abdomen: no tenderness, no masses palpated. No hepatosplenomegaly. Bowel sounds positive.  Musculoskeletal: L hand in splint. Skin: no rashes, lesions, ulcers. No induration Neurologic: CN 2-12 grossly intact. Sensation intact, DTR normal. Strength 5/5 in all 4.  Psychiatric: Normal judgment and insight. Pleasantly confused with some obvious short term memory loss.   Labs on Admission: I have personally reviewed following labs and imaging studies  CBC:  Recent Labs Lab 10/31/16 1327  WBC 5.0  HGB 12.7*  HCT 38.8*  MCV 109.3*  PLT 706   Basic Metabolic Panel:  Recent Labs Lab 10/31/16 1523  NA 139  K 4.1  CL 105  CO2 23  GLUCOSE 113*  BUN 17  CREATININE 1.06  CALCIUM 9.0   GFR: CrCl cannot be calculated (Unknown ideal weight.). Liver Function Tests: No results for input(s): AST, ALT, ALKPHOS, BILITOT, PROT, ALBUMIN in the last 168 hours. No results for input(s): LIPASE, AMYLASE in the last 168 hours. No results for input(s): AMMONIA in the last 168 hours. Coagulation Profile: No results for input(s): INR, PROTIME in the last 168 hours. Cardiac Enzymes:  Recent Labs Lab 10/31/16 1523  TROPONINI <0.03   BNP (last 3 results) No results for input(s): PROBNP in the last 8760 hours. HbA1C: No results for input(s): HGBA1C in the last 72 hours. CBG:  Recent Labs Lab 10/31/16 1332  GLUCAP 141*   Lipid Profile: No results for input(s): CHOL, HDL, LDLCALC, TRIG, CHOLHDL, LDLDIRECT in the last 72 hours. Thyroid Function Tests: No results for input(s): TSH, T4TOTAL, FREET4, T3FREE, THYROIDAB in the last 72 hours. Anemia Panel: No results for input(s): VITAMINB12, FOLATE, FERRITIN, TIBC, IRON, RETICCTPCT in the last 72 hours. Urine analysis: No results found for: COLORURINE, APPEARANCEUR, [place], [place],  GLUCOSEU, HGBUR, BILIRUBINUR, KETONESUR, PROTEINUR, UROBILINOGEN, NITRITE, LEUKOCYTESUR  Radiological Exams on Admission: Dg Ribs Unilateral W/chest Left  Result Date: 10/31/2016 CLINICAL DATA:  Fall, rib pain, initial encounter. EXAM: LEFT RIBS AND CHEST - 3+ VIEW COMPARISON:  None. FINDINGS: Trachea is midline. Heart size normal. Thoracic aorta is calcified. Lungs are clear. No pleural fluid. No pneumothorax. Dedicated views of the left ribs show no acute fracture. IMPRESSION: No acute findings. Electronically Signed   By: Lorin Picket M.D.   On: 10/31/2016 15:16   Ct Head Wo Contrast  Result Date: 10/31/2016 CLINICAL DATA:  82 year old male post fall/syncopal episode. Denies loss of consciousness or hitting head. Initial encounter. EXAM: CT HEAD WITHOUT CONTRAST TECHNIQUE: Contiguous axial images were obtained from the base of the skull through the vertex without intravenous contrast. COMPARISON:  None. FINDINGS: Brain: No intracranial hemorrhage or CT evidence of large acute infarct. Moderate chronic microvascular changes. Moderate global atrophy. Ventricular prominence felt to be related to atrophy rather than hydrocephalus. No intracranial  mass lesion noted on this unenhanced exam. Vascular: Vascular calcifications. Skull: No skull fracture. Sinuses/Orbits: No acute orbital abnormality. Visualized sinuses are clear. Other: Mastoid air cells and middle ear cavities are clear. IMPRESSION: No skull fracture or intracranial hemorrhage. No CT evidence of large acute infarct. Moderate chronic microvascular changes. Moderate global atrophy. Vascular calcifications. Electronically Signed   By: Genia Del M.D.   On: 10/31/2016 15:30   Dg Hand Complete Left  Result Date: 10/31/2016 CLINICAL DATA:  83 year old male status post fall at home with pain of the fourth and fifth metacarpals EXAM: LEFT HAND - COMPLETE 3+ VIEW COMPARISON:  Concurrently obtained radiographs of the chest and left ribs FINDINGS:  Acute comminuted an mildly displaced fractures of the fourth can fifth metacarpals. No significant angulation. The remaining bones and joints demonstrate no evidence of acute injury. 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 IMPRESSION: 1. Comminuted and mildly displaced fractures through the fourth and fifth metacarpals without significant angulation. 2. Thumb interphalangeal joint osteoarthritis. Electronically Signed   By: Jacqulynn Cadet M.D.   On: 10/31/2016 15:16    EKG: Independently reviewed.  Assessment/Plan Principal Problem:   Syncope Active Problems:   MCI (mild cognitive impairment)   Essential hypertension   HLD (hyperlipidemia)   Hypothyroidism    1. Syncope - 1. Syncope pathway 2. Tele monitor 3. havent ordered repeat echo since he just had one in Dec last year 4. And actually patient clears based on SFSR 5. Will observe overnight at request of EDP 2. MCI - continue home meds 3. HTN - appears to be diet control only, monitor while here 4. HLD - continue statin 5. Hypothyroid - continue synthroid  DVT prophylaxis: Lovenox Code Status: Full Family Communication: No family in room Disposition Plan: Home after admit Consults called: Ortho note in chart Admission status: Place in obs   Swayze Kozuch M. DO [facility] Pager [phone]  If 7AM-7PM, please contact day team taking care of patient [email or web address] Password [facility]  10/31/2016, 8:37 PM

## 2016-10-31 NOTE — ED Provider Notes (Signed)
4:18 PM  Care assumed from Dr. Tamera Punt   At time of transfer care, patient is awaiting results of diagnostic laboratory testing prior to calling for admission. According to previous team, patient had a fall at home causing fractures of the left hand. Orthopedics recommended ulnar gutter splint which will be placed and they will follow-up with the patient.   While patient was going to PCPs office to get his hand examined, patient had 2 syncopal episodes are concerning. Patient was also bradycardic in the emergency.   Due to the multiple syncopal episodes, previous team felt patient needed admission. Initial CBC reassuring. CT head showed no acute fracture or abnormalities. Awaiting laboratory testing prior to admission.   Diagnostic laboratory testing reassuring.  Based on syncopal episode, patient's age, and description of symptoms, patient admitted for further syncopal workup.  Patient admitted in stable condition.   Clinical Impression: 1. Syncope and collapse   2. Rib contusion, left, initial encounter   3. Multiple closed fractures of single metacarpal bone, initial encounter     Disposition: Admit to Hospitalist service    Spencer Howard, Gwenyth Allegra, MD 11/01/16 [phone]

## 2016-10-31 NOTE — ED Provider Notes (Signed)
[facility] DEPT Provider Note   CSN: [id number] Arrival date & time: 10/31/16  1242     History   Chief Complaint Chief Complaint  Patient presents with  . Loss of Consciousness    HPI Spencer Howard is a 83 y.o. male.  Patient is a 83 year old male with a history of hypertension, hyperlipidemia and dementia who presents with syncopal episode. His wife states that this morning he got upset and was trying to throw something and lost his balance causing him to fall. He injured his left hand when he fell. She states that he might have hit his head but she doesn't really think that he has significant head injury. There is no loss of consciousness at that time. He did have some tremors following the event which she has had in the past. He then went to his doctor's office to get x-ray of his left hand and at that visit she had a syncopal episode where he fell to the floor and started having a tremor evident again. She states that he's never passed out before with these episodes. He denies any chest pain or shortness of breath. No palpitations. No dizziness. No other recent illnesses. He denies any other injuries from the earlier fall other than pain to his left hand.      Past Medical History:  Diagnosis Date  . Alcohol abuse with alcohol-induced mental disorder ([place])   . Dementia associated with alcoholism ([place])    Alzheimer's dementia versus alcohol related dementia. 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 Depression   . High cholesterol   . Hypertension   . Hypothyroidism (acquired)     Patient Active Problem List   Diagnosis Date Noted  . DOE (dyspnea on exertion) 03/05/2016  . Abnormal fourth heart sound (S4) 03/05/2016  . Atypical chest pain 03/05/2016  . MCI (mild cognitive impairment) 02/01/2015  . Essential hypertension 02/01/2015  . HLD (hyperlipidemia) 02/01/2015  . Depressed 02/01/2015  . Hypothyroidism 02/01/2015    Past Surgical  History:  Procedure Laterality Date  . ELBOW SURGERY Left 1995   Broken  . NM MYOVIEW LTD  02/2016   Normal LV function - EF 60-65%. No EKG changes. No ischemia or infarction noted on imaging. LOW RISK  . TONSILLECTOMY  1942  . TRANSTHORACIC ECHOCARDIOGRAM  02/2016   Moderate concentric LVH with EF 55-60%. GR 1 DD. Mild LA dilation. Mildly increased PA pressures  . VARICOCELECTOMY  1967       Home Medications    Prior to Admission medications   Medication Sig Start Date End Date Taking? Authorizing Provider  alendronate (FOSAMAX) 70 MG tablet Take 70 mg by mouth once a week.  05/18/15  Yes [provider]  aspirin 81 MG tablet Take 81 mg by mouth daily.   Yes [provider]  atorvastatin (LIPITOR) 10 MG tablet Take 10 mg by mouth daily.   Yes [provider]  calcium carbonate (OSCAL) 1500 (600 CA) MG TABS tablet Take 1,500 mg by mouth daily.   Yes [provider]  Cholecalciferol (VITAMIN D3) 10000 UNITS TABS Take 1 tablet by mouth daily.   Yes [provider]  donepezil (ARICEPT) 10 MG tablet Take 1 tablet (10 mg total) by mouth 2 (two) times daily. 05/16/16  Yes Melvenia Beam, MD  levothyroxine (SYNTHROID, LEVOTHROID) 50 MCG tablet Take 50 mcg by mouth daily before breakfast.   Yes [provider]  methylphenidate (RITALIN) 10 MG tablet Take 10  mg by mouth every morning. 10/18/16  Yes [provider]  Multiple Vitamins-Minerals (MULTIVITAMIN ADULT PO) Take 1 tablet by mouth daily.   Yes [provider]  PARoxetine (PAXIL) 10 MG tablet Take 10 mg by mouth at bedtime. 10/18/16  Yes [provider]  tamsulosin (FLOMAX) 0.4 MG CAPS capsule Take 0.4 mg by mouth daily.   Yes [provider]  escitalopram (LEXAPRO) 20 MG tablet Take 1 tablet (20 mg total) by mouth daily. Patient not taking: Reported on 10/31/2016 05/16/16   Melvenia Beam, MD    Family History Family History  Problem Relation Age  of Onset  . Leukemia Father   . Colon cancer Mother   . Dementia Neg Hx     Social History Social History  Substance Use Topics  . Smoking status: Former Smoker    Quit date: 03/28/1969  . Smokeless tobacco: Never Used  . Alcohol use 0.0 oz/week     Comment: 4-6 oz wine     Allergies   Patient has no known allergies.   Review of Systems Review of Systems  Constitutional: Negative for chills, diaphoresis, fatigue and fever.  HENT: Negative for congestion, rhinorrhea and sneezing.   Eyes: Negative.   Respiratory: Negative for cough, chest tightness and shortness of breath.   Cardiovascular: Negative for chest pain and leg swelling.  Gastrointestinal: Negative for abdominal pain, blood in stool, diarrhea, nausea and vomiting.  Genitourinary: Negative for difficulty urinating, flank pain, frequency and hematuria.  Musculoskeletal: Positive for arthralgias. Negative for back pain.  Skin: Negative for rash.  Neurological: Positive for syncope. Negative for dizziness, speech difficulty, weakness, numbness and headaches.     Physical Exam Updated Vital Signs BP (!) 151/62   Pulse 62   Temp 97.7 F (36.5 C) (Oral)   Resp (!) 21   SpO2 100%   Physical Exam  Constitutional: He is oriented to person, place, and time. He appears well-developed and well-nourished.  HENT:  Head: Normocephalic and atraumatic.  Nose: Nose normal.  Eyes: Pupils are equal, round, and reactive to light. Conjunctivae are normal.  Neck:  No pain to the cervical, thoracic, or LS spine.  No step-offs or deformities noted  Cardiovascular: Normal rate and regular rhythm.   No murmur heard. No evidence of external trauma to the chest or abdomen  Pulmonary/Chest: Effort normal and breath sounds normal. No respiratory distress. He has no wheezes. He exhibits tenderness.  Mild tenderness to left ribs  Abdominal: Soft. Bowel sounds are normal. He exhibits no distension. There is no tenderness.    Musculoskeletal: Normal range of motion.  Mild pain on palpation of the left hand diffusely with some ecchymosis to the dorsum of the hand. No pain to the wrist or elbow. He has normal sensation and motor function in the hand. Radial pulses are intact. No pain on palpation or ROM of the other extremities  Neurological: He is alert and oriented to person, place, and time.  Motor 5 out of 5 all extremities, sensation grossly intact to light touch all extremities  Skin: Skin is warm and dry. Capillary refill takes less than 2 seconds.  Psychiatric: He has a normal mood and affect.  Vitals reviewed.    ED Treatments / Results  Labs (all labs ordered are listed, but only abnormal results are displayed) Labs Reviewed  CBC - Abnormal; Notable for the following:       Result Value   RBC 3.55 (*)    Hemoglobin 12.7 (*)  HCT 38.8 (*)    MCV 109.3 (*)    MCH 35.8 (*)    All other components within normal limits  CBG MONITORING, ED - Abnormal; Notable for the following:    Glucose-Capillary 141 (*)    All other components within normal limits  URINALYSIS, ROUTINE W REFLEX MICROSCOPIC  TROPONIN I  BASIC METABOLIC PANEL    EKG  EKG Interpretation  Date/Time:  Monday October 31 2016 12:52:12 EDT Ventricular Rate:  61 PR Interval:    QRS Duration: 98 QT Interval:  439 QTC Calculation: 443 R Axis:   -35 Text Interpretation:  Sinus bradycardia Left axis deviation No old tracing to compare Confirmed by Malvin Johns [phone]) on 10/31/2016 4:02:54 PM       Radiology Dg Ribs Unilateral W/chest Left  Result Date: 10/31/2016 CLINICAL DATA:  Fall, rib pain, initial encounter. EXAM: LEFT RIBS AND CHEST - 3+ VIEW COMPARISON:  None. FINDINGS: Trachea is midline. Heart size normal. Thoracic aorta is calcified. Lungs are clear. No pleural fluid. No pneumothorax. Dedicated views of the left ribs show no acute fracture. IMPRESSION: No acute findings. Electronically Signed   By: Lorin Picket M.D.    On: 10/31/2016 15:16   Ct Head Wo Contrast  Result Date: 10/31/2016 CLINICAL DATA:  83 year old male post fall/syncopal episode. Denies loss of consciousness or hitting head. Initial encounter. EXAM: CT HEAD WITHOUT CONTRAST TECHNIQUE: Contiguous axial images were obtained from the base of the skull through the vertex without intravenous contrast. COMPARISON:  None. FINDINGS: Brain: No intracranial hemorrhage or CT evidence of large acute infarct. Moderate chronic microvascular changes. Moderate global atrophy. Ventricular prominence felt to be related to atrophy rather than hydrocephalus. No intracranial mass lesion noted on this unenhanced exam. Vascular: Vascular calcifications. Skull: No skull fracture. Sinuses/Orbits: No acute orbital abnormality. Visualized sinuses are clear. Other: Mastoid air cells and middle ear cavities are clear. IMPRESSION: No skull fracture or intracranial hemorrhage. No CT evidence of large acute infarct. Moderate chronic microvascular changes. Moderate global atrophy. Vascular calcifications. Electronically Signed   By: Genia Del M.D.   On: 10/31/2016 15:30   Dg Hand Complete Left  Result Date: 10/31/2016 CLINICAL DATA:  83 year old male status post fall at home with pain of the fourth and fifth metacarpals EXAM: LEFT HAND - COMPLETE 3+ VIEW COMPARISON:  Concurrently obtained radiographs of the chest and left ribs FINDINGS: Acute comminuted an mildly displaced fractures of the fourth can fifth metacarpals. No significant angulation. The remaining bones and joints demonstrate no evidence of acute injury. 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 IMPRESSION: 1. Comminuted and mildly displaced fractures through the fourth and fifth metacarpals without significant angulation. 2. Thumb interphalangeal joint osteoarthritis. Electronically Signed   By: Jacqulynn Cadet M.D.   On: 10/31/2016 15:16    Procedures Procedures (including critical care  time)  Medications Ordered in ED Medications - No data to display   Initial Impression / Assessment and Plan / ED Course  I have reviewed the triage vital signs and the nursing notes.  Pertinent labs & imaging results that were available during my care of the patient were reviewed by me and considered in my medical decision making (see chart for details).     Patient is 83 year old male who presents with 2 episodes of falling with twitching episodes. His second episode involved syncope. He does have hand fractures related to the first fall. I spoke with Dr. Grandville Silos with hand surgery. He will be placed in an ulnar  gutter splint and Dr. Grandville Silos will arrange for him follow-up. I feel that he is going to need to be admitted for observation given the second unprovoked syncopal episode. His labs are still pending.  Dr. Sherry Ruffing to take over care.  Final Clinical Impressions(s) / ED Diagnoses   Final diagnoses:  Syncope and collapse  Rib contusion, left, initial encounter  Multiple closed fractures of single metacarpal bone, initial encounter    New Prescriptions New Prescriptions   No medications on file     Malvin Johns, MD 10/31/16 1622

## 2016-10-31 NOTE — ED Triage Notes (Signed)
Pt arrives via [facility] from PCP's office.  Pt was having an xray to his left hand due to a fall from earlier this am.  Pt's wife witnessed his fall and denies LOC or hitting his head. Wife states pt was having tremors and seizure like activity while on the floor.  While at PCP's office pt was walking back to his room and had a syncopal episode.  Staff lowered pt to floor and stated he was pulseless for a brief period and quickly regained pulses.  No CPR was administered.  Pt is A/O x3.  PTA vitals: BP 128/60, HR 60, RR 16, 100% SPO2 on RA, 126 CBG.  Wife is at bedside and is a reliable historian.

## 2016-10-31 NOTE — Progress Notes (Signed)
Orthopedic Tech Progress Note Patient Details:  Spencer Howard Aug 16, 1928 [id number]  Ortho Devices Type of Ortho Device: Ace wrap, Ulna gutter splint Ortho Device/Splint Interventions: Application   [name] 10/31/2016, 5:01 PM

## 2016-10-31 NOTE — Telephone Encounter (Signed)
Patient's wife is calling. This morning after getting up patient got in recliner with no clothes on. She told him he needed to get his dependz on anyway but he threw something and fell flat on his face and hand. He is disoriented, confused and weak. He hurt his hand from falling and has xray today. She thinks patient needs a soon appointment with Dr,.Jaynee Eagles.

## 2016-10-31 NOTE — ED Notes (Signed)
Unsuccessful blood draw on pt. Nurse was notified.

## 2016-10-31 NOTE — Consult Note (Signed)
ORTHOPAEDIC CONSULTATION HISTORY & PHYSICAL REQUESTING PHYSICIAN: Malvin Johns, MD  Chief Complaint: Left hand injury  HPI: Spencer Howard is a 83 y.o. male who sustained a mechanical fall, sustaining close left hand fractures.  It may have been some type of syncopal event.  He is being admitted for further workup, but was noted also to have close left hand metacarpal fractures  Past Medical History:  Diagnosis Date  . Alcohol abuse with alcohol-induced mental disorder ([place])   . Dementia associated with alcoholism ([place])    Alzheimer's dementia versus alcohol related dementia. 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 Depression   . High cholesterol   . Hypertension   . Hypothyroidism (acquired)    Past Surgical History:  Procedure Laterality Date  . ELBOW SURGERY Left 1995   Broken  . NM MYOVIEW LTD  02/2016   Normal LV function - EF 60-65%. No EKG changes. No ischemia or infarction noted on imaging. LOW RISK  . TONSILLECTOMY  1942  . TRANSTHORACIC ECHOCARDIOGRAM  02/2016   Moderate concentric LVH with EF 55-60%. GR 1 DD. Mild LA dilation. Mildly increased PA pressures  . VARICOCELECTOMY  1967   Social History   Social History  . Marital status: Married    Spouse name: susan  . Number of children: 2  . Years of education: 52   Occupational History  . Retired     Retired Transport planner. 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 Smoking status: Former Smoker    Quit date: 03/28/1969  . Smokeless tobacco: Never Used  . Alcohol use 0.0 oz/week     Comment: 4-6 oz wine  . Drug use: No  . Sexual activity: Not Asked   Other Topics Concern  . None   Social History Narrative   Married father of 2, grandfather of 28, great grandfather of 58. He is married for 50 years.   Lives at home with wife.    Caffeine use: 1-2 cups per day   Drinks maybe 25-30 glasses of wine a week.   No routine exercise.   He grew up in [place] near  [employer] right-handed, throws left-handed   Family History  Problem Relation Age of Onset  . Leukemia Father   . Colon cancer Mother   . Dementia Neg Hx    No Known Allergies Prior to Admission medications   Medication Sig Start Date End Date Taking? Authorizing Provider  alendronate (FOSAMAX) 70 MG tablet Take 70 mg by mouth once a week.  05/18/15  Yes [provider]  aspirin 81 MG tablet Take 81 mg by mouth daily.   Yes [provider]  atorvastatin (LIPITOR) 10 MG tablet Take 10 mg by mouth daily.   Yes [provider]  calcium carbonate (OSCAL) 1500 (600 CA) MG TABS tablet Take 1,500 mg by mouth daily.   Yes [provider]  Cholecalciferol (VITAMIN D3) 10000 UNITS TABS Take 1 tablet by mouth daily.   Yes [provider]  donepezil (ARICEPT) 10 MG tablet Take 1 tablet (10 mg total) by mouth 2 (two) times daily. 05/16/16  Yes Melvenia Beam, MD  levothyroxine (SYNTHROID, LEVOTHROID) 50 MCG tablet Take 50 mcg by mouth daily before breakfast.   Yes [provider]  methylphenidate (RITALIN) 10 MG tablet Take 10 mg by mouth every morning. 10/18/16  Yes [provider]  Multiple Vitamins-Minerals (MULTIVITAMIN ADULT PO) Take 1 tablet  by mouth daily.   Yes [provider]  PARoxetine (PAXIL) 10 MG tablet Take 10 mg by mouth at bedtime. 10/18/16  Yes [provider]  tamsulosin (FLOMAX) 0.4 MG CAPS capsule Take 0.4 mg by mouth daily.   Yes [provider]  escitalopram (LEXAPRO) 20 MG tablet Take 1 tablet (20 mg total) by mouth daily. Patient not taking: Reported on 10/31/2016 05/16/16   Melvenia Beam, MD   Dg Ribs Unilateral W/chest Left  Result Date: 10/31/2016 CLINICAL DATA:  Fall, rib pain, initial encounter. EXAM: LEFT RIBS AND CHEST - 3+ VIEW COMPARISON:  None. FINDINGS: Trachea is midline. Heart size normal. Thoracic aorta is calcified. Lungs are clear. No pleural fluid. No  pneumothorax. Dedicated views of the left ribs show no acute fracture. IMPRESSION: No acute findings. Electronically Signed   By: Lorin Picket M.D.   On: 10/31/2016 15:16   Ct Head Wo Contrast  Result Date: 10/31/2016 CLINICAL DATA:  83 year old male post fall/syncopal episode. Denies loss of consciousness or hitting head. Initial encounter. EXAM: CT HEAD WITHOUT CONTRAST TECHNIQUE: Contiguous axial images were obtained from the base of the skull through the vertex without intravenous contrast. COMPARISON:  None. FINDINGS: Brain: No intracranial hemorrhage or CT evidence of large acute infarct. Moderate chronic microvascular changes. Moderate global atrophy. Ventricular prominence felt to be related to atrophy rather than hydrocephalus. No intracranial mass lesion noted on this unenhanced exam. Vascular: Vascular calcifications. Skull: No skull fracture. Sinuses/Orbits: No acute orbital abnormality. Visualized sinuses are clear. Other: Mastoid air cells and middle ear cavities are clear. IMPRESSION: No skull fracture or intracranial hemorrhage. No CT evidence of large acute infarct. Moderate chronic microvascular changes. Moderate global atrophy. Vascular calcifications. Electronically Signed   By: Genia Del M.D.   On: 10/31/2016 15:30   Dg Hand Complete Left  Result Date: 10/31/2016 CLINICAL DATA:  83 year old male status post fall at home with pain of the fourth and fifth metacarpals EXAM: LEFT HAND - COMPLETE 3+ VIEW COMPARISON:  Concurrently obtained radiographs of the chest and left ribs FINDINGS: Acute comminuted an mildly displaced fractures of the fourth can fifth metacarpals. No significant angulation. The remaining bones and joints demonstrate no evidence of acute injury. 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 IMPRESSION: 1. Comminuted and mildly displaced fractures through the fourth and fifth metacarpals without significant angulation. 2. Thumb interphalangeal  joint osteoarthritis. Electronically Signed   By: Jacqulynn Cadet M.D.   On: 10/31/2016 15:16    Positive ROS: All other systems have been reviewed and were otherwise negative with the exception of those mentioned in the HPI and as above.  Physical Exam: Vitals: Refer to EMR. Constitutional:  WD, WN, NAD HEENT:  NCAT, EOMI Neuro/Psych:  Alert & oriented to person, place, and time; appropriate mood & affect Lymphatic: No generalized extremity edema or lymphadenopathy Extremities / MSK:  The extremities are normal with respect to appearance, ranges of motion, joint stability, muscle strength/tone, sensation, & perfusion except as otherwise noted:  Left hand has normal gutter splint applied.  No significant malrotation noted to the ring and small fingers.  Intact light touch sensation across all the digital tips.  Nailbeds pink, warm fingertips with brisk capillary refill.  Assessment: Mildly displaced left fourth and fifth extra-articular metacarpal fractures, without significant angulation  Plan: I discussed these findings with him, including a plan for continued closed treatment.  He is to keep his present splint on, keep it clean and dry, with follow-up in my  office in roughly 10-15 days.  My office will call his residence to arrange for follow-up.  Rayvon Char Grandville Silos, [place], [place]  [address] Office: [phone] Mobile: [phone]  10/31/2016, 5:33 PM

## 2016-10-31 NOTE — Telephone Encounter (Signed)
Returned call to pt's wife. She reports that pt is going to have xray of his hand today. Denies any s/s of infection, although he has been incontinent of urine for some time. Sooner appt scheduled for Thurs 11/03/16.

## 2016-11-01 ENCOUNTER — Encounter (HOSPITAL_COMMUNITY): Payer: Self-pay | Admitting: *Deleted

## 2016-11-01 DIAGNOSIS — E7800 Pure hypercholesterolemia, unspecified: Secondary | ICD-10-CM | POA: Diagnosis present

## 2016-11-01 DIAGNOSIS — I951 Orthostatic hypotension: Secondary | ICD-10-CM | POA: Diagnosis not present

## 2016-11-01 DIAGNOSIS — Y92009 Unspecified place in unspecified non-institutional (private) residence as the place of occurrence of the external cause: Secondary | ICD-10-CM | POA: Diagnosis not present

## 2016-11-01 DIAGNOSIS — S62305A Unspecified fracture of fourth metacarpal bone, left hand, initial encounter for closed fracture: Secondary | ICD-10-CM | POA: Diagnosis present

## 2016-11-01 DIAGNOSIS — E039 Hypothyroidism, unspecified: Secondary | ICD-10-CM | POA: Diagnosis present

## 2016-11-01 DIAGNOSIS — R55 Syncope and collapse: Secondary | ICD-10-CM | POA: Diagnosis not present

## 2016-11-01 DIAGNOSIS — W010XXA Fall on same level from slipping, tripping and stumbling without subsequent striking against object, initial encounter: Secondary | ICD-10-CM | POA: Diagnosis present

## 2016-11-01 DIAGNOSIS — S6292XD Unspecified fracture of left wrist and hand, subsequent encounter for fracture with routine healing: Secondary | ICD-10-CM | POA: Diagnosis not present

## 2016-11-01 DIAGNOSIS — F329 Major depressive disorder, single episode, unspecified: Secondary | ICD-10-CM | POA: Diagnosis present

## 2016-11-01 DIAGNOSIS — F0390 Unspecified dementia without behavioral disturbance: Secondary | ICD-10-CM | POA: Diagnosis present

## 2016-11-01 DIAGNOSIS — F482 Pseudobulbar affect: Secondary | ICD-10-CM | POA: Diagnosis present

## 2016-11-01 DIAGNOSIS — F1010 Alcohol abuse, uncomplicated: Secondary | ICD-10-CM | POA: Diagnosis present

## 2016-11-01 DIAGNOSIS — S62307A Unspecified fracture of fifth metacarpal bone, left hand, initial encounter for closed fracture: Secondary | ICD-10-CM | POA: Diagnosis present

## 2016-11-01 DIAGNOSIS — E038 Other specified hypothyroidism: Secondary | ICD-10-CM | POA: Diagnosis not present

## 2016-11-01 DIAGNOSIS — Z87891 Personal history of nicotine dependence: Secondary | ICD-10-CM | POA: Diagnosis not present

## 2016-11-01 DIAGNOSIS — Z7982 Long term (current) use of aspirin: Secondary | ICD-10-CM | POA: Diagnosis not present

## 2016-11-01 DIAGNOSIS — F0391 Unspecified dementia with behavioral disturbance: Secondary | ICD-10-CM | POA: Diagnosis not present

## 2016-11-01 DIAGNOSIS — I10 Essential (primary) hypertension: Secondary | ICD-10-CM | POA: Diagnosis not present

## 2016-11-01 DIAGNOSIS — Z79899 Other long term (current) drug therapy: Secondary | ICD-10-CM | POA: Diagnosis not present

## 2016-11-01 LAB — TSH: TSH: 4.804 u[IU]/mL — ABNORMAL HIGH (ref 0.350–4.500)

## 2016-11-01 LAB — GLUCOSE, CAPILLARY: GLUCOSE-CAPILLARY: 95 mg/dL (ref 65–99)

## 2016-11-01 MED ORDER — LORAZEPAM 2 MG/ML IJ SOLN
1.0000 mg | Freq: Once | INTRAMUSCULAR | Status: AC
  Administered 2016-11-01: 1 mg via INTRAVENOUS
  Filled 2016-11-01: qty 1

## 2016-11-01 MED ORDER — CALCIUM CARBONATE 1250 (500 CA) MG PO TABS
1250.0000 mg | ORAL_TABLET | Freq: Every day | ORAL | Status: DC
  Administered 2016-11-01 – 2016-11-04 (×4): 1250 mg via ORAL
  Filled 2016-11-01 (×4): qty 1

## 2016-11-01 MED ORDER — SODIUM CHLORIDE 0.9 % IV SOLN
INTRAVENOUS | Status: DC
  Administered 2016-11-02 – 2016-11-03 (×3): via INTRAVENOUS

## 2016-11-01 MED ORDER — SODIUM CHLORIDE 0.9 % IV BOLUS (SEPSIS)
250.0000 mL | Freq: Once | INTRAVENOUS | Status: AC
  Administered 2016-11-01: 250 mL via INTRAVENOUS

## 2016-11-01 NOTE — Evaluation (Signed)
Physical Therapy Evaluation Patient Details Name: Spencer Howard MRN: [id number] DOB: 1928-07-13 Today's Date: 11/01/2016   History of Present Illness  Patient is a 83 y/o male who presents with syncopal episode and fall. s/p left hand fx. PMH includes ETOH use, dementia assoicated with ETOH, HLD, HTN, hypothyroidism and hx of tobacco use.   Clinical Impression  Patient presents with impaired balance, impulsivity, poor safety awareness and impaired mobility s/p above. Tolerated gait training with and without RW for support. Requires Min A for balance/stability using RW and max cues for safety as pt getting tangled in lines. Pt with LOB into bed without using RW for within room ambulation. Education re: using rollator at home for support and importance of waiting before standing up if pt dizzy. Pt not a great historian. Lives with wife. Will follow acutely to maximize independence and mobility prior to return home. Recommend taking rollator for next session for instruction. HIGH FALL RISK.    Follow Up Recommendations Home health PT;Supervision for mobility/OOB;Supervision/Assistance - 24 hour    Equipment Recommendations  None recommended by PT    Recommendations for Other Services OT consult     Precautions / Restrictions Precautions Precautions: Fall Precaution Comments: watch BP- tech in room ready to take orthostatics. Restrictions Weight Bearing Restrictions: No Other Position/Activity Restrictions: Left hand wrapped in splint- no orders for WB restrictions/precautions in chart.      Mobility  Bed Mobility Overal bed mobility: Needs Assistance Bed Mobility: Supine to Sit     Supine to sit: Modified independent (Device/Increase time);HOB elevated     General bed mobility comments: use of rail to get to EOB. No dizziness reported.   Transfers Overall transfer level: Needs assistance Equipment used: Rolling walker (2 wheeled);None Transfers: Sit to/from Stand Sit to Stand:  Min guard         General transfer comment: Stood from EOB x2 impulsively, transferred to chair post ambulation. no dizziness in standing. Min guard for safety due to impulsivity.  Ambulation/Gait Ambulation/Gait assistance: Min assist Ambulation Distance (Feet): 150 Feet (+12') Assistive device: Rolling walker (2 wheeled);None Gait Pattern/deviations: Decreased stride length;Step-through pattern;Decreased step length - right;Decreased step length - left;Trunk flexed;Staggering right;Staggering left Gait velocity: unsafe speed   General Gait Details: Cues for RW proxmity; difficulty staying in RW during turns. Fast, unsafe speed, staggering right/left. In room, tried without RW, pt with LOB into bed, getting tangled in lines, poor safety awareness. HR up to 115 bpm. NO dizziness.  Stairs            Wheelchair Mobility    Modified Rankin (Stroke Patients Only)       Balance Overall balance assessment: Needs assistance;History of Falls Sitting-balance support: Feet supported;No upper extremity supported Sitting balance-Leahy Scale: Good Sitting balance - Comments: Able to reach outside BoS and donn socks without difficulty.    Standing balance support: During functional activity Standing balance-Leahy Scale: Fair Standing balance comment: Able to stand statically without UE support; requires UE support for ambulation/stability. no dizziness.                             Pertinent Vitals/Pain Pain Assessment: No/denies pain    Home Living Family/patient expects to be discharged to:: Private residence Living Arrangements: Spouse/significant other Available Help at Discharge: Family;Available 24 hours/day Type of Home: House Home Access: Stairs to enter Entrance Stairs-Rails: Right Entrance Stairs-Number of Steps: 4 Home Layout: Two level;Able to live on main level  with bedroom/bathroom Home Equipment: Shower seat;Walker - 4 wheels;Cane - single point       Prior Function Level of Independence: Independent with assistive device(s)         Comments: Pt just got a rollator yesterday and started using it. Furniture walks at baseline and rarely leaves house. Does not drive. 1 fall in last 6 months.     Hand Dominance   Dominant Hand: Left    Extremity/Trunk Assessment   Upper Extremity Assessment Upper Extremity Assessment: Defer to OT evaluation (L hand wrapped in splint. )    Lower Extremity Assessment Lower Extremity Assessment: Generalized weakness       Communication   Communication: No difficulties  Cognition Arousal/Alertness: Awake/alert Behavior During Therapy: Impulsive Overall Cognitive Status: History of cognitive impairments - at baseline                                 General Comments: Pt with poor safety awareness and poor awareness of deficits. Decreased memory per wife.       General Comments General comments (skin integrity, edema, etc.): Wife present during session. provided most of hx.    Exercises     Assessment/Plan    PT Assessment Patient needs continued PT services  PT Problem List Decreased strength;Decreased mobility;Decreased safety awareness;Decreased cognition;Decreased balance;Decreased knowledge of use of DME;Decreased activity tolerance;Cardiopulmonary status limiting activity       PT Treatment Interventions Therapeutic activities;Gait training;Therapeutic exercise;Patient/family education;Balance training;Functional mobility training;Stair training    PT Goals (Current goals can be found in the Care Plan section)  Acute Rehab PT Goals Patient Stated Goal: to play golf again PT Goal Formulation: With patient Time For Goal Achievement: 11/15/16 Potential to Achieve Goals: Fair    Frequency Min 3X/week   Barriers to discharge        Co-evaluation               AM-PAC PT "6 Clicks" Daily Activity  Outcome Measure Difficulty turning over in bed (including  adjusting bedclothes, sheets and blankets)?: None Difficulty moving from lying on back to sitting on the side of the bed? : None Difficulty sitting down on and standing up from a chair with arms (e.g., wheelchair, bedside commode, etc,.)?: None Help needed moving to and from a bed to chair (including a wheelchair)?: A Little Help needed walking in hospital room?: A Little Help needed climbing 3-5 steps with a railing? : A Lot 6 Click Score: 20    End of Session Equipment Utilized During Treatment: Gait belt Activity Tolerance: Patient tolerated treatment well Patient left: in chair;with call bell/phone within reach;with family/visitor present;Other (comment) (chair alarm pad udner pt but no box in room, tech notified. ) Nurse Communication: Mobility status;Other (comment) (there is no alarm box) PT Visit Diagnosis: Unsteadiness on feet (R26.81);Difficulty in walking, not elsewhere classified (R26.2);Muscle weakness (generalized) (M62.81)    Time: 8466-5993 PT Time Calculation (min) (ACUTE ONLY): 23 min   Charges:   PT Evaluation $PT Eval Moderate Complexity: 1 Mod PT Treatments $Gait Training: 8-22 mins   PT G Codes:   PT G-Codes **NOT FOR INPATIENT CLASS** Functional Limitation: Mobility: Walking and moving around Mobility: Walking and Moving Around Current Status (T7017): At least 20 percent but less than 40 percent impaired, limited or restricted Mobility: Walking and Moving Around Goal Status [phone]): At least 1 percent but less than 20 percent impaired, limited or restricted  Wray Kearns, PT, DPT [place] 11/01/2016, 3:55 PM

## 2016-11-01 NOTE — Consult Note (Signed)
Cardiology Consultation:   Patient ID: Spencer Howard; [id number]; Jun 08, 1928   Admit date: 10/31/2016 Date of Consult: 11/01/2016  Primary Care Provider: Haywood Pao, MD Primary Cardiologist: Spencer Howard Primary Electrophysiologist:  NA   Patient Profile:   Spencer Howard is a 83 y.o. male with a Howard of ETOH use, dementia assoicated with ETOH, HLD, HTN, hypothyroidism and Howard of tobacco use who is being seen today for the evaluation of syncope and orthostatic hypotension at the request of Spencer Howard.  History of Present Illness:   Spencer Howard of ETOH use, dementia assoicated with ETOH, HLD, HTN, hypothyroidism and Howard of tobacco use.  Also  In 12.2017 he had neg lexiscan myoview for ischemia, Echo at similar time was normal except for moderate concentric LVH with G1 DD.  He has a Howard of falls and has not been unable to answer questions in past.    He was admitted yesterday after syncope.  Originally he was trying to throw something at home, lost his balance and fell with injury to Lt hand.  He was at MD office to have Xray of hand and was walking back to room and passed out, ? Pulseless for brief period but quickly regained pulse.  No CPR.    In ER BP 136/57, P 59, R 15 afebrile.    He was orthostatic with drop in BP from 133/46 lying to 78/41 with standing.   Troponin <0.03, Hgb 12.7, K+ 4.1, Cr 1.06  CT head with no acute process, CXR no acute findings. Lt hand with fx.   EKG SR LAD no old to compare. EKG personally reviewed.  With nuc study baseline EKG sinus brady.   Tele:  Personally reviewed SR with freq PACs at times.  ST at times. outpt meds on flomax that may cause dizziness orthostatic BP   Currently he has no complaints no chest pain except where he fell yesterday.  No SOB.  He tells me he tries to drink 4 glasses of water per day, he does eat, and he drinks a small bottle of wine most days.  Unsure how much of what he tells me is true, he asks same questions about every 5  min.    Past Medical History:  Diagnosis Date  . Alcohol abuse with alcohol-induced mental disorder ([place])   . Dementia associated with alcoholism ([place])    Alzheimer's dementia versus alcohol related dementia. 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 Depression   . High cholesterol   . Hypertension   . Hypothyroidism (acquired)     Past Surgical History:  Procedure Laterality Date  . ELBOW SURGERY Left 1995   Broken  . NM MYOVIEW LTD  02/2016   Normal LV function - EF 60-65%. No EKG changes. No ischemia or infarction noted on imaging. LOW RISK  . TONSILLECTOMY  1942  . TRANSTHORACIC ECHOCARDIOGRAM  02/2016   Moderate concentric LVH with EF 55-60%. GR 1 DD. Mild LA dilation. Mildly increased PA pressures  . VARICOCELECTOMY  1967     Inpatient Medications: Scheduled Meds: . aspirin  81 mg Oral Daily  . atorvastatin  10 mg Oral Daily  . calcium carbonate  1,250 mg Oral Q breakfast  . cholecalciferol  1,000 Units Oral Daily  . donepezil  10 mg Oral BID  . enoxaparin (LOVENOX) injection  40 mg Subcutaneous Q24H  . levothyroxine  50 mcg Oral QAC breakfast  . methylphenidate  10 mg Oral Daily  .  PARoxetine  10 mg Oral QHS  . sodium chloride flush  3 mL Intravenous Q12H  . tamsulosin  0.4 mg Oral Daily   Continuous Infusions: . sodium chloride     PRN Meds: acetaminophen, ondansetron (ZOFRAN) IV  Allergies:   No Known Allergies  Social History:   Social History   Social History  . Marital status: Married    Spouse name: susan  . Number of children: 2  . Years of education: 68   Occupational History  . Retired     Retired Transport planner. [employer] and then worked for [employer]  . Smoking status: Former Smoker    Quit date: 03/28/1969  . Smokeless tobacco: Never Used  . Alcohol use 0.0 oz/week     Comment: 4-6 oz wine  . Drug use: No  . Sexual activity: Not on file   Other Topics Concern  . Not on file   Social  History Narrative   Married father of 2, grandfather of 56, great grandfather of 37. He is married for 50 years.   Lives at home with wife.    Caffeine use: 1-2 cups per day   Drinks maybe 25-30 glasses of wine a week.   No routine exercise.   He grew up in [place] near [employer] right-handed, throws left-handed    Family History:   Family History  Problem Relation Age of Onset  . Leukemia Father   . Colon cancer Mother   . Dementia Neg Howard      ROS:  Please see the history of present illness.  ROS  General:no colds or fevers, no weight changes Skin:no rashes or ulcers HEENT:no blurred vision, no congestion CV:see HPI PUL:see HPI GI:no diarrhea constipation or melena, no indigestion GU:no hematuria, no dysuria MS:+ rib pain and Lt hand pain from fall., no claudication Neuro:no syncope, no lightheadedness Endo:no diabetes, no thyroid disease    Physical Exam/Data:   Vitals:   11/01/16 0040 11/01/16 0043 11/01/16 0454 11/01/16 0818  BP: 132/62 (!) 122/96 (!) 153/75 (!) 163/64  Pulse: 82 97 67   Resp:   18   Temp:   97.7 F (36.5 C)   TempSrc:   Oral   SpO2: 99% 95% 98%   Weight:   196 lb (88.9 kg)   Height:        Intake/Output Summary (Last 24 hours) at 11/01/16 1417 Last data filed at 10/31/16 2128  Gross per 24 hour  Intake                0 ml  Output              150 ml  Net             -150 ml   Filed Weights   11/01/16 0038 11/01/16 0454  Weight: 195 lb 1.7 oz (88.5 kg) 196 lb (88.9 kg)   Body mass index is 27.34 kg/m.  General:  Well nourished, well developed, in no acute distress HEENT: normal Lymph: no adenopathy Neck: no JVD Endocrine:  No thryomegaly Vascular: No carotid bruits; 2+ pedal pulses bil.  Cardiac:  normal S1, S2; RRR; no murmur, gallup rub or click   Lungs:  clear to auscultation bilaterally, no wheezing, rhonchi or rales  Abd: soft, nontender, no hepatomegaly  Ext: no to tr edema Musculoskeletal:  No deformities,  BUE and BLE strength normal and equal- Lt hand wrapped with recent fx.  Hand  is swollen Skin: warm and dry  Neuro:  Oriented to person and place, forgets very easily  Psych:  Normal affect    Relevant CV Studies: Nuc study   5\' 10"  (1.778 m) 197 lb 3.2 oz (89.4 kg) 28.4  Study Highlights   Addendum by Skeet Latch, MD on Fri Mar 11, 2016 5:45 PM   Nuclear stress EF: 63%.  The left ventricular ejection fraction is normal (55-65%).  There was no ST segment deviation noted during stress.  The study is normal.  This is a low risk study.    Finalized by Skeet Latch, MD on Fri Mar 11, 2016 5:41 PM   Nuclear stress EF: 63%.  The left ventricular ejection fraction is normal (55-65%).     Echo 03/25/16 Study Conclusions  - Left ventricle: There was moderate concentric hypertrophy.   Systolic function was normal. The estimated ejection fraction was   in the range of 55% to 60%. Wall motion was normal; there were no   regional wall motion abnormalities. Doppler parameters are   consistent with abnormal left ventricular relaxation (grade 1   diastolic dysfunction). Doppler parameters are consistent with   elevated ventricular end-diastolic filling pressure. - Aortic valve: There was trivial regurgitation. - Mitral valve: Calcified annulus. - Left atrium: The atrium was mildly dilated. - Right ventricle: The cavity size was mildly dilated. - Pulmonary arteries: Systolic pressure was mildly increased. PA   peak pressure: 36 mm Hg (S).  Laboratory Data:  Chemistry Recent Labs Lab 10/31/16 1523  NA 139  K 4.1  CL 105  CO2 23  GLUCOSE 113*  BUN 17  CREATININE 1.06  CALCIUM 9.0  GFRNONAA >60  GFRAA >60  ANIONGAP 11    No results for input(s): PROT, ALBUMIN, AST, ALT, ALKPHOS, BILITOT in the last 168 hours. Hematology Recent Labs Lab 10/31/16 1327  WBC 5.0  RBC 3.55*  HGB 12.7*  HCT 38.8*  MCV 109.3*  MCH 35.8*  MCHC 32.7  RDW 15.2  PLT 220     Cardiac Enzymes Recent Labs Lab 10/31/16 1523  TROPONINI <0.03   No results for input(s): TROPIPOC in the last 168 hours.  BNPNo results for input(s): BNP, PROBNP in the last 168 hours.  DDimer No results for input(s): DDIMER in the last 168 hours.  Radiology/Studies:  Dg Ribs Unilateral W/chest Left  Result Date: 10/31/2016 CLINICAL DATA:  Fall, rib pain, initial encounter. EXAM: LEFT RIBS AND CHEST - 3+ VIEW COMPARISON:  None. FINDINGS: Trachea is midline. Heart size normal. Thoracic aorta is calcified. Lungs are clear. No pleural fluid. No pneumothorax. Dedicated views of the left ribs show no acute fracture. IMPRESSION: No acute findings. Electronically Signed   By: Lorin Picket M.D.   On: 10/31/2016 15:16   Ct Head Wo Contrast  Result Date: 10/31/2016 CLINICAL DATA:  83 year old male post fall/syncopal episode. Denies loss of consciousness or hitting head. Initial encounter. EXAM: CT HEAD WITHOUT CONTRAST TECHNIQUE: Contiguous axial images were obtained from the base of the skull through the vertex without intravenous contrast. COMPARISON:  None. FINDINGS: Brain: No intracranial hemorrhage or CT evidence of large acute infarct. Moderate chronic microvascular changes. Moderate global atrophy. Ventricular prominence felt to be related to atrophy rather than hydrocephalus. No intracranial mass lesion noted on this unenhanced exam. Vascular: Vascular calcifications. Skull: No skull fracture. Sinuses/Orbits: No acute orbital abnormality. Visualized sinuses are clear. Other: Mastoid air cells and middle ear cavities are clear. IMPRESSION: No skull fracture or intracranial hemorrhage. No  CT evidence of large acute infarct. Moderate chronic microvascular changes. Moderate global atrophy. Vascular calcifications. Electronically Signed   By: Genia Del M.D.   On: 10/31/2016 15:30   Dg Hand Complete Left  Result Date: 10/31/2016 CLINICAL DATA:  83 year old male status post fall at home with pain  of the fourth and fifth metacarpals EXAM: LEFT HAND - COMPLETE 3+ VIEW COMPARISON:  Concurrently obtained radiographs of the chest and left ribs FINDINGS: Acute comminuted an mildly displaced fractures of the fourth can fifth metacarpals. No significant angulation. The remaining bones and joints demonstrate no evidence of acute injury. 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 IMPRESSION: 1. Comminuted and mildly displaced fractures through the fourth and fifth metacarpals without significant angulation. 2. Thumb interphalangeal joint osteoarthritis. Electronically Signed   By: Jacqulynn Cadet M.D.   On: 10/31/2016 15:16    Assessment and Plan:  Principal Problem:   Syncope Active Problems:   MCI (mild cognitive impairment)   Essential hypertension   HLD (hyperlipidemia)   Hypothyroidism    1. Syncope due to orthostatic hypotension, on no med except flomax to cause.  would have dietician talk with wife on diet and keeping hydrated.  Wear support stockings and abd binder if he could tolerate.  Dr. Oval Linsey to see - he may need florinef ?  Agree with fluids -   2.          Dementia not sure he will be able to wear support stockings.   3.            Previous Echo and nuc were normal.     Signed, Cecilie Kicks, NP  11/01/2016 2:17 PM

## 2016-11-01 NOTE — Progress Notes (Addendum)
PROGRESS NOTE    Spencer Howard  GDJ:[id number] DOB: 10-31-1928 DOA: 10/31/2016 PCP: Haywood Pao, MD   Brief Narrative: 83 year old male with history of dementia, hypertension presented after a fall and syncopal episode at PCPs office. He sustained left hand injury and fracture. Seen by orthopedics. Admitted for further evaluation of syncope.  Assessment & Plan:   # Syncopal episode likely in the setting of orthostatic hypotension: -During orthostatic blood pressure evaluation, his systolic blood pressure dropped to 50s understanding. Reported poor oral intake yesterday. Discussed with the patient and his wife at bedside. Patient denied any dizziness or lightheadedness however. Plan to start IV fluid bolus and maintenance fluid. He has no focal neurological deficit.  -He had cardiac nuclear stress test in December 2017 which was unremarkable, echo with EF of 55-60% with normal wall motion. Patient has no fluid overload. He does not have chest pain or shortness of breath. -EKG with sinus rhythm, troponin negative. -PT OT evaluation, continue IV fluid, monitor orthostatic blood pressure. -CT head with moderate global atrophy with no acute finding. -Cardiology consult requested.  #Left hand fracture seen by orthopedics:has ACE wrap and splint. Outpatient follow-up with orthopedics.  #Hypothyroidism: Check TSH. Continue Synthroid  #Hyperlipidemia: Continue statin  Principal Problem:   Syncope Active Problems:   MCI (mild cognitive impairment)   Essential hypertension   HLD (hyperlipidemia)   Hypothyroidism  DVT prophylaxis: Lovenox subcutaneous Code Status: Full code Family Communication: Discussed with the patient's wife at bedside Disposition Plan: Likely discharge home in 1-2 days.    Consultants:   Cardiologist  Procedures: None Antimicrobials: None  Subjective: Seen and examined at bedside. Patient denies headache, dizziness, nausea vomiting dysuria urgency or  frequency. No hand pain. No chest pain or shortness of breath.  Objective: Vitals:   11/01/16 0040 11/01/16 0043 11/01/16 0454 11/01/16 0818  BP: 132/62 (!) 122/96 (!) 153/75 (!) 163/64  Pulse: 82 97 67   Resp:   18   Temp:   97.7 F (36.5 C)   TempSrc:   Oral   SpO2: 99% 95% 98%   Weight:   88.9 kg (196 lb)   Height:        Intake/Output Summary (Last 24 hours) at 11/01/16 1227 Last data filed at 10/31/16 2128  Gross per 24 hour  Intake                0 ml  Output              150 ml  Net             -150 ml   Filed Weights   11/01/16 0038 11/01/16 0454  Weight: 88.5 kg (195 lb 1.7 oz) 88.9 kg (196 lb)    Examination:  General exam: Appears calm and comfortable  Respiratory system: Clear to auscultation. Respiratory effort normal. No wheezing or crackle Cardiovascular system: S1 & S2 heard, RRR.  No pedal edema. Gastrointestinal system: Abdomen is nondistended, soft and nontender. Normal bowel sounds heard. Central nervous system: Alert and oriented. No focal neurological deficits. Extremities: left hand with Ace wrap Skin: No rashes, lesions or ulcers Psychiatry: Judgement and insight appear normal. Mood & affect appropriate.     Data Reviewed: I have personally reviewed following labs and imaging studies  CBC:  Recent Labs Lab 10/31/16 1327  WBC 5.0  HGB 12.7*  HCT 38.8*  MCV 109.3*  PLT 622   Basic Metabolic Panel:  Recent Labs Lab 10/31/16 1523  NA 139  K 4.1  CL 105  CO2 23  GLUCOSE 113*  BUN 17  CREATININE 1.06  CALCIUM 9.0   GFR: Estimated Creatinine Clearance: 54.3 mL/min (by C-G formula based on SCr of 1.06 mg/dL). Liver Function Tests: No results for input(s): AST, ALT, ALKPHOS, BILITOT, PROT, ALBUMIN in the last 168 hours. No results for input(s): LIPASE, AMYLASE in the last 168 hours. No results for input(s): AMMONIA in the last 168 hours. Coagulation Profile: No results for input(s): INR, PROTIME in the last 168 hours. Cardiac  Enzymes:  Recent Labs Lab 10/31/16 1523  TROPONINI <0.03   BNP (last 3 results) No results for input(s): PROBNP in the last 8760 hours. HbA1C: No results for input(s): HGBA1C in the last 72 hours. CBG:  Recent Labs Lab 10/31/16 1332 11/01/16 0554  GLUCAP 141* 95   Lipid Profile: No results for input(s): CHOL, HDL, LDLCALC, TRIG, CHOLHDL, LDLDIRECT in the last 72 hours. Thyroid Function Tests: No results for input(s): TSH, T4TOTAL, FREET4, T3FREE, THYROIDAB in the last 72 hours. Anemia Panel: No results for input(s): VITAMINB12, FOLATE, FERRITIN, TIBC, IRON, RETICCTPCT in the last 72 hours. Sepsis Labs: No results for input(s): PROCALCITON, LATICACIDVEN in the last 168 hours.  No results found for this or any previous visit (from the past 240 hour(s)).       Radiology Studies: Dg Ribs Unilateral W/chest Left  Result Date: 10/31/2016 CLINICAL DATA:  Fall, rib pain, initial encounter. EXAM: LEFT RIBS AND CHEST - 3+ VIEW COMPARISON:  None. FINDINGS: Trachea is midline. Heart size normal. Thoracic aorta is calcified. Lungs are clear. No pleural fluid. No pneumothorax. Dedicated views of the left ribs show no acute fracture. IMPRESSION: No acute findings. Electronically Signed   By: Lorin Picket M.D.   On: 10/31/2016 15:16   Ct Head Wo Contrast  Result Date: 10/31/2016 CLINICAL DATA:  83 year old male post fall/syncopal episode. Denies loss of consciousness or hitting head. Initial encounter. EXAM: CT HEAD WITHOUT CONTRAST TECHNIQUE: Contiguous axial images were obtained from the base of the skull through the vertex without intravenous contrast. COMPARISON:  None. FINDINGS: Brain: No intracranial hemorrhage or CT evidence of large acute infarct. Moderate chronic microvascular changes. Moderate global atrophy. Ventricular prominence felt to be related to atrophy rather than hydrocephalus. No intracranial mass lesion noted on this unenhanced exam. Vascular: Vascular calcifications.  Skull: No skull fracture. Sinuses/Orbits: No acute orbital abnormality. Visualized sinuses are clear. Other: Mastoid air cells and middle ear cavities are clear. IMPRESSION: No skull fracture or intracranial hemorrhage. No CT evidence of large acute infarct. Moderate chronic microvascular changes. Moderate global atrophy. Vascular calcifications. Electronically Signed   By: Genia Del M.D.   On: 10/31/2016 15:30   Dg Hand Complete Left  Result Date: 10/31/2016 CLINICAL DATA:  83 year old male status post fall at home with pain of the fourth and fifth metacarpals EXAM: LEFT HAND - COMPLETE 3+ VIEW COMPARISON:  Concurrently obtained radiographs of the chest and left ribs FINDINGS: Acute comminuted an mildly displaced fractures of the fourth can fifth metacarpals. No significant angulation. The remaining bones and joints demonstrate no evidence of acute injury. 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 IMPRESSION: 1. Comminuted and mildly displaced fractures through the fourth and fifth metacarpals without significant angulation. 2. Thumb interphalangeal joint osteoarthritis. Electronically Signed   By: Jacqulynn Cadet M.D.   On: 10/31/2016 15:16        Scheduled Meds: . aspirin  81 mg Oral Daily  . atorvastatin  10 mg Oral  Daily  . calcium carbonate  1,250 mg Oral Q breakfast  . cholecalciferol  1,000 Units Oral Daily  . donepezil  10 mg Oral BID  . enoxaparin (LOVENOX) injection  40 mg Subcutaneous Q24H  . levothyroxine  50 mcg Oral QAC breakfast  . methylphenidate  10 mg Oral Daily  . PARoxetine  10 mg Oral QHS  . sodium chloride flush  3 mL Intravenous Q12H  . tamsulosin  0.4 mg Oral Daily   Continuous Infusions: . sodium chloride       LOS: 0 days    Dron Tanna Furry, MD [facility] Pager [phone]  If 7PM-7AM, please contact night-coverage [email or web address] Password [facility] 11/01/2016, 12:27 PM

## 2016-11-01 NOTE — Progress Notes (Signed)
Race Latour WLS:[id number] DOB: 1928/11/20 DOA: 10/31/2016  PCP: Haywood Pao, MD  Patient coming from: Home  I have personally briefly reviewed patient's old medical records in [facility]  Chief Complaint: Syncope  HPI: Spencer Howard is a 83 y.o. male with medical history significant of dementia (Alzheimer's vs EtOH related dementia), HTN.  Patient presents to ED with L hand injury.  Patient apparently was upset this morning, was trying to throw something, lost balance causing fall, injured left hand with fall.  Had second fall associated with syncopal event during visit to doctors office to get X-Ray of hand later in day today.   ED Course: Ortho saw patient and hand in splint.  Patient has no SFSR criteria but EDP uncomfortable sending patient home so requesting observation.   Review of Systems: As per HPI otherwise 10 point review of systems negative.       Past Medical History:  Diagnosis Date  . Alcohol abuse with alcohol-induced mental disorder ([place])   . Dementia associated with alcoholism ([place])    Alzheimer's dementia versus alcohol related dementia. 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 Depression   . High cholesterol   . Hypertension   . Hypothyroidism (acquired)          Past Surgical History:  Procedure Laterality Date  . ELBOW SURGERY Left 1995   Broken  . NM MYOVIEW LTD  02/2016   Normal LV function - EF 60-65%. No EKG changes. No ischemia or infarction noted on imaging. LOW RISK  . TONSILLECTOMY  1942  . TRANSTHORACIC ECHOCARDIOGRAM  02/2016   Moderate concentric LVH with EF 55-60%. GR 1 DD. Mild LA dilation. Mildly increased PA pressures  . VARICOCELECTOMY  1967     reports that he quit smoking about 47 years ago. He has never used smokeless tobacco. He reports that he drinks alcohol. He reports that he does not use drugs.  No Known Allergies       Family History  Problem Relation  Age of Onset  . Leukemia Father   . Colon cancer Mother   . Dementia Neg Hx    Patient alert to self and situation, is pleasantly confused and continues to ask you the same question about his phone, his wife, and what the telemetry box is and what his IV is.  Patient can be agreeable to your explanation and then continues to ask you the same question.  Patient has abrasion on left around the elbow open to air.  There are some excoriation marks and bruising to his right arm.  Left hand gutter splint on left hand and lower arm.  IV in right hand saline locked.  Telemetry box 29 placed on patient, call bell within reach, patient hearing aid placed in pink denture cup with patient label.  Will continue to monitor.  Wilson Singer, RN

## 2016-11-02 DIAGNOSIS — F0391 Unspecified dementia with behavioral disturbance: Secondary | ICD-10-CM

## 2016-11-02 DIAGNOSIS — S6292XA Unspecified fracture of left wrist and hand, initial encounter for closed fracture: Secondary | ICD-10-CM

## 2016-11-02 DIAGNOSIS — R55 Syncope and collapse: Secondary | ICD-10-CM

## 2016-11-02 DIAGNOSIS — S6292XD Unspecified fracture of left wrist and hand, subsequent encounter for fracture with routine healing: Secondary | ICD-10-CM

## 2016-11-02 DIAGNOSIS — I951 Orthostatic hypotension: Principal | ICD-10-CM

## 2016-11-02 DIAGNOSIS — E038 Other specified hypothyroidism: Secondary | ICD-10-CM

## 2016-11-02 DIAGNOSIS — I10 Essential (primary) hypertension: Secondary | ICD-10-CM

## 2016-11-02 DIAGNOSIS — F0390 Unspecified dementia without behavioral disturbance: Secondary | ICD-10-CM

## 2016-11-02 LAB — GLUCOSE, CAPILLARY: GLUCOSE-CAPILLARY: 100 mg/dL — AB (ref 65–99)

## 2016-11-02 MED ORDER — FOLIC ACID 1 MG PO TABS
1.0000 mg | ORAL_TABLET | Freq: Every day | ORAL | Status: DC
  Administered 2016-11-02 – 2016-11-04 (×3): 1 mg via ORAL
  Filled 2016-11-02 (×3): qty 1

## 2016-11-02 MED ORDER — HALOPERIDOL LACTATE 5 MG/ML IJ SOLN
2.0000 mg | Freq: Once | INTRAMUSCULAR | Status: AC
  Administered 2016-11-02: 2 mg via INTRAVENOUS

## 2016-11-02 MED ORDER — LORAZEPAM 2 MG/ML IJ SOLN
1.0000 mg | Freq: Once | INTRAMUSCULAR | Status: AC
  Administered 2016-11-02: 1 mg via INTRAVENOUS

## 2016-11-02 MED ORDER — LORAZEPAM 2 MG/ML IJ SOLN
2.0000 mg | INTRAMUSCULAR | Status: DC | PRN
  Administered 2016-11-02: 2 mg via INTRAVENOUS
  Filled 2016-11-02: qty 1

## 2016-11-02 MED ORDER — LORAZEPAM 1 MG PO TABS: 1.0000 mg | ORAL_TABLET | Freq: Four times a day (QID) | ORAL | Status: DC | PRN

## 2016-11-02 MED ORDER — ADULT MULTIVITAMIN W/MINERALS CH
1.0000 | ORAL_TABLET | Freq: Every day | ORAL | Status: DC
  Administered 2016-11-02 – 2016-11-04 (×3): 1 via ORAL
  Filled 2016-11-02 (×3): qty 1

## 2016-11-02 MED ORDER — VITAMIN B-1 100 MG PO TABS
100.0000 mg | ORAL_TABLET | Freq: Every day | ORAL | Status: DC
  Administered 2016-11-02 – 2016-11-04 (×3): 100 mg via ORAL
  Filled 2016-11-02 (×3): qty 1

## 2016-11-02 MED ORDER — LORAZEPAM 2 MG/ML IJ SOLN
INTRAMUSCULAR | Status: AC
  Filled 2016-11-02: qty 1

## 2016-11-02 MED ORDER — HALOPERIDOL LACTATE 5 MG/ML IJ SOLN
INTRAMUSCULAR | Status: AC
  Filled 2016-11-02: qty 1

## 2016-11-02 MED ORDER — LORAZEPAM 1 MG PO TABS: 2.0000 mg | ORAL_TABLET | ORAL | Status: DC | PRN

## 2016-11-02 MED ORDER — LORAZEPAM 2 MG/ML IJ SOLN
1.0000 mg | Freq: Four times a day (QID) | INTRAMUSCULAR | Status: DC | PRN
  Administered 2016-11-02: 1 mg via INTRAVENOUS
  Filled 2016-11-02: qty 1

## 2016-11-02 MED ORDER — THIAMINE HCL 100 MG/ML IJ SOLN: 100.0000 mg | Freq: Every day | INTRAMUSCULAR | Status: DC

## 2016-11-02 NOTE — Progress Notes (Signed)
Progress Note  Patient Name: Spencer Howard Date of Encounter: 11/02/2016  Primary Cardiologist: Dr. Ellyn Hack  Subjective   Sleepy.  Had difficulty with agitation overnight and received ativan   Inpatient Medications    Scheduled Meds: . aspirin  81 mg Oral Daily  . atorvastatin  10 mg Oral Daily  . calcium carbonate  1,250 mg Oral Q breakfast  . cholecalciferol  1,000 Units Oral Daily  . donepezil  10 mg Oral BID  . enoxaparin (LOVENOX) injection  40 mg Subcutaneous Q24H  . folic acid  1 mg Oral Daily  . haloperidol lactate      . levothyroxine  50 mcg Oral QAC breakfast  . LORazepam      . methylphenidate  10 mg Oral Daily  . multivitamin with minerals  1 tablet Oral Daily  . PARoxetine  10 mg Oral QHS  . sodium chloride flush  3 mL Intravenous Q12H  . tamsulosin  0.4 mg Oral Daily  . thiamine  100 mg Oral Daily   Or  . thiamine  100 mg Intravenous Daily   Continuous Infusions: . sodium chloride 100 mL (11/01/16 1230)   PRN Meds: acetaminophen, LORazepam **OR** LORazepam, ondansetron (ZOFRAN) IV   Vital Signs    Vitals:   11/01/16 2024 11/01/16 2026 11/01/16 2027 11/02/16 0527  BP: (!) 157/82 120/62 (!) 121/102 (!) 148/80  Pulse: 71 81 (!) 199 90  Resp: 18   18  Temp: 97.8 F (36.6 C)   98.2 F (36.8 C)  TempSrc: Oral     SpO2: 97%   98%  Weight:    89.1 kg (196 lb 8 oz)  Height:        Intake/Output Summary (Last 24 hours) at 11/02/16 0901 Last data filed at 11/02/16 0457  Gross per 24 hour  Intake              550 ml  Output              300 ml  Net              250 ml   Filed Weights   11/01/16 0038 11/01/16 0454 11/02/16 0527  Weight: 88.5 kg (195 lb 1.7 oz) 88.9 kg (196 lb) 89.1 kg (196 lb 8 oz)    Telemetry    Sinus rhythm.  Frequent PACx - Personally Reviewed  ECG    n/a - Personally Reviewed  Physical Exam   GEN: Well-appearing.  Sleepy.  No acute distress.   Neck: No JVD Cardiac: RRR, no murmurs, rubs, or gallops.  Respiratory:  Clear to auscultation bilaterally. GI: Soft, nontender, non-distended  MS: No edema; No deformity.  L arm wrapped.  Neuro:  Nonfocal  Psych: Normal affect   Labs    Chemistry Recent Labs Lab 10/31/16 1523  NA 139  K 4.1  CL 105  CO2 23  GLUCOSE 113*  BUN 17  CREATININE 1.06  CALCIUM 9.0  GFRNONAA >60  GFRAA >60  ANIONGAP 11     Hematology Recent Labs Lab 10/31/16 1327  WBC 5.0  RBC 3.55*  HGB 12.7*  HCT 38.8*  MCV 109.3*  MCH 35.8*  MCHC 32.7  RDW 15.2  PLT 220    Cardiac Enzymes Recent Labs Lab 10/31/16 1523  TROPONINI <0.03   No results for input(s): TROPIPOC in the last 168 hours.   BNPNo results for input(s): BNP, PROBNP in the last 168 hours.   DDimer No results for input(s): DDIMER in  the last 168 hours.   Radiology    Dg Ribs Unilateral W/chest Left  Result Date: 10/31/2016 CLINICAL DATA:  Fall, rib pain, initial encounter. EXAM: LEFT RIBS AND CHEST - 3+ VIEW COMPARISON:  None. FINDINGS: Trachea is midline. Heart size normal. Thoracic aorta is calcified. Lungs are clear. No pleural fluid. No pneumothorax. Dedicated views of the left ribs show no acute fracture. IMPRESSION: No acute findings. Electronically Signed   By: Lorin Picket M.D.   On: 10/31/2016 15:16   Ct Head Wo Contrast  Result Date: 10/31/2016 CLINICAL DATA:  83 year old male post fall/syncopal episode. Denies loss of consciousness or hitting head. Initial encounter. EXAM: CT HEAD WITHOUT CONTRAST TECHNIQUE: Contiguous axial images were obtained from the base of the skull through the vertex without intravenous contrast. COMPARISON:  None. FINDINGS: Brain: No intracranial hemorrhage or CT evidence of large acute infarct. Moderate chronic microvascular changes. Moderate global atrophy. Ventricular prominence felt to be related to atrophy rather than hydrocephalus. No intracranial mass lesion noted on this unenhanced exam. Vascular: Vascular calcifications. Skull: No skull fracture.  Sinuses/Orbits: No acute orbital abnormality. Visualized sinuses are clear. Other: Mastoid air cells and middle ear cavities are clear. IMPRESSION: No skull fracture or intracranial hemorrhage. No CT evidence of large acute infarct. Moderate chronic microvascular changes. Moderate global atrophy. Vascular calcifications. Electronically Signed   By: Genia Del M.D.   On: 10/31/2016 15:30   Dg Hand Complete Left  Result Date: 10/31/2016 CLINICAL DATA:  83 year old male status post fall at home with pain of the fourth and fifth metacarpals EXAM: LEFT HAND - COMPLETE 3+ VIEW COMPARISON:  Concurrently obtained radiographs of the chest and left ribs FINDINGS: Acute comminuted an mildly displaced fractures of the fourth can fifth metacarpals. No significant angulation. The remaining bones and joints demonstrate no evidence of acute injury. 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 IMPRESSION: 1. Comminuted and mildly displaced fractures through the fourth and fifth metacarpals without significant angulation. 2. Thumb interphalangeal joint osteoarthritis. Electronically Signed   By: Jacqulynn Cadet M.D.   On: 10/31/2016 15:16    Cardiac Studies   Echo 03/25/16: Study Conclusions  - Left ventricle: There was moderate concentric hypertrophy.   Systolic function was normal. The estimated ejection fraction was   in the range of 55% to 60%. Wall motion was normal; there were no   regional wall motion abnormalities. Doppler parameters are   consistent with abnormal left ventricular relaxation (grade 1   diastolic dysfunction). Doppler parameters are consistent with   elevated ventricular end-diastolic filling pressure. - Aortic valve: There was trivial regurgitation. - Mitral valve: Calcified annulus. - Left atrium: The atrium was mildly dilated. - Right ventricle: The cavity size was mildly dilated. - Pulmonary arteries: Systolic pressure was mildly increased. PA   peak  pressure: 36 mm Hg (S).   Patient Profile     Spencer Howard is an 83M with hyperlipidemia, hypothyroidism, prior alcohol abuse (currently 1 glass of wine daily) with associated dementia and prior tobacco abuse here with syncope and orthostatic hypotension.  Assessment & Plan    # Orthostatic hypotension:  Symptoms have improved.  His BP still drops, but only from 148/80 lying to 121/102 standing.  I again encouraged hydration and salt intake with his wife.  I have ordered Ted hose.  No midodrine or florinef given baseline hypotension.  We will arrange outpatient follow up.  Signed, Skeet Latch, MD  11/02/2016, 9:01 AM

## 2016-11-02 NOTE — Progress Notes (Signed)
Shift Note:  At approximately 2000, patient became aggressive by yelling and using foul language towards staff. Patient also began trying to get out of bed repeatedly even after being redirected and reoriented multiple times. MD notified and new orders placed for Ativan and 4 point, non-violent soft restraints. Ativan IV given. Restraints not immediately initiated due to RN judgement and possibility of increased agitation.  Patient continued to get out of bed after Ativan and became increasingly agitated and confused by the IV and IV fluids. RN attempted to redirect patient without success. Patient stated he was in some type of "research lab" and wanted to know our "purpose". Again the RN attempted to redirect and reorient the patient. However, the patient remained confused and aggressive attempting to leave. RN placed restraints due to safety concerns. Patient has unstable gait and refuses help when ambulating. Restraints caused patient to become more agitated and confused. This RN and CN spoke with patient about why the restraints were placed. Patient was unable to comprehend the reason for the restraints. CN removed restraints to allow patient bathroom privileges and to call his wife. Security was called for safety measures for staff and patient. RN called on-call physician to update on situation. New order placed and implemented. Security discussed with patient importance of staying in bed and being safe. Patient promised to stay in bed and use call bell when he needed assistance. Will continue to monitor patient closely for physical and emotional needs.

## 2016-11-02 NOTE — Discharge Summary (Addendum)
Physician Discharge Summary  Spencer Howard GUY:[id number] DOB: 1928/10/01 DOA: 10/31/2016  PCP: Haywood Pao, MD  Admit date: 10/31/2016 Discharge date: 11/04/2016  Admitted From: home Disposition:  home   Recommendations for Outpatient Follow-up:  1. Encourage oral hydration 2. He needs Orthostatic vital signs checked 3. Needs a bladder ultrasound to assess for urinary retention as we have had to hold his Flomax.    Discharge Condition:  stable   CODE STATUS:  Full code   Consultations:  none    Discharge Diagnoses:  Principal Problem:   Syncope/   Orthostatic hypotension Active Problems:   Essential hypertension   HLD (hyperlipidemia)   Hypothyroidism   Closed left hand fracture   Dementia    Subjective: No complaints. Have spoken with wife at bedside  Brief Summary: 83 year old male with history of dementia, hypertension presented after a mechanical fall while trying to throw something. he went to a doctor's office for an xray and while there, he had a syncopal episode at PCPs office.  Hospital Course:  Syncope - with orthostatic hypotension which did not resolve with IVF- the patient was very symptomatic on standing on 8/9. Flomax held and Midodrine started. Not orthostatic today and able to ambulate down the hall. Have had to add Norvasc to control BP on Midodrine. -CT head with moderate global atrophy with no acute finding. - He had cardiac nuclear stress test in December 2017 which was unremarkable, echo with EF of 55-60% with normal wall motion.  - cardiology consulted- stated that no further investigation necessary at this time  Mildly displaced left fourth and fifth extra-articular metacarpal fractures, without significant angulation - evaluated by Dr Grandville Silos whose office will schedule a f/u  - has splint and needs to f/u in 10-15 days  Hypothyroidism - cont Synthroid  HLD - cont statin  ? Alcohol abuse with withdrawal - was on a CIWA scale and  receiving Ativan for withdrawl - per wife, he drinks one small bottle of wine a day - she states he is normally confused and has tremors at baseline- doubt he is in withdrawal and the reason he his scoring high on the scale is due to dementia and chronic termor's - advised his wife to stop buying him alcohol  Dementia - very poor short term memory per wife - cont Aricept   Discharge Instructions  Discharge Instructions    Diet - low sodium heart healthy    Complete by:  As directed    Increase activity slowly    Complete by:  As directed      Allergies as of 11/04/2016   No Known Allergies     Medication List    STOP taking these medications   tamsulosin 0.4 MG Caps capsule Commonly known as:  FLOMAX     TAKE these medications   alendronate 70 MG tablet Commonly known as:  FOSAMAX Take 70 mg by mouth once a week.   amLODipine 10 MG tablet Commonly known as:  NORVASC Take 1 tablet (10 mg total) by mouth daily.   aspirin 81 MG tablet Take 81 mg by mouth daily.   atorvastatin 10 MG tablet Commonly known as:  LIPITOR Take 10 mg by mouth daily.   calcium carbonate 1500 (600 Ca) MG Tabs tablet Commonly known as:  OSCAL Take 1,500 mg by mouth daily.   donepezil 10 MG tablet Commonly known as:  ARICEPT Take 1 tablet (10 mg total) by mouth 2 (two) times daily.   levothyroxine 50  MCG tablet Commonly known as:  SYNTHROID, LEVOTHROID Take 50 mcg by mouth daily before breakfast.   methylphenidate 10 MG tablet Commonly known as:  RITALIN Take 10 mg by mouth every morning.   midodrine 10 MG tablet Commonly known as:  PROAMATINE Take 1 tablet (10 mg total) by mouth 3 (three) times daily with meals.   MULTIVITAMIN ADULT PO Take 1 tablet by mouth daily.   PARoxetine 10 MG tablet Commonly known as:  PAXIL Take 10 mg by mouth at bedtime.   Vitamin D3 10000 units Tabs Take 1 tablet by mouth daily.      Follow-up Information    Milly Jakob, MD Follow up.    Specialty:  Orthopedic Surgery Why:  My office will call this patient to arrange for follow-up for approximately 2 weeks Contact information: [place] [address] [place], [place], PA-C Follow up on 11/18/2016.   Specialties:  Physician Assistant, Cardiology Why:  8:30 am Contact information: [address] [place] [address] [place], [facility] Follow up.   Why:  home health services arranged, office will call and set up home visits Contact information: [address] [place] [address] [phone]        Tisovec, Fransico Him, MD Follow up.   Specialty:  Internal Medicine Why:  please take patient to PCP's office in 1 week. Take this paper with you. He needs Orthostatic vital signs checked and needs a bladder ultrasound to assess for urinary retention as we have had to hold his Flomax.  Contact information: [address] [place] [address] [phone]          No Known Allergies   Procedures/Studies:    Dg Ribs Unilateral W/chest Left  Result Date: 10/31/2016 CLINICAL DATA:  Fall, rib pain, initial encounter. EXAM: LEFT RIBS AND CHEST - 3+ VIEW COMPARISON:  None. FINDINGS: Trachea is midline. Heart size normal. Thoracic aorta is calcified. Lungs are clear. No pleural fluid. No pneumothorax. Dedicated views of the left ribs show no acute fracture. IMPRESSION: No acute findings. Electronically Signed   By: Lorin Picket M.D.   On: 10/31/2016 15:16   Ct Head Wo Contrast  Result Date: 10/31/2016 CLINICAL DATA:  83 year old male post fall/syncopal episode. Denies loss of consciousness or hitting head. Initial encounter. EXAM: CT HEAD WITHOUT CONTRAST TECHNIQUE: Contiguous axial images were obtained from the base of the skull through the vertex without intravenous contrast. COMPARISON:  None. FINDINGS: Brain: No intracranial hemorrhage or CT evidence of large acute  infarct. Moderate chronic microvascular changes. Moderate global atrophy. Ventricular prominence felt to be related to atrophy rather than hydrocephalus. No intracranial mass lesion noted on this unenhanced exam. Vascular: Vascular calcifications. Skull: No skull fracture. Sinuses/Orbits: No acute orbital abnormality. Visualized sinuses are clear. Other: Mastoid air cells and middle ear cavities are clear. IMPRESSION: No skull fracture or intracranial hemorrhage. No CT evidence of large acute infarct. Moderate chronic microvascular changes. Moderate global atrophy. Vascular calcifications. Electronically Signed   By: Genia Del M.D.   On: 10/31/2016 15:30   Dg Hand Complete Left  Result Date: 10/31/2016 CLINICAL DATA:  83 year old male status post fall at home with pain of the fourth and fifth metacarpals EXAM: LEFT HAND - COMPLETE 3+ VIEW COMPARISON:  Concurrently obtained radiographs of the chest and left ribs FINDINGS: Acute comminuted an mildly displaced fractures of the fourth can fifth  metacarpals. No significant angulation. The remaining bones and joints demonstrate no evidence of acute injury. 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 IMPRESSION: 1. Comminuted and mildly displaced fractures through the fourth and fifth metacarpals without significant angulation. 2. Thumb interphalangeal joint osteoarthritis. Electronically Signed   By: Jacqulynn Cadet M.D.   On: 10/31/2016 15:16       Discharge Exam: Vitals:   11/04/16 0456 11/04/16 0748  BP: (!) 176/66 (!) 153/102  Pulse: 69 63  Resp: 18 18  Temp: 98.3 F (36.8 C)   SpO2: 95% 96%   Vitals:   11/03/16 2133 11/04/16 0323 11/04/16 0456 11/04/16 0748  BP: (!) 162/80  (!) 176/66 (!) 153/102  Pulse: 75  69 63  Resp: 18  18 18   Temp: 98.7 F (37.1 C)  98.3 F (36.8 C)   TempSrc: Oral  Oral   SpO2: 98%  95% 96%  Weight:  90.7 kg (200 lb)    Height:        General: Pt is alert, awake, not in acute  distress Cardiovascular: RRR, S1/S2 +, no rubs, no gallops Respiratory: CTA bilaterally, no wheezing, no rhonchi Abdominal: Soft, NT, ND, bowel sounds + Extremities: no edema, no cyanosis    The results of significant diagnostics from this hospitalization (including imaging, microbiology, ancillary and laboratory) are listed below for reference.     Microbiology: No results found for this or any previous visit (from the past 240 hour(s)).   Labs: BNP (last 3 results) No results for input(s): BNP in the last 8760 hours. Basic Metabolic Panel:  Recent Labs Lab 10/31/16 1523  NA 139  K 4.1  CL 105  CO2 23  GLUCOSE 113*  BUN 17  CREATININE 1.06  CALCIUM 9.0   Liver Function Tests: No results for input(s): AST, ALT, ALKPHOS, BILITOT, PROT, ALBUMIN in the last 168 hours. No results for input(s): LIPASE, AMYLASE in the last 168 hours. No results for input(s): AMMONIA in the last 168 hours. CBC:  Recent Labs Lab 10/31/16 1327  WBC 5.0  HGB 12.7*  HCT 38.8*  MCV 109.3*  PLT 220   Cardiac Enzymes:  Recent Labs Lab 10/31/16 1523  TROPONINI <0.03   BNP: Invalid input(s): POCBNP CBG:  Recent Labs Lab 10/31/16 1332 11/01/16 0554 11/02/16 0705 11/03/16 0647 11/04/16 0826  GLUCAP 141* 95 100* 103* 95   D-Dimer No results for input(s): DDIMER in the last 72 hours. Hgb A1c No results for input(s): HGBA1C in the last 72 hours. Lipid Profile No results for input(s): CHOL, HDL, LDLCALC, TRIG, CHOLHDL, LDLDIRECT in the last 72 hours. Thyroid function studies  Recent Labs  11/01/16 1506  TSH 4.804*   Anemia work up No results for input(s): VITAMINB12, FOLATE, FERRITIN, TIBC, IRON, RETICCTPCT in the last 72 hours. Urinalysis    Component Value Date/Time   COLORURINE YELLOW 10/31/2016 2127   APPEARANCEUR HAZY (A) 10/31/2016 2127   LABSPEC 1.023 10/31/2016 2127   PHURINE 6.0 10/31/2016 2127   GLUCOSEU NEGATIVE 10/31/2016 2127   HGBUR MODERATE (A)  10/31/2016 2127   BILIRUBINUR NEGATIVE 10/31/2016 2127   [place] NEGATIVE 10/31/2016 2127   PROTEINUR 100 (A) 10/31/2016 2127   NITRITE NEGATIVE 10/31/2016 2127   LEUKOCYTESUR NEGATIVE 10/31/2016 2127   Sepsis Labs Invalid input(s): PROCALCITONIN,  WBC,  LACTICIDVEN Microbiology No results found for this or any previous visit (from the past 240 hour(s)).   Time coordinating discharge: Over 30 minutes  SIGNED:   Debbe Odea, MD  [facility] 11/04/2016, 11:36 AM Pager   If 7PM-7AM, please contact night-coverage [email or web address] Password [email or web address]

## 2016-11-03 ENCOUNTER — Ambulatory Visit: Payer: Self-pay | Admitting: Neurology

## 2016-11-03 ENCOUNTER — Telehealth: Payer: Self-pay | Admitting: Cardiology

## 2016-11-03 LAB — GLUCOSE, CAPILLARY: GLUCOSE-CAPILLARY: 103 mg/dL — AB (ref 65–99)

## 2016-11-03 MED ORDER — SODIUM CHLORIDE 0.9 % IV BOLUS (SEPSIS)
500.0000 mL | Freq: Two times a day (BID) | INTRAVENOUS | Status: DC
  Administered 2016-11-04: 500 mL via INTRAVENOUS

## 2016-11-03 MED ORDER — SODIUM CHLORIDE 1 G PO TABS
1.0000 g | ORAL_TABLET | Freq: Two times a day (BID) | ORAL | Status: DC
  Administered 2016-11-03 – 2016-11-04 (×2): 1 g via ORAL
  Filled 2016-11-03 (×3): qty 1

## 2016-11-03 MED ORDER — SODIUM CHLORIDE 0.9 % IV BOLUS (SEPSIS): 500.0000 mL | Freq: Once | INTRAVENOUS | Status: DC

## 2016-11-03 MED ORDER — MIDODRINE HCL 5 MG PO TABS
10.0000 mg | ORAL_TABLET | Freq: Three times a day (TID) | ORAL | Status: DC
Start: 2016-11-03 — End: 2016-11-04
  Administered 2016-11-03 – 2016-11-04 (×2): 10 mg via ORAL
  Filled 2016-11-03 (×3): qty 2

## 2016-11-03 NOTE — Progress Notes (Signed)
Patient unable to do orthostatic vitals at this time due to lethargy.  Will continue to monitor patient and attempt orthostatic vital signs again when he is more alert.

## 2016-11-03 NOTE — Progress Notes (Signed)
MD informed that the patient is very weak this AM and unable to stand for 3 minutes to check orthostatic BP. Will continue to monitor.

## 2016-11-03 NOTE — Telephone Encounter (Signed)
Closed encounter °

## 2016-11-03 NOTE — Progress Notes (Signed)
PT Progress Note  Late entry due to EPIC system down.    11/03/16 0800  PT Visit Information  Last PT Received On 11/02/16  Assistance Needed +1  History of Present Illness Patient is a 83 y/o male who presents with syncopal episode and fall. s/p left hand fx. PMH includes ETOH use, dementia assoicated with ETOH, HLD, HTN, hypothyroidism and hx of tobacco use.   Precautions  Precautions Fall  Restrictions  Weight Bearing Restrictions No  Other Position/Activity Restrictions Left hand wrapped in splint- no orders for WB restrictions/precautions in chart.  Pain Assessment  Pain Assessment No/denies pain  Cognition  Arousal/Alertness Lethargic  Behavior During Therapy Impulsive  Overall Cognitive Status History of cognitive impairments - at baseline  Area of Impairment Orientation;Attention;Memory;Following commands;Problem solving;Safety/judgement  Orientation Level Disoriented to;Time;Situation  Current Attention Level Sustained  Memory Decreased short-term memory  Following Commands Follows one step commands with increased time  Safety/Judgement Decreased awareness of safety;Decreased awareness of deficits  Problem Solving Slow processing;Decreased initiation;Difficulty sequencing;Requires verbal cues  General Comments pt with dementia at baseline  Bed Mobility  Overal bed mobility Needs Assistance  Bed Mobility Supine to Sit;Sit to Supine  Supine to sit Min assist  Sit to supine Min assist  General bed mobility comments assist to initiate, poor sequencing, assist to raise trunk and to assist LEs back into bed, cues to avoid using L hand on rail  Transfers  Overall transfer level Needs assistance  Equipment used Rolling walker (2 wheeled)  Transfers Sit to/from Stand  Sit to Stand Min assist  General transfer comment steadying assist, cues for hand placement, denies dizziness; however, pt closing his eyes and maintaining tripod position  Ambulation/Gait  General Gait Details  deferred secondary to lethargy  Balance  Overall balance assessment Needs assistance;History of Falls  Sitting-balance support Feet supported;No upper extremity supported  Sitting balance-Leahy Scale Fair  Sitting balance - Comments tripods with elbows on knees  Postural control Other (comment) (anterior lean)  Standing balance support During functional activity  Standing balance-Leahy Scale Poor  Standing balance comment requires at least one hand support  PT - End of Session  Equipment Utilized During Treatment Gait belt  Activity Tolerance Patient limited by lethargy  Patient left in bed;with call bell/phone within reach;with family/visitor present  Nurse Communication Mobility status;Other (comment) (pt very lethargic and wife concerned with d/c home)  PT - Assessment/Plan  PT Plan Current plan remains appropriate  PT Visit Diagnosis Unsteadiness on feet (R26.81);Difficulty in walking, not elsewhere classified (R26.2);Muscle weakness (generalized) (M62.81)  PT Frequency (ACUTE ONLY) Min 3X/week  Follow Up Recommendations Home health PT;Supervision/Assistance - 24 hour  PT equipment None recommended by PT  AM-PAC PT "6 Clicks" Daily Activity Outcome Measure  Difficulty turning over in bed (including adjusting bedclothes, sheets and blankets)? 3  Difficulty moving from lying on back to sitting on the side of the bed?  3  Difficulty sitting down on and standing up from a chair with arms (e.g., wheelchair, bedside commode, etc,.)? 1  Help needed moving to and from a bed to chair (including a wheelchair)? 3  Help needed walking in hospital room? 3  Help needed climbing 3-5 steps with a railing?  2  6 Click Score 15  Mobility G Code  CK  PT Goal Progression  Progress towards PT goals Not progressing toward goals - comment (lethargy)  Acute Rehab PT Goals  PT Goal Formulation With patient  Time For Goal Achievement 11/15/16  Potential to Achieve  Goals Fair  PT Time Calculation   PT Start Time (ACUTE ONLY) 1510  PT Stop Time (ACUTE ONLY) 1538  PT Time Calculation (min) (ACUTE ONLY) 28 min   Pt very lethargic this session, with very limited participation in mobility. Pt's wife believes this is due to a medication the pt was given the night before. Hopeful that once pt is more alert he will be able to progress with mobility. Pt would continue to benefit from skilled physical therapy services at this time while admitted and after d/c to address the below listed limitations in order to improve overall safety and independence with functional mobility.  [place], [place], [place] [phone]

## 2016-11-03 NOTE — Progress Notes (Signed)
Physical Therapy Treatment Patient Details Name: Spencer Howard MRN: [id number] DOB: 1929-03-27 Today's Date: 11/03/2016    History of Present Illness Patient is a 83 y/o male who presents with syncopal episode and fall. s/p left hand fx. PMH includes ETOH use, dementia assoicated with ETOH, HLD, HTN, hypothyroidism and hx of tobacco use.     PT Comments    Patient presents today with orthostasis. Rn notified. See below. Supine BP 151/70  HR 78 bpm  Sp02 99% Sitting BP 117/77  HR 93 bpm  Sp02 96% Standing BP 84/51  HR 112 bpm  Sp02 94% Pt asymptomatic throughout testing of orthostatics when asked if he was dizzy however pt reports he does "feel funny." Pt also with NEW trembling/shaking of BLEs in standing as well as tremoring in RUE which was not present during evaluation 2 days ago. Per wife she has never noticed this before as well. Based on above, gait training was deferred. Concerned about pt going home. Might need to consider wrapping BLEs with ace wraps or ted hose to see if this helps decrease BP drop with change in position. Will follow and progress as tolerated.   Follow Up Recommendations  Home health PT;Supervision/Assistance - 24 hour (but not ready yet)     Equipment Recommendations  None recommended by PT    Recommendations for Other Services       Precautions / Restrictions Precautions Precautions: Fall Precaution Comments: orthostatic Restrictions Weight Bearing Restrictions: No Other Position/Activity Restrictions: Left hand wrapped in splint- no orders for WB restrictions/precautions in chart.    Mobility  Bed Mobility Overal bed mobility: Needs Assistance Bed Mobility: Supine to Sit;Sit to Supine     Supine to sit: Min guard Sit to supine: Min guard   General bed mobility comments: Able to get to EOB/back in bed with increased time, no assist needed, performed x2 due to orthostatics.   Transfers Overall transfer level: Needs assistance Equipment used:  Rolling walker (2 wheeled) Transfers: Sit to/from [facility] Sit to Stand: Min assist Stand pivot transfers: Min assist       General transfer comment: Assist to power to standing with cues for hand placement/technique. Increased trembling/shaking in BLEs upon standing. Initially tried to stand without RW with pt reaching for counter for support. States not being dizzy. SPT bed to [facility] Min A for balance.   Ambulation/Gait             General Gait Details: Deferred secondary to orthostatic hypotension and safety concerns.    Stairs            Wheelchair Mobility    Modified Rankin (Stroke Patients Only)       Balance Overall balance assessment: Needs assistance;History of Falls Sitting-balance support: Feet supported;No upper extremity supported Sitting balance-Leahy Scale: Fair Sitting balance - Comments: tripods with elbows on knees when sitting on BSC and EOB. Postural control: Other (comment) (anterior lean) Standing balance support: During functional activity Standing balance-Leahy Scale: Poor Standing balance comment: Requires BUE support in standing; total A for pericare with BLEs trembling/shaking (new)                            Cognition Arousal/Alertness: Awake/alert Behavior During Therapy: Impulsive Overall Cognitive Status: History of cognitive impairments - at baseline Area of Impairment: Orientation;Attention;Memory;Following commands;Problem solving;Safety/judgement                 Orientation Level: Disoriented to;Time;Situation Current Attention  Level: Sustained Memory: Decreased short-term memory Following Commands: Follows one step commands with increased time Safety/Judgement: Decreased awareness of safety;Decreased awareness of deficits   Problem Solving: Slow processing;Decreased initiation;Difficulty sequencing;Requires verbal cues General Comments: pt with dementia at baseline. Repeating self during  session.      Exercises      General Comments General comments (skin integrity, edema, etc.): Wife present during session.      Pertinent Vitals/Pain Pain Assessment: No/denies pain    Home Living                      Prior Function            PT Goals (current goals can now be found in the care plan section) Acute Rehab PT Goals PT Goal Formulation: With patient Time For Goal Achievement: 11/15/16 Potential to Achieve Goals: Fair Progress towards PT goals: Not progressing toward goals - comment (secondary to new shaking/tremors and orthostasis.)    Frequency    Min 3X/week      PT Plan Current plan remains appropriate    Co-evaluation              AM-PAC PT "6 Clicks" Daily Activity  Outcome Measure  Difficulty turning over in bed (including adjusting bedclothes, sheets and blankets)?: None Difficulty moving from lying on back to sitting on the side of the bed? : None Difficulty sitting down on and standing up from a chair with arms (e.g., wheelchair, bedside commode, etc,.)?: Total Help needed moving to and from a bed to chair (including a wheelchair)?: A Little Help needed walking in hospital room?: A Lot Help needed climbing 3-5 steps with a railing? : A Lot 6 Click Score: 16    End of Session Equipment Utilized During Treatment: Gait belt Activity Tolerance: Patient tolerated treatment well Patient left: in bed;with call bell/phone within reach;with family/visitor present;with bed alarm set Nurse Communication: Mobility status;Other (comment) (orthostatic hypotension) PT Visit Diagnosis: Unsteadiness on feet (R26.81);Difficulty in walking, not elsewhere classified (R26.2);Muscle weakness (generalized) (M62.81)     Time: 1113-1140 PT Time Calculation (min) (ACUTE ONLY): 27 min  Charges:  $Therapeutic Activity: 23-37 mins                    G Codes:       Wray Kearns, PT, DPT [phone]     Marguarite Arbour A Malayah Demuro 11/03/2016,  11:54 AM

## 2016-11-03 NOTE — Progress Notes (Signed)
Late entry due to computer system down. Pt presents with generalized weakness, impaired balance and significant cognitive deficits, with Ativan likely contributing. Pt requires moderate to maximal assist for ADL and min assist for mobility. Will follow acutely. Plan is for home, depending on progress.   11/03/16 0700  OT Visit Information  Last OT Received On 11/03/16  Assistance Needed +1  History of Present Illness Patient is a 83 y/o male who presents with syncopal episode and fall. s/p left hand fx. PMH includes ETOH use, dementia assoicated with ETOH, HLD, HTN, hypothyroidism and hx of tobacco use.   Precautions  Precautions Fall  Restrictions  Weight Bearing Restrictions No  Other Position/Activity Restrictions Left hand wrapped in splint- no orders for WB restrictions/precautions in chart.  Home Living  Family/patient expects to be discharged to: Private residence  Living Arrangements Spouse/significant other  Available Help at Discharge Family;Available 24 hours/day  Type of Home House  Home Access Stairs to enter  Entrance Stairs-Number of Steps 4  Entrance Stairs-Rails Right  Home Layout Two level;Able to live on main level with bedroom/bathroom  Alternate Level Stairs-Number of Steps 1 flight  Bathroom Shower/Tub Walk-in Warden/ranger seat;Walker - 4 wheels;Cane - single point  Prior Function  Level of Independence Independent with assistive device(s)  Comments Pt just got a rollator yesterday and started using it. Furniture walks at baseline and rarely leaves house. Does not drive. 1 fall in last 6 months.  Communication  Communication No difficulties  Pain Assessment  Pain Assessment No/denies pain  Cognition  Arousal/Alertness Awake/alert  Behavior During Therapy Impulsive  Overall Cognitive Status Impaired/Different from baseline  Area of Impairment Orientation;Attention;Memory;Following commands;Problem  solving;Safety/judgement  Orientation Level Disoriented to;Time;Situation  Current Attention Level Sustained  Memory Decreased short-term memory  Following Commands Follows one step commands with increased time  Safety/Judgement Decreased awareness of safety;Decreased awareness of deficits  Problem Solving Slow processing;Decreased initiation;Difficulty sequencing;Requires verbal cues  General Comments pt with poor safety and memory PTA  Upper Extremity Assessment  Upper Extremity Assessment LUE deficits/detail;RUE deficits/detail  RUE Deficits / Details tremor  RUE Coordination decreased fine motor  LUE Deficits / Details splinted 4-5th digits to proximal to wrist, tremor  LUE Unable to fully assess due to immobilization  LUE Coordination decreased fine motor  Lower Extremity Assessment  Lower Extremity Assessment Defer to PT evaluation  ADL  Overall ADL's  Needs assistance/impaired  Eating/Feeding Moderate assistance;Bed level  Grooming Wash/dry hands;Sitting;Maximal assistance  Upper Body Bathing Maximal assistance;Sitting  Lower Body Bathing Total assistance;Sit to/from stand  Upper Body Dressing  Moderate assistance;Sitting  Lower Body Dressing Maximal assistance;Sit to/from stand  General ADL Comments pt perseverating on event leading up to hospitalization  Vision- History  Baseline Vision/History Wears glasses  Wears Glasses At all times  Patient Visual Report No change from baseline  Bed Mobility  Overal bed mobility Needs Assistance  Bed Mobility Supine to Sit;Sit to Supine  Supine to sit Min assist  Sit to supine Min assist  General bed mobility comments assist to initiate, poor sequencing, assist to raise trunk and to assist LEs back into bed, cues to avoid using L hand on rai.  Transfers  Overall transfer level Needs assistance  Equipment used Rolling walker (2 wheeled)  Transfers Sit to/from Stand  Sit to Stand Min assist  General transfer comment steadying  assist, cues for hand placement, denies dizziness  Balance  Overall balance assessment Needs assistance;History of Falls  Sitting-balance support Feet supported;No upper extremity supported  Sitting balance-Leahy Scale Fair  Sitting balance - Comments tripods with elbows on knees  Standing balance-Leahy Scale Poor  Standing balance comment requires at least one hand support  OT - End of Session  Equipment Utilized During Treatment Gait belt;Rolling walker  Activity Tolerance Patient tolerated treatment well  Patient left in bed;with call bell/phone within reach;with bed alarm set;with family/visitor present  OT Assessment  OT Recommendation/Assessment Patient needs continued OT Services  OT Visit Diagnosis Unsteadiness on feet (R26.81);Muscle weakness (generalized) (M62.81);Pain;History of falling (Z91.81);Other abnormalities of gait and mobility (R26.89);Other symptoms and signs involving cognitive function  OT Problem List Decreased strength;Decreased activity tolerance;Impaired balance (sitting and/or standing);Decreased coordination;Decreased cognition;Decreased safety awareness;Decreased knowledge of use of DME or AE;Impaired UE functional use  OT Plan  OT Frequency (ACUTE ONLY) Min 2X/week  OT Treatment/Interventions (ACUTE ONLY) Self-care/ADL training;DME and/or AE instruction;Therapeutic activities;Cognitive remediation/compensation;Patient/family education;Balance training  AM-PAC OT "6 Clicks" Daily Activity Outcome Measure  Help from another person eating meals? 2  Help from another person taking care of personal grooming? 2  Help from another person toileting, which includes using toliet, bedpan, or urinal? 2  Help from another person bathing (including washing, rinsing, drying)? 2  Help from another person to put on and taking off regular upper body clothing? 2  Help from another person to put on and taking off regular lower body clothing? 1  6 Click Score 11  ADL G Code  Conversion CL  OT Recommendation  Follow Up Recommendations Supervision/Assistance - 24 hour;Home health OT  Individuals Consulted  Consulted and Agree with Results and Recommendations Family member/caregiver  Family Member Consulted wife  Acute Rehab OT Goals  Patient Stated Goal to play golf again  OT Goal Formulation With patient  Time For Goal Achievement 11/17/16  Potential to Achieve Goals Good  OT Time Calculation  OT Start Time (ACUTE ONLY) 1331  OT Stop Time (ACUTE ONLY) 1407  OT Time Calculation (min) 36 min  Written Expression  Dominant Hand Left  11/03/2016 Spencer Howard, OTR/L Pager: [phone]

## 2016-11-03 NOTE — Progress Notes (Signed)
Patient did not go home yesterday because he "fell asleep" after lunch and did not wake up till 6 PM. He will go home today  Debbe Odea, MD

## 2016-11-04 DIAGNOSIS — I951 Orthostatic hypotension: Secondary | ICD-10-CM

## 2016-11-04 LAB — GLUCOSE, CAPILLARY
Glucose-Capillary: 95 mg/dL (ref 65–99)
Glucose-Capillary: 150 mg/dL — ABNORMAL HIGH (ref 65–99)

## 2016-11-04 MED ORDER — AMLODIPINE BESYLATE 10 MG PO TABS
10.0000 mg | ORAL_TABLET | Freq: Every day | ORAL | Status: DC
  Administered 2016-11-04: 10 mg via ORAL
  Filled 2016-11-04: qty 1

## 2016-11-04 MED ORDER — MIDODRINE HCL 10 MG PO TABS: 10.0000 mg | ORAL_TABLET | Freq: Three times a day (TID) | ORAL | 0 refills | Status: DC

## 2016-11-04 MED ORDER — AMLODIPINE BESYLATE 10 MG PO TABS: 10.0000 mg | ORAL_TABLET | Freq: Every day | ORAL | 0 refills | Status: DC

## 2016-11-04 NOTE — Clinical Social Work Note (Signed)
Clinical Social Work Assessment  Patient Details  Name: Tyrez Berrios MRN: [id number] Date of Birth: Jul 26, 1928  Date of referral:  11/04/16               Reason for consult:  Facility Placement                Permission sought to share information with:  Facility Sport and exercise psychologist, Family Supports Permission granted to share information::  Yes, Verbal Permission Granted  Name::     [place]::     Relationship::  Spouse  Contact Information:  [phone]  Housing/Transportation Living arrangements for the past 2 months:  [place] of Information:  Patient, Spouse Patient Interpreter Needed:  None Criminal Activity/Legal Involvement Pertinent to Current Situation/Hospitalization:  No - Comment as needed Significant Relationships:  Spouse Lives with:  Spouse Do you feel safe going back to the place where you live?  Yes Need for family participation in patient care:  Yes (Comment)  Care giving concerns:  CSW received consult for possible SNF placement at time of discharge. CSW spoke with patient and patient's wife at bedside regarding possible recommendation of SNF placement at time of discharge. Patient reported that he would ultimately like to go home if he can, and his wife echoed this plan. She would like for staff to get patient out of bed to walk him since he has not done so since he has been at the hospital. Patient expressed understanding of PT recommendation and may want home health at time of discharge. Spouse is hopeful to take him home. CSW to continue to follow and assist with discharge planning needs.   Social Worker assessment / plan:  CSW spoke with patient and his wife concerning possibility of rehab at [facility] before returning home.  Employment status:  Retired Health visitor PT Recommendations:  Home with [place], [place] / Referral to community resources:     Patient/Family's Response to care:   Patient recognizes need for rehab, but would like to explore the possibility of having home health first.  Patient/Family's Understanding of and Emotional Response to Diagnosis, Current Treatment, and Prognosis:  Patient/family is realistic regarding therapy needs and expressed being hopeful for return home. Patient expressed understanding of CSW role and discharge process as well as his medical condition. His blood pressure is worrying to his wife. No questions/concerns about plan or treatment.    Emotional Assessment Appearance:  Appears stated age Attitude/Demeanor/Rapport:  Other (Appropriate) Affect (typically observed):  Accepting, Appropriate, Pleasant Orientation:  Oriented to Self, Oriented to Situation, Oriented to Place, Oriented to  Time Alcohol / Substance use:  Not Applicable Psych involvement (Current and /or in the community):  No (Comment)  Discharge Needs  Concerns to be addressed:  Care Coordination Readmission within the last 30 days:  No Current discharge risk:  Dependent with Mobility Barriers to Discharge:  Continued Medical Work up   [employer], [place] 11/04/2016, 8:26 AM

## 2016-11-04 NOTE — Care Management Note (Signed)
Case Management Note  Patient Details  Name: Spencer Howard MRN: [id number] Date of Birth: 1928-07-04  Subjective/Objective:        Presented with syncopal episode and fall. s/p left hand fx. PMH includes ETOH use, dementia assoicated with ETOH, HLD, HTN, hypothyroidism and hx of tobacco use.  From home with wife. DME: walker.     KEEGHAN BIALY (Spouse)     [phone]              PCP: Domenick Gong  Action/Plan: Plan is to d/c to home today.  Expected Discharge Date:  11/03/16               Expected Discharge Plan:  [place]  In-House Referral:     Discharge planning Services  CM Consult  Post Acute Care Choice:    Choice offered to:  Patient  DME Arranged:    DME Agency:     HH Arranged:  PT, OT HH Agency:  [employer], referral made to [facility] pending MD's order. CM has requested orders from MD.  Status of Service:  Completed, signed off  If discussed at [place] of Stay Meetings, dates discussed:    Additional Comments:  Sharin Mons, RN 11/04/2016, 10:35 AM

## 2016-11-04 NOTE — Progress Notes (Signed)
Patient was discharged home with home health by MD order; discharged instructions  review and give to patient and his wife with care notes; IV DIC; patient will be escorted to the car by a volunteer via wheelchair.

## 2016-11-04 NOTE — Progress Notes (Addendum)
[facility]  Orthostatic vitals negative. Asked tech and RN to walk patient. Per RN he walked in the hall with a walked and did well. Stable for d/c today. See my updated d/c summary from 11/02/16.  Debbe Odea, MD

## 2016-11-04 NOTE — Progress Notes (Signed)
Physical Therapy Treatment Patient Details Name: Spencer Howard MRN: [id number] DOB: 12/11/1928 Today's Date: 11/04/2016    History of Present Illness Patient is a 83 y/o male who presents with syncopal episode and fall. s/p left hand fx. PMH includes ETOH use, dementia assoicated with ETOH, HLD, HTN, hypothyroidism and hx of tobacco use.     PT Comments    Pt continues with LE tremors during gait, wife states this is new.  PT discussed with pt/wife and both are agreeable to pt being w/c level at home to reduce risk of falls and to have pt continue gait training with HHPT.   Follow Up Recommendations  Home health PT;Supervision/Assistance - 24 hour     Equipment Recommendations  Wheelchair (measurements PT);Wheelchair cushion (measurements PT) [phone])    Recommendations for Other Services       Precautions / Restrictions Precautions Precautions: Fall Precaution Comments: watch BP Restrictions Weight Bearing Restrictions: No Other Position/Activity Restrictions: Left hand wrapped in splint- no orders for WB restrictions/precautions in chart.    Mobility  Bed Mobility Overal bed mobility: Needs Assistance Bed Mobility: Supine to Sit     Supine to sit: Supervision     General bed mobility comments: cues to sit EOB and then stand momentarily due to hx of orthostasis  Transfers Overall transfer level: Needs assistance Equipment used: Rolling walker (2 wheeled) Transfers: Sit to/from Stand Sit to Stand: Min assist         General transfer comment: min assist for balance, cues for hand placement  Ambulation/Gait Ambulation/Gait assistance: Min assist Ambulation Distance (Feet): 200 Feet Assistive device: Rolling walker (2 wheeled);None       General Gait Details: continued tremors in LEs during gait, needs min A especially with turns due to LOB, cues for safety with RW   Stairs            Wheelchair Mobility    Modified Rankin (Stroke Patients Only)        Balance     Sitting balance-Leahy Scale: Good       Standing balance-Leahy Scale: Poor Standing balance comment: min A for standing with 1 UE support                            Cognition Arousal/Alertness: Awake/alert Behavior During Therapy: Impulsive Overall Cognitive Status: History of cognitive impairments - at baseline                       Memory: Decreased short-term memory Following Commands: Follows one step commands with increased time Safety/Judgement: Decreased awareness of safety;Decreased awareness of deficits     General Comments: pt with dementia at baseline. Repeating self during session.      Exercises      General Comments        Pertinent Vitals/Pain Pain Assessment: No/denies pain    Home Living                      Prior Function            PT Goals (current goals can now be found in the care plan section) Acute Rehab PT Goals Patient Stated Goal: to play golf again Time For Goal Achievement: 11/15/16 Progress towards PT goals: Progressing toward goals    Frequency    Min 3X/week      PT Plan Current plan remains appropriate    Co-evaluation  AM-PAC PT "6 Clicks" Daily Activity  Outcome Measure  Difficulty turning over in bed (including adjusting bedclothes, sheets and blankets)?: None Difficulty moving from lying on back to sitting on the side of the bed? : None Difficulty sitting down on and standing up from a chair with arms (e.g., wheelchair, bedside commode, etc,.)?: A Little   Help needed walking in hospital room?: A Little Help needed climbing 3-5 steps with a railing? : A Lot 6 Click Score: 16    End of Session Equipment Utilized During Treatment: Gait belt Activity Tolerance: Patient tolerated treatment well Patient left: with family/visitor present;in chair   PT Visit Diagnosis: Unsteadiness on feet (R26.81);Difficulty in walking, not elsewhere classified  (R26.2);Muscle weakness (generalized) (M62.81)     Time: 1145-1200 PT Time Calculation (min) (ACUTE ONLY): 15 min  Charges:  $Gait Training: 8-22 mins                    G Codes:       Isabelle Course, PT, DPT   Silver Parkey 11/04/2016, 12:05 PM

## 2016-11-04 NOTE — Progress Notes (Signed)
Occupational Therapy Treatment Patient Details Name: Spencer Howard MRN: [id number] DOB: 1929-02-22 Today's Date: 11/04/2016    History of present illness Patient is a 83 y/o male who presents with syncopal episode and fall. s/p left hand fx. PMH includes ETOH use, dementia assoicated with ETOH, HLD, HTN, hypothyroidism and hx of tobacco use.    OT comments  Pt much more alert and interactive today. Performed sponge bathing, grooming and dressing seated and sink with wife observing. Wife believes she is able to manage pt at home and is able to verbalize that pt needs 24 hour close supervision upon discharge. Home health continues to be recommended. Reinforced elevating L UE as pt has moderate edema.  Follow Up Recommendations  Home health OT;Supervision/Assistance - 24 hour    Equipment Recommendations       Recommendations for Other Services      Precautions / Restrictions Precautions Precautions: Fall Precaution Comments: watch BP Restrictions Weight Bearing Restrictions: No Other Position/Activity Restrictions: Left hand wrapped in splint- no orders for WB restrictions/precautions in chart.       Mobility Bed Mobility Overal bed mobility: Needs Assistance Bed Mobility: Supine to Sit     Supine to sit: Supervision     General bed mobility comments: cues to sit EOB and then stand momentarily due to hx of orthostasis  Transfers Overall transfer level: Needs assistance Equipment used: Rolling walker (2 wheeled) Transfers: Sit to/from Stand Sit to Stand: Min assist         General transfer comment: min assist for balance, cues for hand placement    Balance     Sitting balance-Leahy Scale: Good       Standing balance-Leahy Scale: Poor Standing balance comment: requires one hand on sink while performing pericare                           ADL either performed or assessed with clinical judgement   ADL Overall ADL's : Needs  assistance/impaired Eating/Feeding: Minimal assistance;Sitting   Grooming: Wash/dry hands;Wash/dry face;Minimal assistance;Sitting;Brushing hair   Upper Body Bathing: Moderate assistance;Sitting   Lower Body Bathing: Moderate assistance;Sit to/from stand   Upper Body Dressing : Minimal assistance;Sitting   Lower Body Dressing: Moderate assistance;Sit to/from stand   Toilet Transfer: Minimal assistance;Ambulation;RW   Toileting- Clothing Manipulation and Hygiene: Sit to/from stand;Moderate assistance       Functional mobility during ADLs: Minimal assistance;Cueing for safety;Rolling walker General ADL Comments: Completed sponge bathing and dressing at sink, wife observing and is aware of level of assist and constant supervision pt needs.     Vision       Perception     Praxis      Cognition Arousal/Alertness: Awake/alert Behavior During Therapy: Impulsive Overall Cognitive Status: History of cognitive impairments - at baseline                                 General Comments: pt with dementia at baseline. Repeating self during session.        Exercises     Shoulder Instructions       General Comments      Pertinent Vitals/ Pain       Pain Assessment: No/denies pain  Home Living  Prior Functioning/Environment              Frequency  Min 2X/week        Progress Toward Goals  OT Goals(current goals can now be found in the care plan section)  Progress towards OT goals: Progressing toward goals  Acute Rehab OT Goals Patient Stated Goal: to play golf again OT Goal Formulation: With patient Time For Goal Achievement: 11/17/16 Potential to Achieve Goals: Good  Plan Discharge plan remains appropriate    Co-evaluation                 AM-PAC PT "6 Clicks" Daily Activity     Outcome Measure   Help from another person eating meals?: A Little Help from another person  taking care of personal grooming?: A Little Help from another person toileting, which includes using toliet, bedpan, or urinal?: A Lot Help from another person bathing (including washing, rinsing, drying)?: A Lot Help from another person to put on and taking off regular upper body clothing?: A Little Help from another person to put on and taking off regular lower body clothing?: A Lot 6 Click Score: 15    End of Session Equipment Utilized During Treatment: Gait belt;Rolling walker  OT Visit Diagnosis: Unsteadiness on feet (R26.81);Muscle weakness (generalized) (M62.81);Pain;History of falling (Z91.81);Other abnormalities of gait and mobility (R26.89);Other symptoms and signs involving cognitive function   Activity Tolerance Patient tolerated treatment well   Patient Left in chair;with call bell/phone within reach;with family/visitor present   Nurse Communication          Time: 4709-6283 OT Time Calculation (min): 32 min  Charges: OT General Charges $OT Visit: 1 Procedure OT Treatments $Self Care/Home Management : 23-37 mins     Malka So 11/04/2016, 10:24 AM  [phone]

## 2016-11-04 NOTE — Care Management Important Message (Signed)
Important Message  Patient Details  Name: Spencer Howard MRN: [id number] Date of Birth: 29-Dec-1928   Medicare Important Message Given:  Yes    Nathen May 11/04/2016, 9:43 AM

## 2016-11-11 DIAGNOSIS — Z23 Encounter for immunization: Secondary | ICD-10-CM | POA: Diagnosis not present

## 2016-11-11 DIAGNOSIS — I951 Orthostatic hypotension: Secondary | ICD-10-CM | POA: Diagnosis not present

## 2016-11-11 DIAGNOSIS — R8299 Other abnormal findings in urine: Secondary | ICD-10-CM | POA: Diagnosis not present

## 2016-11-11 DIAGNOSIS — E038 Other specified hypothyroidism: Secondary | ICD-10-CM | POA: Diagnosis not present

## 2016-11-14 DIAGNOSIS — S62327A Displaced fracture of shaft of fifth metacarpal bone, left hand, initial encounter for closed fracture: Secondary | ICD-10-CM | POA: Diagnosis not present

## 2016-11-14 DIAGNOSIS — S62325A Displaced fracture of shaft of fourth metacarpal bone, left hand, initial encounter for closed fracture: Secondary | ICD-10-CM | POA: Diagnosis not present

## 2016-11-15 DIAGNOSIS — F959 Tic disorder, unspecified: Secondary | ICD-10-CM | POA: Diagnosis not present

## 2016-11-15 DIAGNOSIS — F0634 Mood disorder due to known physiological condition with mixed features: Secondary | ICD-10-CM | POA: Diagnosis not present

## 2016-11-15 DIAGNOSIS — G3184 Mild cognitive impairment, so stated: Secondary | ICD-10-CM | POA: Diagnosis not present

## 2016-11-17 NOTE — Progress Notes (Signed)
Cardiology Office Note    Date:  11/18/2016   ID:  Spencer Howard, DOB 01/28/1929, MRN [id number]  PCP:  Spencer Pao, MD  Cardiologist: Dr. Ellyn Howard  Chief Complaint  Patient presents with  . Hospitalization Follow-up    History of Present Illness:    Spencer Howard is a 83 y.o. male with past medical history of orthostatic hypotension, HTN, HLD, and dementia who presents to the office today for hospital follow-up.   He was recently admitted to [facility] from 8/6 - 11/04/2016 for evaluation of a syncopal event. This occurred while he was at his PCP's office and he supposedly lost pulses for a few seconds but never required CPR. Upon arrival to the ED, his Head CT showed no acute intracranial abnormalities. He was found to be orthostatic with BP dropping from 133/46 while lying to 78/41 with standing. Cardiology was consulted and favored conservative measures with increased salt intake and compression stockings. With his elevated resting BP, he was not started on Midodrine or Florinef by Cardiology but he was started on Midodrine 10mg  TID by the admitting team due to recurrent episodes of dizziness throughout his hospitalization. Amlodipine was added back to assist with his resting BP.  In talking with the patient today, most history is provided by his wife as he has dementia at baseline. She reports that he has experienced significant improvement in his dizziness since starting Midodrine and is overall feeling well. He has been more active due to not feeling like he is going to fall. The main side effect that she has noticed is nocturnal incontinence as she is having to change his bed sheets multiple times during the night. This is all new since his hospitalization according to his wife. No painful urination or hematuria. He has been wearing compression stockings on a daily basis.   He denies any recent chest pain, dyspnea on exertion, orthopnea, PND or lower extremity edema. No recurrent  syncopal events. Home Health PT works with the patient 3-4 days per week and BP has been well-controlled in the 110's - 140's/80's. They have rechecked orthostatics and his wife reports his SBP drops by 10-15 points with standing but is he asymptomatic with this.    Past Medical History:  Diagnosis Date  . Alcohol abuse with alcohol-induced mental disorder ([place])   . Dementia associated with alcoholism ([place])    Alzheimer's dementia versus alcohol related dementia. 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 Depression   . High cholesterol   . Hypertension   . Hypothyroidism (acquired)     Past Surgical History:  Procedure Laterality Date  . ELBOW SURGERY Left 1995   Broken  . NM MYOVIEW LTD  02/2016   Normal LV function - EF 60-65%. No EKG changes. No ischemia or infarction noted on imaging. LOW RISK  . TONSILLECTOMY  1942  . TRANSTHORACIC ECHOCARDIOGRAM  02/2016   Moderate concentric LVH with EF 55-60%. GR 1 DD. Mild LA dilation. Mildly increased PA pressures  . VARICOCELECTOMY  1967    Current Medications: Outpatient Medications Prior to Visit  Medication Sig Dispense Refill  . alendronate (FOSAMAX) 70 MG tablet Take 70 mg by mouth once a week.     Marland Kitchen amLODipine (NORVASC) 10 MG tablet Take 1 tablet (10 mg total) by mouth daily. 30 tablet 0  . aspirin 81 MG tablet Take 81 mg by mouth daily.    Marland Kitchen atorvastatin (LIPITOR) 10 MG tablet Take 10 mg by mouth  daily.    . calcium carbonate (OSCAL) 1500 (600 CA) MG TABS tablet Take 1,500 mg by mouth daily.    . Cholecalciferol (VITAMIN D3) 10000 UNITS TABS Take 1 tablet by mouth daily.    Marland Kitchen donepezil (ARICEPT) 10 MG tablet Take 1 tablet (10 mg total) by mouth 2 (two) times daily. 180 tablet 4  . levothyroxine (SYNTHROID, LEVOTHROID) 50 MCG tablet Take 50 mcg by mouth daily before breakfast.    . methylphenidate (RITALIN) 10 MG tablet Take 10 mg by mouth every morning.  0  . Multiple Vitamins-Minerals  (MULTIVITAMIN ADULT PO) Take 1 tablet by mouth daily.    Marland Kitchen PARoxetine (PAXIL) 10 MG tablet Take 10 mg by mouth at bedtime.    . midodrine (PROAMATINE) 10 MG tablet Take 1 tablet (10 mg total) by mouth 3 (three) times daily with meals. 90 tablet 0   No facility-administered medications prior to visit.      Allergies:   Patient has no known allergies.   Social History   Social History  . Marital status: Married    Spouse name: Spencer Howard  . Number of children: 2  . Years of education: 9   Occupational History  . Retired     Retired Transport planner. [employer] and then worked for [employer]  . Smoking status: Former Smoker    Quit date: 03/28/1969  . Smokeless tobacco: Never Used  . Alcohol use 0.0 oz/week     Comment: 4-6 oz wine  . Drug use: No  . Sexual activity: Not Asked   Other Topics Concern  . None   Social History Narrative   Married father of 2, grandfather of 78, great grandfather of 64. He is married for 50 years.   Lives at home with wife.    Caffeine use: 1-2 cups per day   Drinks maybe 25-30 glasses of wine a week.   No routine exercise.   He grew up in [place] [employer] right-handed, throws left-handed     Family History:  The patient's family history includes Colon cancer in his mother; Leukemia in his father.   Review of Systems:   Please see the history of present illness.     General:  No chills, fever, night sweats or weight changes.  Cardiovascular:  No chest pain, dyspnea on exertion, edema, orthopnea, palpitations, paroxysmal nocturnal dyspnea. Dermatological: No rash, lesions/masses Respiratory: No cough, dyspnea Urologic: No hematuria. Positive for frequent urination.  Abdominal:   No nausea, vomiting, diarrhea, bright red blood per rectum, melena, or hematemesis Neurologic:  No visual changes or wkns. Positive for changes in mental status.  All other systems reviewed and are otherwise negative except as noted  above.   Physical Exam:    VS:  BP 132/68   Pulse 82   Ht 5\' 11"  (1.803 m)   Wt 191 lb (86.6 kg)   BMI 26.64 kg/m    General: Well developed, well nourished Caucasian male appearing in no acute distress. Head: Normocephalic, atraumatic, sclera non-icteric, no xanthomas, nares are without discharge.  Neck: No carotid bruits. JVD not elevated.  Lungs: Respirations regular and unlabored, without wheezes or rales.  Heart: Regular rate and rhythm. No S3 or S4.  No murmur, no rubs, or gallops appreciated. Abdomen: Soft, non-tender, non-distended with normoactive bowel sounds. No hepatomegaly. No rebound/guarding. No obvious abdominal masses. Msk:  Strength and tone appear normal for age. No joint deformities or effusions. Extremities: No clubbing  or cyanosis. No lower extremity edema.  Distal pedal pulses are 2+ bilaterally. Compression stockings in place.  Neuro: Alert and oriented X 3. Moves all extremities spontaneously. No focal deficits noted. Psych:  Responds to questions appropriately with a normal affect. Skin: No rashes or lesions noted  Wt Readings from Last 3 Encounters:  11/18/16 191 lb (86.6 kg)  11/04/16 200 lb (90.7 kg)  05/16/16 195 lb 3.2 oz (88.5 kg)    Studies/Labs Reviewed:   EKG:  EKG is not ordered today.   Recent Labs: 10/31/2016: BUN 17; Creatinine, Ser 1.06; Hemoglobin 12.7; Platelets 220; Potassium 4.1; Sodium 139 11/01/2016: TSH 4.804   Lipid Panel No results found for: CHOL, TRIG, HDL, CHOLHDL, VLDL, LDLCALC, LDLDIRECT  Additional studies/ records that were reviewed today include:   Echocardiogram: 02/2016 Study Conclusions  - Left ventricle: There was moderate concentric hypertrophy.   Systolic function was normal. The estimated ejection fraction was   in the range of 55% to 60%. Wall motion was normal; there were no   regional wall motion abnormalities. Doppler parameters are   consistent with abnormal left ventricular relaxation (grade 1    diastolic dysfunction). Doppler parameters are consistent with   elevated ventricular end-diastolic filling pressure. - Aortic valve: There was trivial regurgitation. - Mitral valve: Calcified annulus. - Left atrium: The atrium was mildly dilated. - Right ventricle: The cavity size was mildly dilated. - Pulmonary arteries: Systolic pressure was mildly increased. PA   peak pressure: 36 mm Hg (S).  NST: 02/2016  Nuclear stress EF: 63%.  The left ventricular ejection fraction is normal (55-65%).  There was no ST segment deviation noted during stress.  The study is normal.  This is a low risk study.    Finalized by Skeet Latch, MD on Fri Mar 11, 2016 5:41 PM   Nuclear stress EF: 63%.  The left ventricular ejection fraction is normal (55-65%).     Assessment:    1. Orthostatic hypotension   2. Essential hypertension   3. Mixed hyperlipidemia   4. Dementia without behavioral disturbance, unspecified dementia type   5. Hypothyroidism, unspecified type      Plan:   In order of problems listed above:  1. Orthostatic Hypotension - recently admitted for syncope and found to have significant orthostatic hypotension with BP dropping from 133/46 while lying to 78/41 with standing. Cardiology was consulted and favored conservative measures but he continued to experience significant dizziness and orthostasis, therefore he was started on Midodrine 10mg  TID by the admitting team along with being continued on Amlodipine to assist with resting BP.  - the patient's dizziness has substantially improved since his hospitalization and he is more active at baseline due to not feeling like he is going to fall. No recurrent syncope. The most noted side effect his wife reports is he experiences multiple episodes of nocturnal incontinence which is new since starting the medication and is influencing his sleep habits. No painful urination or hematuria.  - we reviewed that conservative measures  are overall favored with his resting HTN including compression stockings, adequate fluid intake, and liberalized salt intake which he has been doing. She has been so impressed with the improvement in his dizziness, that she wishes for him to continue on a medication to assist for this if possible. Will stop Midodrine and initiate low-dose Florinef. May need to further titrate pending his response to the medication. They will continue to follow BP closely. If he does not tolerate Florinef, could try using  Midodrine only twice daily to see if this assists with the side effect of urination.   2. HTN - BP while sitting is at 132/68 during today's visit.  - remains on Amlodipine 10mg  daily with medication changes as outlined above. Will continue to follow BP closely at home.   3. HLD - followed by his PCP.  - remains on Atorvastatin 10mg  daily.   4. Dementia - only A&Ox1 (person). History provided by the patient's wife. - followed by Neurology.   5. Hypothyroidism - TSH slightly elevated to 4.804 during his recent hospitalization. Encouraged to follow-up with PCP for further evaluation as he is currently on Synthroid 50 mcg daily.    Medication Adjustments/Labs and Tests Ordered: Current medicines are reviewed at length with the patient today.  Concerns regarding medicines are outlined above.  Medication changes, Labs and Tests ordered today are listed in the Patient Instructions below. Patient Instructions  Medication Instructions: STOP the midodrine START Florinef 0.1 mg tablet daily  If you need a refill on your cardiac medications before your next appointment, please call your pharmacy.   Follow-Up: Your physician wants you to follow-up in: 3 months with Dr. Ellyn Howard. You will receive a reminder letter in the mail two months in advance. If you don't receive a letter, please call our office to schedule this follow-up appointment.  Special Instructions:  Please check your blood pressure  2-3 times weekly. Call the office if symptoms persist.  Thank you for choosing [facility] at [facility]!!      Signed, Erma Heritage, PA-C  11/18/2016 9:33 AM    [address], [place], [place]  [address] Phone: [phone]; Fax: [phone]  [address] [place], [place] [address] Phone: [phone]

## 2016-11-18 ENCOUNTER — Encounter: Payer: Self-pay | Admitting: Student

## 2016-11-18 ENCOUNTER — Ambulatory Visit (INDEPENDENT_AMBULATORY_CARE_PROVIDER_SITE_OTHER): Payer: Medicare Other | Admitting: Student

## 2016-11-18 VITALS — BP 132/68 | HR 82 | Ht 71.0 in | Wt 191.0 lb

## 2016-11-18 DIAGNOSIS — E039 Hypothyroidism, unspecified: Secondary | ICD-10-CM | POA: Diagnosis not present

## 2016-11-18 DIAGNOSIS — I10 Essential (primary) hypertension: Secondary | ICD-10-CM

## 2016-11-18 DIAGNOSIS — F0390 Unspecified dementia without behavioral disturbance: Secondary | ICD-10-CM

## 2016-11-18 DIAGNOSIS — E782 Mixed hyperlipidemia: Secondary | ICD-10-CM

## 2016-11-18 DIAGNOSIS — I951 Orthostatic hypotension: Secondary | ICD-10-CM

## 2016-11-18 MED ORDER — FLUDROCORTISONE ACETATE 0.1 MG PO TABS: 0.1000 mg | ORAL_TABLET | Freq: Every day | ORAL | 3 refills | Status: DC

## 2016-11-18 NOTE — Patient Instructions (Signed)
Medication Instructions: STOP the midodrine START Florinef 0.1 mg tablet daily  If you need a refill on your cardiac medications before your next appointment, please call your pharmacy.   Follow-Up: Your physician wants you to follow-up in: 3 months with Dr. Ellyn Hack or Bernerd Pho, Ranger will receive a reminder letter in the mail two months in advance. If you don't receive a letter, please call our office to schedule this follow-up appointment.   Special Instructions:  Please check your blood pressure 2-3 times weekly. Call the office if symptoms persist.  Thank you for choosing [facility] at [facility]!!

## 2016-11-22 ENCOUNTER — Ambulatory Visit: Payer: Medicare Other | Admitting: Neurology

## 2016-11-29 ENCOUNTER — Encounter: Payer: Self-pay | Admitting: Neurology

## 2016-11-29 ENCOUNTER — Ambulatory Visit (INDEPENDENT_AMBULATORY_CARE_PROVIDER_SITE_OTHER): Payer: Medicare Other | Admitting: Neurology

## 2016-11-29 VITALS — BP 153/66 | HR 94 | Ht 71.0 in | Wt 192.0 lb

## 2016-11-29 DIAGNOSIS — G2581 Restless legs syndrome: Secondary | ICD-10-CM

## 2016-11-29 DIAGNOSIS — G2571 Drug induced akathisia: Secondary | ICD-10-CM

## 2016-11-29 DIAGNOSIS — R451 Restlessness and agitation: Secondary | ICD-10-CM | POA: Diagnosis not present

## 2016-11-29 DIAGNOSIS — R339 Retention of urine, unspecified: Secondary | ICD-10-CM

## 2016-11-29 DIAGNOSIS — R39198 Other difficulties with micturition: Secondary | ICD-10-CM

## 2016-11-29 MED ORDER — MIDODRINE HCL 10 MG PO TABS: 10.0000 mg | ORAL_TABLET | Freq: Three times a day (TID) | ORAL | 11 refills | Status: DC

## 2016-11-29 NOTE — Patient Instructions (Addendum)
Remember to drink plenty of fluid, eat healthy meals and do not skip any meals. Try to eat protein with a every meal and eat a healthy snack such as fruit or nuts in between meals. Try to keep a regular sleep-wake schedule and try to exercise daily, particularly in the form of walking, 20-30 minutes a day, if you can.   As far as your medications are concerned, I would like to suggest: Stop Fluorinef, start Midodrine Urology consult Follow with [name]  As far as diagnostic testing: Labs  I would like to see you back in 6 months, sooner if we need to. Please call us with any interim questions, concerns, problems, updates or refill requests.    Our phone number is [phone]. We also have an after hours call service for urgent matters and there is a physician on-call for urgent questions. For any emergencies you know to call 911 or go to the nearest emergency room

## 2016-11-29 NOTE — Progress Notes (Signed)
[facility]    Provider:  Dr Jaynee Eagles Referring Provider: Osborne Casco Fransico Him, MD Primary Care Physician:  Haywood Pao, MD  CC: Memory loss  Interval history 11/29/2016: Was taking flomax but had to stop due to blood pressure concerns and since then having difficulty urinating and urinating overnight in the bed.  Memory is stable, not significantly worse. PT at home is helping. He feels he sleeps well. No snoring. He sleeps at regular hours and gets 8 hours of sleep. Following with Dr. [name] in psychiatry. Ritalin helps with the sleepiness.    Interval history 05/16/2016: Patient returns for follow up. 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 Had a long discussion regarding MCI, causes and risks for progression to Alzheimers and other dementias. He has had 1-2 falls since appointment, worsening gait. He went to PT but he is not doing the recommended exercises at home. Falls, worsened cognition and imbalance, can;t leave the house alone, he is not driving anymore.  They have several issues to discuss today:  Drinking for the last year: he drinks a 711ml bottle of wine every 2 days. Been ongoing. Recommend max 2 glasses a day. However stopping abruptly can cause withdrawal and risk of death. F/u with Dr. Osborne Casco  Inactivity and imbalance and falls: encouraged activity, silver sneakers. Home nursing and PT at home.  Self mutilation, extreme agitation, depression, death of daughter: Recommend psychiatry Dr. [name] at [facility], he needs medical management but they also need family counseling for his MCI likely of Alzheimer's type or depression or both.   Driving; recommend no driving  Incontinent: f/u with Dr. Osborne Casco  F/u Dr. Vikki Ports for review of neuropsych testing   Interval History 07/29/2015: Short term memory is worsening. He is highly dependent on his watch. He forgets relationships  of family members and grandchildren and great grandchildren. He is having balance problems, he staggers a lot, near falls, mostly in th emorning when he gets up, he gets up too fast and his blood pressure is low and gets dizzy. No numbness or tingling in the feet. Had recent bloodwork witll request from Dr. Osborne Casco.   HPI: Stokes Rattigan is a 83 y.o. male here as a referral from Dr. Osborne Casco for for dementia. PMHx memory loss, HTN, HLD, depression, hypothyroidism. Per a review of records, He has been experiencing short-term memory loss. Wife here with patient and provides information. Patient does not recall conversations, repeatedly asks questions, forgets appointments and needs reminding every day of daily plans. He forgets to take his medications. He gets lost when he drives. He uses GPS now and wife is always with him. [place] got [place] the golf [place]. Wife manages the finances. Symptoms started 3 years ago and slow progression. He did experience significant depression after the loss of his daughter. A component of depression/anxiety was suspected. As such, a complete neuropsychological evaluation including labs and brain imaging was ordered to differentiate depression from a neurodegenerative disorder. Brain imaging and blood work were unrevealing and the MRI of the brain showed only "stable chronic small vessel changes". He was placed on Lexapro for depression. 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 A thorough neuropsychological evaluation was performed and mild cognitive impairment was diagnosed and not suggestive of dementia at this time. He is on Aricept. Further, the psychological issues is not the cause of his cognitive difficulties however it affects his quality of life and exacerbates  existing cognitive deficits.  He just moved into the area and is here to establish care. He is using [employer]. He is a very pleasant and conversant gentleman here with his wife who provides  just as much information as patient does. He says he sometimes asks for directions. He moved and this has been stressful but they are thrilled to be here around family. Memory changes started 3 years ago. It has been very slowly progressive. More recent than remote memories. He does not know the plans for the day, he says the same things over and over again, asks the same questions over and over. He gets frutrated and swears more but this is not a change in personality, just a little worse than he has been in the past. He remebers in the 7th grade he used to get checks for self control. He is better with his depression. His pcp is managing his depression, he is better on the Lexapro. The other day he got really mad but again he has done this in the past. One glass of wine a day. He has some bouts of frustration or anger. He denies inappropriate bouts of crying or laughing or other pseudobulbar symptoms. He does get angry but this is not something new.   Reviewed notes, labs and imaging from outside physicians, which showed: Neuropsychological evaluation was performed in October 2016. [name] profile of findings appears to be best characterized as mild cognitive impairment, amnestic type. He demonstrated mild difficulty with simple attention and performance on measures of psychomotor speed was variable. He was able to understand directions for most measures but he did have trouble comprehending instructions for some of the more complicated tasks. He did not demonstrate the disorientation, deficient abstract reasoning, concrete clock drawing, impaired categorical fluency, reduced judgment and poor mental flexibility often observed in people with cortical dementia. And although he experiences memory lapses and his uvula, he is not demonstrating generalized functional impairment. This despite his memory decline Mr. Soffer does not meet full criteria for cortical dementia i.e. Alzheimer's disease at this time.  However Mr. Johnston demonstrated greater memory loss that is typical for a person of his age. Small vessel ischemia would not cause the circumscribed memory deficits demonstrated by the patient. 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 Repeat neuropsychological assessment in 12 months is strongly recommended to assist with differential diagnosis. This is particularly important given that amnestic mild cognitive impairment is increasingly thought to represent the early stages of dementia. Worsening of cognitive function in the context of stable cerebrovascular and overall health would provide strong evidence of a developing dementia.   Review of Systems: Patient complains of symptoms per HPI as well as the following symptoms: agitation. Pertinent negatives and positives per HPI. All others negative.   Social History   Social History  . Marital status: Married    Spouse name: susan  . Number of children: 2  . Years of education: 2   Occupational History  . Retired     Retired Transport planner. [employer] and then worked for [employer]  . Smoking status: Former Smoker    Quit date: 03/28/1969  . Smokeless tobacco: Never Used  . Alcohol use 0.0 oz/week     Comment: 4-6 oz wine  . Drug use: No  . Sexual activity: Not on file   Other Topics Concern  . Not on file   Social History  Narrative   Married father of 2, grandfather of 73, great grandfather of 61. He is married for 50 years.   Lives at home with wife.    Caffeine use: 1-2 cups per day   Drinks maybe 25-30 glasses of wine a week.   No routine exercise.   He grew up in [place] near [employer] right-handed, throws left-handed    Family History  Problem Relation Age of Onset  . Leukemia Father   . Colon cancer Mother   . Dementia Neg Hx     Past Medical History:  Diagnosis Date  . Alcohol abuse with alcohol-induced mental  disorder ([place])   . Dementia associated with alcoholism ([place])    Alzheimer's dementia versus alcohol related dementia. 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 Depression   . High cholesterol   . Hypertension   . Hypothyroidism (acquired)     Past Surgical History:  Procedure Laterality Date  . ELBOW SURGERY Left 1995   Broken  . NM MYOVIEW LTD  02/2016   Normal LV function - EF 60-65%. No EKG changes. No ischemia or infarction noted on imaging. LOW RISK  . TONSILLECTOMY  1942  . TRANSTHORACIC ECHOCARDIOGRAM  02/2016   Moderate concentric LVH with EF 55-60%. GR 1 DD. Mild LA dilation. Mildly increased PA pressures  . VARICOCELECTOMY  1967    Current Outpatient Prescriptions  Medication Sig Dispense Refill  . Acetylcysteine (NAC 600 PO) Take 1 tablet by mouth daily.    Marland Kitchen alendronate (FOSAMAX) 70 MG tablet Take 70 mg by mouth once a week.     Marland Kitchen amLODipine (NORVASC) 10 MG tablet Take 1 tablet (10 mg total) by mouth daily. 30 tablet 0  . aspirin 81 MG tablet Take 81 mg by mouth daily.    Marland Kitchen atorvastatin (LIPITOR) 10 MG tablet Take 10 mg by mouth daily.    . Calcium Citrate-Vitamin D (CALCIUM CITRATE + D3 PO) Take 1 tablet by mouth daily.    . Cholecalciferol (VITAMIN D3) 10000 UNITS TABS Take 1 tablet by mouth daily.    Marland Kitchen donepezil (ARICEPT) 10 MG tablet Take 1 tablet (10 mg total) by mouth 2 (two) times daily. 180 tablet 4  . levothyroxine (SYNTHROID, LEVOTHROID) 50 MCG tablet Take 50 mcg by mouth daily before breakfast.    . methylphenidate (RITALIN) 10 MG tablet Take 10 mg by mouth every morning.  0  . Multiple Vitamins-Minerals (MULTIVITAMIN ADULT PO) Take 1 tablet by mouth daily.    Marland Kitchen PARoxetine (PAXIL) 10 MG tablet Take 10 mg by mouth at bedtime.    . midodrine (PROAMATINE) 10 MG tablet Take 1 tablet (10 mg total) by mouth 3 (three) times daily. 90 tablet 11   No current facility-administered medications for this visit.     Allergies as of  11/29/2016  . (No Known Allergies)    Vitals: BP (!) 153/66   Pulse 94   Ht 5\' 11"  (1.803 m)   Wt 192 lb (87.1 kg)   BMI 26.78 kg/m  Last Weight:  Wt Readings from Last 1 Encounters:  11/29/16 192 lb (87.1 kg)   Last Height:   Ht Readings from Last 1 Encounters:  11/29/16 5\' 11"  (1.803 m)   MMSE - Mini Mental State Exam 11/29/2016 05/16/2016 07/29/2015  Orientation to time 1 1 5   Orientation to Place 3 3 2   Registration 3 3 3   Attention/ Calculation 4 5 5   Recall 3 2 3  Language- name 2 objects 2 2 2   Language- repeat 0 0 0  Language- follow 3 step command 2 3 3   Language- read & follow direction 1 1 1   Write a sentence 1 1 1   Copy design 1 1 1   Total score 21 22 26       Cranial Nerves:  The pupils are equal, round, and reactive to light. The fundi are normal and spontaneous venous pulsations are present. Visual fields are full to finger confrontation. Extraocular movements are intact. Trigeminal sensation is intact and the muscles of mastication are normal. The face is symmetric. The palate elevates in the midline. Hearing intact. Voice is normal. Shoulder shrug is normal. The tongue has normal motion without fasciculations.   Coordination:  Normal finger to nose and heel to shin. Normal rapid alternating movements.   Gait:  Stands with imbalance  Motor Observation:  No asymmetry, no atrophy, and no involuntary movements noted. Tone:  Normal muscle tone.   Posture:  Posture is normal. normal erect   Strength:  Strength is 4/5 in the upper and lower limbs.    Sensation: intact to LT       Assessment/Plan: 83 year old male here with MCI amnestic type possibly Alzheimer's prodrome or depression or both, unclear. Neuro exam with imbalance, recent falls. Extensive workup from previous neurologist and neuropsychological testing c/w MCI complicated by depression.Here with wife of 17 years. Continues to progress, recent neuropsych  testing confirms cognitive impairment  - Urology consult for urinary frequency, incontinence and BPH -Cognitive impairment: Continue Aricept - Drinking for the last year: he drinks a 740ml bottle of wine every 2 days. Been ongoing. Recommend max 2 glasses a day. However stopping abruptly can cause withdrawal and risk of death. F/u with Dr. Osborne Casco - Inactivity and imbalance and falls: encouraged activity, silver sneakers. Home nursing and PT at home. - Self mutilation, extreme agitation, depression, death of daughter: Recommend psychiatry Dr. [name] at [facility], he needs medical management but they also need family counseling for his MCI likely of Alzheimer's type vs depression or both.  - Driving; recommend no driving - Discussed agitation and akathisia, provided a list of medications that can cause this - restlessness: check ferritin  B12 collected 06/15/2015 678, CMP unremarkable except creatinine 1.3, TSH 3.13 normal  Labs 11/11/2016: CMP: BUN 22, creatinine 1.2, MCV elevated at 11 otherwise unremarkable. TSH normal.  Orders Placed This Encounter  Procedures  . CBC  . Basic Metabolic Panel  . Iron, TIBC and Ferritin Panel  . Ambulatory referral to Urology     Sarina Ill, MD  [facility] [address] [place], [place] [address]  Phone [phone] Fax [phone]  A total of 25 minutes was spent face-to-face with this patient. Over half this time was spent on counseling patient on the MCI, agitation, urinary dysfunction diagnosis and different diagnostic and therapeutic options available.

## 2016-11-30 ENCOUNTER — Other Ambulatory Visit: Payer: Self-pay | Admitting: Neurology

## 2016-11-30 ENCOUNTER — Telehealth: Payer: Self-pay

## 2016-11-30 DIAGNOSIS — G2571 Drug induced akathisia: Secondary | ICD-10-CM

## 2016-11-30 DIAGNOSIS — E538 Deficiency of other specified B group vitamins: Secondary | ICD-10-CM

## 2016-11-30 DIAGNOSIS — R339 Retention of urine, unspecified: Secondary | ICD-10-CM

## 2016-11-30 DIAGNOSIS — D519 Vitamin B12 deficiency anemia, unspecified: Secondary | ICD-10-CM

## 2016-11-30 DIAGNOSIS — G2581 Restless legs syndrome: Secondary | ICD-10-CM

## 2016-11-30 LAB — BASIC METABOLIC PANEL
BUN/Creatinine Ratio: 20 (ref 10–24)
BUN: 21 mg/dL (ref 8–27)
CALCIUM: 8.9 mg/dL (ref 8.6–10.2)
CO2: 27 mmol/L (ref 20–29)
CREATININE: 1.07 mg/dL (ref 0.76–1.27)
Chloride: 100 mmol/L (ref 96–106)
GFR, EST AFRICAN AMERICAN: 73 mL/min/{1.73_m2} (ref >59)
GFR, EST NON AFRICAN AMERICAN: 63 mL/min/{1.73_m2} (ref >59)
Glucose: 88 mg/dL (ref 65–99)
POTASSIUM: 4.5 mmol/L (ref 3.5–5.2)
Sodium: 140 mmol/L (ref 134–144)

## 2016-11-30 LAB — CBC
HEMATOCRIT: 38.1 % (ref 37.5–51.0)
HEMOGLOBIN: 12.7 g/dL — AB (ref 13.0–17.7)
MCH: 36.2 pg — ABNORMAL HIGH (ref 26.6–33.0)
MCHC: 33.3 g/dL (ref 31.5–35.7)
MCV: 109 fL — AB (ref 79–97)
PLATELETS: 246 10*3/uL (ref 150–379)
RBC: 3.51 x10E6/uL — AB (ref 4.14–5.80)
RDW: 14.2 % (ref 12.3–15.4)
WBC: 4.5 10*3/uL (ref 3.4–10.8)

## 2016-11-30 LAB — IRON,TIBC AND FERRITIN PANEL
FERRITIN: 316 ng/mL (ref 30–400)
IRON SATURATION: 34 % (ref 15–55)
IRON: 83 ug/dL (ref 38–169)
Total Iron Binding Capacity: 243 ug/dL — ABNORMAL LOW (ref 250–450)
UIBC: 160 ug/dL (ref 111–343)

## 2016-11-30 NOTE — Telephone Encounter (Signed)
-----   Message from Melvenia Beam, MD sent at 11/30/2016  7:36 AM EDT ----- He is very slightly anemic, ( I would check a vitamin B12 and folate and methylmalonic acid. I can order those and they can come in anytime if they like please let me know). His TIBC is slightly low, this may be related to his mild anemia. Please forward to his primary care and ask them to discuss at next checkup.

## 2016-11-30 NOTE — Telephone Encounter (Signed)
I called pt, spoke to pt's wife, Manuela Schwartz, per DPR. I advised her that pt is very slightly anemic, and Dr. Jaynee Eagles says that she would check his vitamin b12, folate, and methylmalonic acid levels if pt would like these checked. I advised pt that the TIBC is slightly low, this may be related to his mild anemia. Pt's wife says that they would like the additional labs checked and that they will come in next week for the blood draw. Pt's wife will see Dr. Osborne Casco in October and asked that these labs be sent to him as well. Pt's verbalized understanding of results. Pt's wife had no questions at this time but was encouraged to call back if questions arise.

## 2016-12-03 DIAGNOSIS — F321 Major depressive disorder, single episode, moderate: Secondary | ICD-10-CM | POA: Diagnosis not present

## 2016-12-03 DIAGNOSIS — I951 Orthostatic hypotension: Secondary | ICD-10-CM | POA: Diagnosis not present

## 2016-12-03 DIAGNOSIS — E038 Other specified hypothyroidism: Secondary | ICD-10-CM | POA: Diagnosis not present

## 2016-12-03 DIAGNOSIS — H9193 Unspecified hearing loss, bilateral: Secondary | ICD-10-CM | POA: Diagnosis not present

## 2016-12-03 DIAGNOSIS — F482 Pseudobulbar affect: Secondary | ICD-10-CM | POA: Diagnosis not present

## 2016-12-03 DIAGNOSIS — R2689 Other abnormalities of gait and mobility: Secondary | ICD-10-CM | POA: Diagnosis not present

## 2016-12-03 DIAGNOSIS — S6290XS Unspecified fracture of unspecified wrist and hand, sequela: Secondary | ICD-10-CM | POA: Diagnosis not present

## 2016-12-03 DIAGNOSIS — Z6826 Body mass index (BMI) 26.0-26.9, adult: Secondary | ICD-10-CM | POA: Diagnosis not present

## 2016-12-03 DIAGNOSIS — R350 Frequency of micturition: Secondary | ICD-10-CM | POA: Diagnosis not present

## 2016-12-03 DIAGNOSIS — N401 Enlarged prostate with lower urinary tract symptoms: Secondary | ICD-10-CM | POA: Diagnosis not present

## 2016-12-03 DIAGNOSIS — G309 Alzheimer's disease, unspecified: Secondary | ICD-10-CM | POA: Diagnosis not present

## 2016-12-05 ENCOUNTER — Other Ambulatory Visit (INDEPENDENT_AMBULATORY_CARE_PROVIDER_SITE_OTHER): Payer: Self-pay

## 2016-12-05 DIAGNOSIS — D519 Vitamin B12 deficiency anemia, unspecified: Secondary | ICD-10-CM | POA: Diagnosis not present

## 2016-12-05 DIAGNOSIS — Z0289 Encounter for other administrative examinations: Secondary | ICD-10-CM

## 2016-12-07 ENCOUNTER — Telehealth: Payer: Self-pay

## 2016-12-07 LAB — METHYLMALONIC ACID, SERUM: Methylmalonic Acid: 195 nmol/L (ref 0–378)

## 2016-12-07 LAB — B12 AND FOLATE PANEL: VITAMIN B 12: 591 pg/mL (ref 232–1245)

## 2016-12-07 NOTE — Telephone Encounter (Signed)
-----   Message from Melvenia Beam, MD sent at 12/06/2016 10:19 AM EDT ----- B12 normal

## 2016-12-07 NOTE — Telephone Encounter (Signed)
Rn call patients wife about her husbands B12 was normal. Pts wife verbalized understanding.

## 2016-12-09 DIAGNOSIS — R3121 Asymptomatic microscopic hematuria: Secondary | ICD-10-CM | POA: Diagnosis not present

## 2016-12-09 DIAGNOSIS — R32 Unspecified urinary incontinence: Secondary | ICD-10-CM | POA: Diagnosis not present

## 2016-12-09 DIAGNOSIS — N3944 Nocturnal enuresis: Secondary | ICD-10-CM | POA: Diagnosis not present

## 2016-12-09 DIAGNOSIS — R8271 Bacteriuria: Secondary | ICD-10-CM | POA: Diagnosis not present

## 2016-12-09 DIAGNOSIS — N3000 Acute cystitis without hematuria: Secondary | ICD-10-CM | POA: Diagnosis not present

## 2016-12-15 DIAGNOSIS — S62325D Displaced fracture of shaft of fourth metacarpal bone, left hand, subsequent encounter for fracture with routine healing: Secondary | ICD-10-CM | POA: Diagnosis not present

## 2016-12-15 DIAGNOSIS — S62327D Displaced fracture of shaft of fifth metacarpal bone, left hand, subsequent encounter for fracture with routine healing: Secondary | ICD-10-CM | POA: Diagnosis not present

## 2016-12-21 ENCOUNTER — Other Ambulatory Visit: Payer: Self-pay | Admitting: *Deleted

## 2016-12-21 MED ORDER — MIDODRINE HCL 10 MG PO TABS
10.0000 mg | ORAL_TABLET | Freq: Three times a day (TID) | ORAL | 3 refills | Status: DC
Start: 2016-12-21 — End: 2019-03-31

## 2016-12-26 ENCOUNTER — Ambulatory Visit: Payer: Medicare Other | Admitting: Neurology

## 2016-12-27 DIAGNOSIS — G3184 Mild cognitive impairment, so stated: Secondary | ICD-10-CM | POA: Diagnosis not present

## 2016-12-27 DIAGNOSIS — F959 Tic disorder, unspecified: Secondary | ICD-10-CM | POA: Diagnosis not present

## 2016-12-27 DIAGNOSIS — F0634 Mood disorder due to known physiological condition with mixed features: Secondary | ICD-10-CM | POA: Diagnosis not present

## 2017-01-11 DIAGNOSIS — Z13820 Encounter for screening for osteoporosis: Secondary | ICD-10-CM | POA: Diagnosis not present

## 2017-01-13 DIAGNOSIS — R351 Nocturia: Secondary | ICD-10-CM | POA: Diagnosis not present

## 2017-01-13 DIAGNOSIS — N3944 Nocturnal enuresis: Secondary | ICD-10-CM | POA: Diagnosis not present

## 2017-01-13 DIAGNOSIS — F0391 Unspecified dementia with behavioral disturbance: Secondary | ICD-10-CM | POA: Diagnosis not present

## 2017-01-13 DIAGNOSIS — N3000 Acute cystitis without hematuria: Secondary | ICD-10-CM | POA: Diagnosis not present

## 2017-01-13 DIAGNOSIS — N401 Enlarged prostate with lower urinary tract symptoms: Secondary | ICD-10-CM | POA: Diagnosis not present

## 2017-01-17 DIAGNOSIS — S62327D Displaced fracture of shaft of fifth metacarpal bone, left hand, subsequent encounter for fracture with routine healing: Secondary | ICD-10-CM | POA: Diagnosis not present

## 2017-01-17 DIAGNOSIS — S62325D Displaced fracture of shaft of fourth metacarpal bone, left hand, subsequent encounter for fracture with routine healing: Secondary | ICD-10-CM | POA: Diagnosis not present

## 2017-02-08 DIAGNOSIS — E7800 Pure hypercholesterolemia, unspecified: Secondary | ICD-10-CM | POA: Diagnosis not present

## 2017-02-08 DIAGNOSIS — F321 Major depressive disorder, single episode, moderate: Secondary | ICD-10-CM | POA: Diagnosis not present

## 2017-02-08 DIAGNOSIS — S6290XS Unspecified fracture of unspecified wrist and hand, sequela: Secondary | ICD-10-CM | POA: Diagnosis not present

## 2017-02-08 DIAGNOSIS — E038 Other specified hypothyroidism: Secondary | ICD-10-CM | POA: Diagnosis not present

## 2017-02-08 DIAGNOSIS — I951 Orthostatic hypotension: Secondary | ICD-10-CM | POA: Diagnosis not present

## 2017-02-08 DIAGNOSIS — R350 Frequency of micturition: Secondary | ICD-10-CM | POA: Diagnosis not present

## 2017-02-08 DIAGNOSIS — H9193 Unspecified hearing loss, bilateral: Secondary | ICD-10-CM | POA: Diagnosis not present

## 2017-02-08 DIAGNOSIS — Z6825 Body mass index (BMI) 25.0-25.9, adult: Secondary | ICD-10-CM | POA: Diagnosis not present

## 2017-02-08 DIAGNOSIS — I519 Heart disease, unspecified: Secondary | ICD-10-CM | POA: Diagnosis not present

## 2017-02-08 DIAGNOSIS — G309 Alzheimer's disease, unspecified: Secondary | ICD-10-CM | POA: Diagnosis not present

## 2017-02-08 DIAGNOSIS — R2689 Other abnormalities of gait and mobility: Secondary | ICD-10-CM | POA: Diagnosis not present

## 2017-02-08 DIAGNOSIS — I10 Essential (primary) hypertension: Secondary | ICD-10-CM | POA: Diagnosis not present

## 2017-02-08 DIAGNOSIS — F482 Pseudobulbar affect: Secondary | ICD-10-CM | POA: Diagnosis not present

## 2017-02-15 ENCOUNTER — Encounter: Payer: Self-pay | Admitting: Cardiology

## 2017-02-15 ENCOUNTER — Ambulatory Visit (INDEPENDENT_AMBULATORY_CARE_PROVIDER_SITE_OTHER): Payer: Medicare Other | Admitting: Cardiology

## 2017-02-15 VITALS — BP 109/66 | HR 71 | Ht 71.0 in | Wt 186.6 lb

## 2017-02-15 DIAGNOSIS — I10 Essential (primary) hypertension: Secondary | ICD-10-CM

## 2017-02-15 DIAGNOSIS — E782 Mixed hyperlipidemia: Secondary | ICD-10-CM

## 2017-02-15 DIAGNOSIS — R0609 Other forms of dyspnea: Secondary | ICD-10-CM

## 2017-02-15 DIAGNOSIS — I951 Orthostatic hypotension: Secondary | ICD-10-CM | POA: Diagnosis not present

## 2017-02-15 MED ORDER — AMLODIPINE BESYLATE 5 MG PO TABS: 5.0000 mg | ORAL_TABLET | Freq: Every day | ORAL | 3 refills | Status: DC

## 2017-02-15 NOTE — Patient Instructions (Signed)
Medication Instructions: DECREASE Amlodipine to 5 mg daily STOP the Atorvastatin OK to stop the Aspirin   If you need a refill on your cardiac medications before your next appointment, please call your pharmacy.    Follow-Up: Your physician wants you to follow-up in 6 months with Dr. Ellyn Hack. You will receive a reminder letter in the mail two months in advance. If you don't receive a letter, please call our office at [phone] to schedule this follow-up appointment.    Thank you for choosing [facility]!!

## 2017-02-15 NOTE — Progress Notes (Signed)
PCP: Haywood Pao, MD  Clinic Note: Chief Complaint  Patient presents with  . Follow-up    othostatic hypotension  . Dizziness    HPI: Spencer Howard is a 83 y.o. male with a PMH below who presents today for 3 month f/u - with h/o orthostatic hypotension at the request of Tisovec, Fransico Him, MD.  I last saw him in January as follow-up evaluation of exertional dyspnea and atypical chest discomfort.  This was evaluated with a Myoview and stress test & Echocardiogram that were both relatively normal.  Spencer Howard was last seen on November 03, 2016 bu Bernerd Pho, PA-C after a hospitalization for syncope.  Dizziness improved since starting midodrine.  Has noticed some nocturnal incontinence.  Recent Hospitalizations:   8/6 - 11/04/2016 for evaluation of a syncopal event.  He had a "mechanical fall" with loss of balance but was noted to be profoundly orthostatic.  He was started on Midrin during that hospital stay, however he was continued on amlodipine 10mg .   Studies Personally Reviewed - (if available, images/films reviewed: From Epic Chart or Care Everywhere)  No new studies  Interval History: Spencer Howard has not had any further falls or pass out spells.  He does however note feeling dizzy quite often.  His Flomax was discontinued and not seem to have notably helped his dizziness.  He was just given a prescription for the Ditropan but is not necessarily started using it yet.  He has not had any passout spells, but his wife indicates that when he is feeling dizzy, he stops or is doing and put his head down.  He actually does not notice it as much in the morning as he does later on in the day. Otherwise he is as active as he has been.  He does not really have much in the way of exertional dyspnea.  No angina with rest or exertion.  No PND, orthopnea or edema.  No palpitations or rapid/irregular heartbeats.  No recurrent episodes of syncope/near syncope. No TIA/amaurosis fugax  symptoms.  No claudication.  ROS: A comprehensive was performed. Review of Systems  Constitutional: Negative for malaise/fatigue.  HENT: Negative for congestion and nosebleeds.   Respiratory: Negative for cough and wheezing.   Cardiovascular: Negative for leg swelling.  Gastrointestinal: Negative for blood in stool, constipation and heartburn.  Genitourinary: Negative for dysuria, frequency and hematuria.  Musculoskeletal: Positive for joint pain (Mild arthritis).  Skin: Negative.   Neurological: Positive for dizziness and weakness (Generalized). Negative for focal weakness.       Generally has poor balance.  Psychiatric/Behavioral: Positive for memory loss. Negative for depression. The patient is not nervous/anxious.   All other systems reviewed and are negative.   I have reviewed and (if needed) personally updated the patient's problem list, medications, allergies, past medical and surgical history, social and family history.   Past Medical History:  Diagnosis Date  . Alcohol abuse with alcohol-induced mental disorder ([place])   . Dementia associated with alcoholism ([place])    Alzheimer's dementia versus alcohol related dementia. 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 Depression   . High cholesterol   . Hypertension   . Hypothyroidism (acquired)     Past Surgical History:  Procedure Laterality Date  . ELBOW SURGERY Left 1995   Broken  . NM MYOVIEW LTD  02/2016   Normal LV function - EF 60-65%. No EKG changes. No ischemia or infarction noted on imaging. LOW RISK  . TONSILLECTOMY  1942  . TRANSTHORACIC ECHOCARDIOGRAM  02/2016   Moderate concentric LVH with EF 55-60%. GR 1 DD. Mild LA dilation. Mildly increased PA pressures  . VARICOCELECTOMY  1967    Current Meds  Medication Sig  . alendronate (FOSAMAX) 70 MG tablet Take 70 mg by mouth once a week.   Marland Kitchen amLODipine (NORVASC) 5 MG tablet Take 1 tablet (5 mg total) by mouth daily.  . Calcium  Citrate-Vitamin D (CALCIUM CITRATE + D3 PO) Take 1 tablet by mouth daily.  . Cholecalciferol (VITAMIN D3) 10000 UNITS TABS Take 1 tablet by mouth daily.  Marland Kitchen donepezil (ARICEPT) 10 MG tablet Take 1 tablet (10 mg total) by mouth 2 (two) times daily.  Marland Kitchen levothyroxine (SYNTHROID, LEVOTHROID) 50 MCG tablet Take 50 mcg by mouth daily before breakfast.  . methylphenidate (RITALIN) 10 MG tablet Take 10 mg by mouth every morning.  . midodrine (PROAMATINE) 10 MG tablet Take 1 tablet (10 mg total) by mouth 3 (three) times daily.  . Multiple Vitamins-Minerals (MULTIVITAMIN ADULT PO) Take 1 tablet by mouth daily.  Marland Kitchen oxybutynin (DITROPAN) 5 MG tablet Take 5 mg by mouth at bedtime.  Marland Kitchen PARoxetine (PAXIL) 10 MG tablet Take 10 mg by mouth at bedtime.  . [DISCONTINUED] amLODipine (NORVASC) 10 MG tablet Take 1 tablet (10 mg total) by mouth daily.  . [DISCONTINUED] atorvastatin (LIPITOR) 10 MG tablet Take 10 mg by mouth daily.    No Known Allergies  Social History   Socioeconomic History  . Marital status: Married    Spouse name: susan  . Number of children: 2  . Years of education: 3  . Highest education level: None  Social Needs  . Financial resource strain: None  . Food insecurity - worry: None  . Food insecurity - inability: None  . Transportation needs - medical: None  . Transportation needs - non-medical: None  Occupational History  . Occupation: Retired    Comment: Retired Transport planner. 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 Smoking status: Former Smoker    Last attempt to quit: 03/28/1969    Years since quitting: 47.9  . Smokeless tobacco: Never Used  Substance and Sexual Activity  . Alcohol use: Yes    Alcohol/week: 0.0 oz    Comment: 4-6 oz wine  . Drug use: No  . Sexual activity: None  Other Topics Concern  . None  Social History Narrative   Married father of 2, grandfather of 84, great grandfather of 28. He is married for 50 years.   Lives at home with wife.    Caffeine use: 1-2  cups per day   Drinks maybe 25-30 glasses of wine a week.   No routine exercise.   He grew up in [place] [employer] right-handed, throws left-handed    family history includes Colon cancer in his mother; Leukemia in his father.  Wt Readings from Last 3 Encounters:  02/15/17 186 lb 9.6 oz (84.6 kg)  11/29/16 192 lb (87.1 kg)  11/18/16 191 lb (86.6 kg)    PHYSICAL EXAM BP 109/66   Pulse 71   Ht 5\' 11"  (1.803 m)   Wt 186 lb 9.6 oz (84.6 kg)   BMI 26.03 kg/m  Physical Exam  Constitutional: He is oriented to person, place, and time. He appears well-developed and well-nourished.  Somewhat disheveled.  HENT:  Head: Normocephalic and atraumatic.  Mouth/Throat: No oropharyngeal exudate.  Eyes: EOM are normal. Pupils are equal, round, and reactive to light.  No scleral icterus.  Neck: Normal range of motion. Neck supple. No hepatojugular reflux and no JVD present. Carotid bruit is not present.  Cardiovascular: Normal rate, regular rhythm and intact distal pulses.  Occasional extrasystoles are present. PMI is not displaced. Exam reveals gallop and S4. Exam reveals no friction rub.  No murmur heard. Pulmonary/Chest: Effort normal and breath sounds normal. No respiratory distress. He has no wheezes. He has no rales.  Abdominal: Soft. Bowel sounds are normal. He exhibits no distension. There is no tenderness. There is no rebound.  Musculoskeletal: Normal range of motion. He exhibits no edema.  Neurological: He is alert and oriented to person, place, and time. Coordination abnormal.  Somewhat confused.  Poor memory.  Often looking to his wife to help answer questions.     Adult ECG Report none  Other studies Reviewed: Additional studies/ records that were reviewed today include:  Recent Labs:   Lab Results  Component Value Date   CREATININE 1.07 11/29/2016   BUN 21 11/29/2016   NA 140 11/29/2016   K 4.5 11/29/2016   CL 100 11/29/2016   CO2 27 11/29/2016     ASSESSMENT / PLAN: Problem List Items Addressed This Visit    DOE (dyspnea on exertion)    Normal Myoview and echo.  No real obvious cardiac etiology to explain this.  Is probably related to deconditioning.      Essential hypertension (Chronic)    Perhaps he not need medications.  Reducing amlodipine dose.      Relevant Medications   amLODipine (NORVASC) 5 MG tablet   HLD (hyperlipidemia) (Chronic)    He is on low-dose statin.  I think with his age and dementia issues, it is prudent to just simply stop the statin.  Long-term benefit is lacking.      Relevant Medications   amLODipine (NORVASC) 5 MG tablet   Orthostatic hypotension - Primary    He was not on amlodipine when I saw him the last time.  I do not know why he is on 10 mg of amlodipine and then 10 mg 3 times daily of midodrine. Plan will be to cut amlodipine to 5 mg daily.  If he still remains dizzy at that time I would just simply stop amlodipine. With the mild dizziness he has been having, I think midodrine and increasing hydration is the mainstay.  Salt liberalization, and support stockings also recommended.   Because he has had some lightheadedness and has had some falls, we will is a long-term benefit is not significant.  We can also stop atorvastatin to help reduce weakness.      Relevant Medications   amLODipine (NORVASC) 5 MG tablet      Current medicines are reviewed at length with the patient today. (+/- concerns) n/a The following changes have been made: cut amlodipine to 5 mg  Patient Instructions  Medication Instructions: DECREASE Amlodipine to 5 mg daily STOP the Atorvastatin OK to stop the Aspirin   If you need a refill on your cardiac medications before your next appointment, please call your pharmacy.    Follow-Up: Your physician wants you to follow-up in 6 months with Dr. Ellyn Hack. You will receive a reminder letter in the mail two months in advance. If you don't receive a letter, please  call our office at [phone] to schedule this follow-up appointment.    Thank you for choosing [facility] at [facility]!!        Studies Ordered:   No orders of the defined  types were placed in this encounter.     Glenetta Hew, M.D., M.S. Interventional Cardiologist   Pager # [phone] Phone # [phone] [address] [place], [place] [address]

## 2017-02-17 ENCOUNTER — Encounter: Payer: Self-pay | Admitting: Cardiology

## 2017-02-17 NOTE — Assessment & Plan Note (Signed)
Perhaps he not need medications.  Reducing amlodipine dose.

## 2017-02-17 NOTE — Assessment & Plan Note (Signed)
He was not on amlodipine when I saw him the last time.  I do not know why he is on 10 mg of amlodipine and then 10 mg 3 times daily of midodrine. Plan will be to cut amlodipine to 5 mg daily.  If he still remains dizzy at that time I would just simply stop amlodipine. With the mild dizziness he has been having, I think midodrine and increasing hydration is the mainstay.  Salt liberalization, and support stockings also recommended.   Because he has had some lightheadedness and has had some falls, we will is a long-term benefit is not significant.  We can also stop atorvastatin to help reduce weakness.

## 2017-02-17 NOTE — Assessment & Plan Note (Signed)
He is on low-dose statin.  I think with his age and dementia issues, it is prudent to just simply stop the statin.  Long-term benefit is lacking.

## 2017-02-17 NOTE — Assessment & Plan Note (Signed)
Normal Myoview and echo.  No real obvious cardiac etiology to explain this.  Is probably related to deconditioning.

## 2017-02-23 DIAGNOSIS — N401 Enlarged prostate with lower urinary tract symptoms: Secondary | ICD-10-CM | POA: Diagnosis not present

## 2017-02-23 DIAGNOSIS — N3000 Acute cystitis without hematuria: Secondary | ICD-10-CM | POA: Diagnosis not present

## 2017-02-23 DIAGNOSIS — N390 Urinary tract infection, site not specified: Secondary | ICD-10-CM | POA: Diagnosis not present

## 2017-02-23 DIAGNOSIS — R351 Nocturia: Secondary | ICD-10-CM | POA: Diagnosis not present

## 2017-02-23 DIAGNOSIS — R32 Unspecified urinary incontinence: Secondary | ICD-10-CM | POA: Diagnosis not present

## 2017-02-23 DIAGNOSIS — B954 Other streptococcus as the cause of diseases classified elsewhere: Secondary | ICD-10-CM | POA: Diagnosis not present

## 2017-02-27 DIAGNOSIS — F0634 Mood disorder due to known physiological condition with mixed features: Secondary | ICD-10-CM | POA: Diagnosis not present

## 2017-02-27 DIAGNOSIS — G3184 Mild cognitive impairment, so stated: Secondary | ICD-10-CM | POA: Diagnosis not present

## 2017-02-27 DIAGNOSIS — F959 Tic disorder, unspecified: Secondary | ICD-10-CM | POA: Diagnosis not present

## 2017-03-09 DIAGNOSIS — K5730 Diverticulosis of large intestine without perforation or abscess without bleeding: Secondary | ICD-10-CM | POA: Diagnosis not present

## 2017-03-09 DIAGNOSIS — N3000 Acute cystitis without hematuria: Secondary | ICD-10-CM | POA: Diagnosis not present

## 2017-03-30 DIAGNOSIS — N401 Enlarged prostate with lower urinary tract symptoms: Secondary | ICD-10-CM | POA: Diagnosis not present

## 2017-03-30 DIAGNOSIS — R32 Unspecified urinary incontinence: Secondary | ICD-10-CM | POA: Diagnosis not present

## 2017-03-30 DIAGNOSIS — N3944 Nocturnal enuresis: Secondary | ICD-10-CM | POA: Diagnosis not present

## 2017-04-13 DIAGNOSIS — N3944 Nocturnal enuresis: Secondary | ICD-10-CM | POA: Diagnosis not present

## 2017-05-11 DIAGNOSIS — R351 Nocturia: Secondary | ICD-10-CM | POA: Diagnosis not present

## 2017-05-11 DIAGNOSIS — R350 Frequency of micturition: Secondary | ICD-10-CM | POA: Diagnosis not present

## 2017-05-11 DIAGNOSIS — N3944 Nocturnal enuresis: Secondary | ICD-10-CM | POA: Diagnosis not present

## 2017-05-11 DIAGNOSIS — N401 Enlarged prostate with lower urinary tract symptoms: Secondary | ICD-10-CM | POA: Diagnosis not present

## 2017-05-29 ENCOUNTER — Ambulatory Visit (INDEPENDENT_AMBULATORY_CARE_PROVIDER_SITE_OTHER): Payer: Medicare Other | Admitting: Neurology

## 2017-05-29 ENCOUNTER — Encounter: Payer: Self-pay | Admitting: Neurology

## 2017-05-29 DIAGNOSIS — G301 Alzheimer's disease with late onset: Secondary | ICD-10-CM

## 2017-05-29 DIAGNOSIS — F0281 Dementia in other diseases classified elsewhere with behavioral disturbance: Secondary | ICD-10-CM | POA: Diagnosis not present

## 2017-05-29 DIAGNOSIS — F02818 Dementia in other diseases classified elsewhere, unspecified severity, with other behavioral disturbance: Secondary | ICD-10-CM | POA: Insufficient documentation

## 2017-05-29 DIAGNOSIS — G309 Alzheimer's disease, unspecified: Secondary | ICD-10-CM

## 2017-05-29 MED ORDER — MEMANTINE HCL 5 MG PO TABS: 5.0000 mg | ORAL_TABLET | Freq: Two times a day (BID) | ORAL | 11 refills | Status: AC

## 2017-05-29 NOTE — Progress Notes (Signed)
[facility]    Provider:  Dr Jaynee Eagles Referring Provider: Osborne Casco Fransico Him, MD Primary Care Physician:  Haywood Pao, MD  CC: Memory loss  Interval history 05/29/2017: Patient her efor follow up of dementia. Has had multiple complete neuropsychiatric evaluations and diagnosed with MCI likely Alzheimer's as well as depression. He is on Aricept.  He goes to [employer] connection twice a week for 4 hours. Other than that he sits, PT did "not take". He sits in a chair all day. He can walk around in the house but can't walk far. He has had one fall, now using the walker. Here with his wife who provides most information. His memory is worsening. MMSE 19/30 today (last 21). He refused to use the walker today. Encouraged using the walker all the time. 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 Wife goes to [employer]. He is constantly picking at his skin and they see [name] at [facility] for management. He has created a bald spot from picking. He has some tremors. He stopping drinking alcohol and is not driving. Wife feels short-term memory is worsening.   Interval history 11/29/2016: Was taking flomax but had to stop due to blood pressure concerns and since then having difficulty urinating and urinating overnight in the bed.  Memory is stable, not significantly worse. PT at home is helping. He feels he sleeps well. No snoring. He sleeps at regular hours and gets 8 hours of sleep. Following with Dr. [name] in psychiatry. Ritalin helps with the sleepiness.    Interval history 05/16/2016: Patient returns for follow up. 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 Had a long discussion regarding MCI, causes and risks for progression to Alzheimers and other dementias. He has had 1-2 falls since appointment, worsening gait. He went to PT but he is not doing the recommended exercises at home. Falls,  worsened cognition and imbalance, can;t leave the house alone, he is not driving anymore.  They have several issues to discuss today:  Drinking for the last year: he drinks a 796ml bottle of wine every 2 days. Been ongoing. Recommend max 2 glasses a day. However stopping abruptly can cause withdrawal and risk of death. F/u with Dr. Osborne Casco  Inactivity and imbalance and falls: encouraged activity, silver sneakers. Home nursing and PT at home.  Self mutilation, extreme agitation, depression, death of daughter: Recommend psychiatry Dr. [name] at [facility], he needs medical management but they also need family counseling for his MCI likely of Alzheimer's type or depression or both.   Driving; recommend no driving  Incontinent: f/u with Dr. Osborne Casco  F/u Dr. Vikki Ports for review of neuropsych testing   Interval History 07/29/2015: Short term memory is worsening. He is highly dependent on his watch. He forgets relationships of family members and grandchildren and great grandchildren. He is having balance problems, he staggers a lot, near falls, mostly in th emorning when he gets up, he gets up too fast and his blood pressure is low and gets dizzy. No numbness or tingling in the feet. Had recent bloodwork witll request from Dr. Osborne Casco.   HPI: Spencer Howard is a 84 y.o. male here as a referral from Dr. Osborne Casco for for dementia. PMHx memory loss, HTN, HLD, depression, hypothyroidism. Per a review of records, He has been experiencing short-term memory loss. Wife here with patient and provides information. Patient does not recall conversations, repeatedly asks questions, forgets appointments  and needs reminding every day of daily plans. He forgets to take his medications. He gets lost when he drives. He uses GPS now and wife is always with him. He got [place] golf [place]. Wife manages the finances. Symptoms started 3 years ago and slow progression. He did experience significant depression after the  loss of his daughter. A component of depression/anxiety was suspected. As such, a complete neuropsychological evaluation including labs and brain imaging was ordered to differentiate depression from a neurodegenerative disorder. Brain imaging and blood work were unrevealing and the MRI of the brain showed only "stable chronic small vessel changes". He was placed on Lexapro for depression. 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 A thorough neuropsychological evaluation was performed and mild cognitive impairment was diagnosed and not suggestive of dementia at this time. He is on Aricept. Further, the psychological issues is not the cause of his cognitive difficulties however it affects his quality of life and exacerbates existing cognitive deficits.  He just moved into the area and is here to establish care. He is using [employer]. He is a very pleasant and conversant gentleman here with his wife who provides just as much information as patient does. He says he sometimes asks for directions. He moved and this has been stressful but they are thrilled to be here around family. Memory changes started 3 years ago. It has been very slowly progressive. More recent than remote memories. He does not know the plans for the day, he says the same things over and over again, asks the same questions over and over. He gets frutrated and swears more but this is not a change in personality, just a little worse than he has been in the past. He remebers in the 7th grade he used to get checks for self control. He is better with his depression. His pcp is managing his depression, he is better on the Lexapro. The other day he got really mad but again he has done this in the past. One glass of wine a day. He has some bouts of frustration or anger. He denies inappropriate bouts of crying or laughing or other pseudobulbar symptoms. He does get angry but this is not something new.   Reviewed notes, labs and  imaging from outside physicians, which showed: Neuropsychological evaluation was performed in October 2016. [name] profile of findings appears to be best characterized as mild cognitive impairment, amnestic type. He demonstrated mild difficulty with simple attention and performance on measures of psychomotor speed was variable. He was able to understand directions for most measures but he did have trouble comprehending instructions for some of the more complicated tasks. He did not demonstrate the disorientation, deficient abstract reasoning, concrete clock drawing, impaired categorical fluency, reduced judgment and poor mental flexibility often observed in people with cortical dementia. And although he experiences memory lapses and his uvula, he is not demonstrating generalized functional impairment. This despite his memory decline Mr. Thetford does not meet full criteria for cortical dementia i.e. Alzheimer's disease at this time. However Mr. Kollman demonstrated greater memory loss that is typical for a person of his age. Small vessel ischemia would not cause the circumscribed memory deficits demonstrated by the patient. 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 Repeat neuropsychological assessment in 12 months is strongly recommended to assist with differential diagnosis. This is particularly important given that amnestic mild cognitive impairment is increasingly thought  to represent the early stages of dementia. Worsening of cognitive function in the context of stable cerebrovascular and overall health would provide strong evidence of a developing dementia.   Review of Systems: Patient complains of symptoms per HPI as well as the following symptoms: agitation. Pertinent negatives and positives per HPI. All others negative.   Social History   Socioeconomic History  . Marital status: Married    Spouse name: susan  . Number of  children: 2  . Years of education: 8  . Highest education level: Not on file  Social Needs  . Financial resource strain: Not on file  . Food insecurity - worry: Not on file  . Food insecurity - inability: Not on file  . Transportation needs - medical: Not on file  . Transportation needs - non-medical: Not on file  Occupational History  . Occupation: Retired    Comment: Retired Transport planner. 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 Smoking status: Former Smoker    Last attempt to quit: 03/28/1969    Years since quitting: 48.2  . Smokeless tobacco: Never Used  Substance and Sexual Activity  . Alcohol use: No    Alcohol/week: 0.0 oz    Frequency: Never    Comment: Stopped August 2018  . Drug use: No  . Sexual activity: Not on file  Other Topics Concern  . Not on file  Social History Narrative   Married father of 2, grandfather of 1, great grandfather of 34. He is married for 50 years.   Lives at home with wife.    Caffeine use: 1-2 cups per day   Drinks maybe 25-30 glasses of wine a week.= update: 3/4 None since August 2018   No routine exercise.   He grew up in [place] near [employer] right-handed, throws left-handed    Family History  Problem Relation Age of Onset  . Leukemia Father   . Colon cancer Mother   . Dementia Neg Hx     Past Medical History:  Diagnosis Date  . Alcohol abuse with alcohol-induced mental disorder ([place])   . Dementia associated with alcoholism ([place])    Alzheimer's dementia versus alcohol related dementia. 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 Depression   . High cholesterol   . Hypertension   . Hypothyroidism (acquired)     Past Surgical History:  Procedure Laterality Date  . ELBOW SURGERY Left 1995   Broken  . NM MYOVIEW LTD  02/2016   Normal LV function - EF 60-65%. No EKG changes. No ischemia or infarction noted on imaging. LOW RISK  . TONSILLECTOMY  1942  . TRANSTHORACIC ECHOCARDIOGRAM   02/2016   Moderate concentric LVH with EF 55-60%. GR 1 DD. Mild LA dilation. Mildly increased PA pressures  . VARICOCELECTOMY  1967    Current Outpatient Medications  Medication Sig Dispense Refill  . alendronate (FOSAMAX) 70 MG tablet Take 70 mg by mouth once a week.     Marland Kitchen amLODipine (NORVASC) 5 MG tablet Take 1 tablet (5 mg total) by mouth daily. 90 tablet 3  . Calcium Citrate-Vitamin D (CALCIUM CITRATE + D3 PO) Take 1 tablet by mouth daily.    . Cholecalciferol (VITAMIN D3) 10000 UNITS TABS Take 1 tablet by mouth daily.    Marland Kitchen desmopressin (DDAVP) 0.2 MG tablet Take 0.4 mg by mouth at bedtime.    . donepezil (ARICEPT) 10 MG tablet Take 1 tablet (10 mg total) by mouth  2 (two) times daily. 180 tablet 4  . levothyroxine (SYNTHROID, LEVOTHROID) 50 MCG tablet Take 50 mcg by mouth daily before breakfast.    . methylphenidate (RITALIN) 10 MG tablet Take 10 mg by mouth daily after lunch.   0  . midodrine (PROAMATINE) 10 MG tablet Take 1 tablet (10 mg total) by mouth 3 (three) times daily. 270 tablet 3  . Multiple Vitamins-Minerals (MULTIVITAMIN ADULT PO) Take 1 tablet by mouth daily.    Marland Kitchen PARoxetine (PAXIL) 10 MG tablet Take 10 mg by mouth at bedtime.    . memantine (NAMENDA) 5 MG tablet Take 1 tablet (5 mg total) by mouth 2 (two) times daily. 180 tablet 11   No current facility-administered medications for this visit.     Allergies as of 05/29/2017  . (No Known Allergies)    Vitals: BP (!) 104/57 (BP Location: Right Arm, Patient Position: Sitting)   Pulse 68   Ht 5\' 11"  (1.803 m)   Wt 202 lb (91.6 kg)   BMI 28.17 kg/m  Last Weight:  Wt Readings from Last 1 Encounters:  05/29/17 202 lb (91.6 kg)   Last Height:   Ht Readings from Last 1 Encounters:  05/29/17 5\' 11"  (1.803 m)   MMSE - Mini Mental State Exam 05/29/2017 11/29/2016 05/16/2016  Orientation to time 0 1 1  Orientation to Place 4 3 3   Registration 3 3 3   Attention/ Calculation 5 4 5   Recall 0 3 2  Language- name 2 objects  2 2 2   Language- repeat 0 0 0  Language- follow 3 step command 2 2 3   Language- read & follow direction 1 1 1   Write a sentence 1 1 1   Copy design 1 1 1   Total score 19 21 22       Cranial Nerves:  The pupils are equal, round, and reactive to light. The fundi are normal and spontaneous venous pulsations are present. Visual fields are full to finger confrontation. Extraocular movements are intact. Trigeminal sensation is intact and the muscles of mastication are normal. The face is symmetric. The palate elevates in the midline. Hearing intact. Voice is normal. Shoulder shrug is normal. The tongue has normal motion without fasciculations.   Coordination:  Normal finger to nose and heel to shin. Normal rapid alternating movements.   Gait:  Stands with imbalance  Motor Observation:  mild postural tremor Tone:  Normal muscle tone.   Posture:  Posture is normal. normal erect   Strength:  Strength is 4/5 in the upper and lower limbs.    Sensation: intact to LT      Assessment/Plan: 84 year old male here with MCI amnestic type possibly Alzheimer's prodrome or depression or both, unclear. Neuro exam with imbalance, recent falls. Extensive workup from previous neurologist and neuropsychological testing c/w MCI complicated by depression.Here with wife of 58 years. Continues to progress, recent neuropsych testing confirms cognitive impairment  - Urology consult for urinary frequency, incontinence and BPH -Cognitive impairment likely Alzheimer's disease: Continue Aricept,  - Drinking for the last year: he drinks a 771ml bottle of wine every 2 days. Been ongoing. Recommend max 2 glasses a day. However stopping abruptly can cause withdrawal and risk of death. F/u with Dr. Osborne Casco - Inactivity and imbalance and falls: encouraged activity, silver sneakers. Home nursing and PT at home. Participates at [facility], extreme agitation,  depression, death of daughter: Follows with psychiatry Dr. [name] at [facility], he needs medical management but they also  need family counseling for his MCI likely of Alzheimer's type vs depression or both.  - Driving; recommend no driving: He is not driving - Discussed agitation and akathisia, provided a list of medications that can cause this: continues, follows with [name] at [facility] and they would like to start slowly 5mg  a day then titrate to 5mg  bid and eventually 10mg  twice daily -  Continue Aricept 10mg  twice a day - Patient will follow up with [facility] and continue to see PA every 6 months - Continue to follow with [name] for psychiatric management   B12 collected 06/15/2015 678, CMP unremarkable except creatinine 1.3, TSH 3.13 normal  Labs 11/11/2016: CMP: BUN 22, creatinine 1.2, MCV elevated at 11 otherwise unremarkable. TSH normal.       Sarina Ill, MD  [facility] [address] [place], [place] [address]  Phone [phone] Fax [phone]  A total of 30 minutes was spent face-to-face with this patient. Over half this time was spent on counseling patient on the Dementia diagnosis and different therapeutic options available.

## 2017-05-29 NOTE — Patient Instructions (Signed)
Start with 5mg  daily Memanatine (Namenda) and if no side effects increase to twice daily. Consider increasing to 10mg  twice dialy in the future.  Memantine Tablets What is this medicine? MEMANTINE (MEM an teen) is used to treat dementia caused by Alzheimer's disease. This medicine may be used for other purposes; ask your health care provider or pharmacist if you have questions. COMMON BRAND NAME(S): Namenda What should I tell my health care provider before I take this medicine? They need to know if you have any of these conditions: -difficulty passing urine -kidney disease -liver disease -seizures -an unusual or allergic reaction to memantine, other medicines, foods, dyes, or preservatives -pregnant or trying to get pregnant -breast-feeding How should I use this medicine? Take this medicine by mouth with a glass of water. Follow the directions on the prescription label. You may take this medicine with or without food. Take your doses at regular intervals. Do not take your medicine more often than directed. Continue to take your medicine even if you feel better. Do not stop taking except on the advice of your doctor or health care professional. Talk to your pediatrician regarding the use of this medicine in children. Special care may be needed. Overdosage: If you think you have taken too much of this medicine contact a poison control center or emergency room at once. NOTE: This medicine is only for you. Do not share this medicine with others. What if I miss a dose? If you miss a dose, take it as soon as you can. If it is almost time for your next dose, take only that dose. Do not take double or extra doses. If you do not take your medicine for several days, contact your health care provider. Your dose may need to be changed. What may interact with this  medicine? -acetazolamide -amantadine -cimetidine -dextromethorphan -dofetilide -hydrochlorothiazide -ketamine -metformin -methazolamide -quinidine -ranitidine -sodium bicarbonate -triamterene This list may not describe all possible interactions. Give your health care provider a list of all the medicines, herbs, non-prescription drugs, or dietary supplements you use. Also tell them if you smoke, drink alcohol, or use illegal drugs. Some items may interact with your medicine. What should I watch for while using this medicine? Visit your doctor or health care professional for regular checks on your progress. Check with your doctor or health care professional if there is no improvement in your symptoms or if they get worse. You may get drowsy or dizzy. Do not drive, use machinery, or do anything that needs mental alertness until you know how this drug affects you. Do not stand or sit up quickly, especially if you are an older patient. This reduces the risk of dizzy or fainting spells. Alcohol can make you more drowsy and dizzy. Avoid alcoholic drinks. What side effects may I notice from receiving this medicine? Side effects that you should report to your doctor or health care professional as soon as possible: -allergic reactions like skin rash, itching or hives, swelling of the face, lips, or tongue -agitation or a feeling of restlessness -depressed mood -dizziness -hallucinations -redness, blistering, peeling or loosening of the skin, including inside the mouth -seizures -vomiting Side effects that usually do not require medical attention (report to your doctor or health care professional if they continue or are bothersome): -constipation -diarrhea -headache -nausea -trouble sleeping This list may not describe all possible side effects. Call your doctor for medical advice about side effects. You may report side effects to FDA at 1-800-FDA-1088. Where should I keep  my medicine? Keep  out of the reach of children. Store at room temperature between 15 degrees and 30 degrees C (59 degrees and 86 degrees F). Throw away any unused medicine after the expiration date. NOTE: This sheet is a summary. It may not cover all possible information. If you have questions about this medicine, talk to your doctor, pharmacist, or health care provider.  2018 Elsevier/Gold Standard (2012-12-31 14:10:42)

## 2017-06-01 DIAGNOSIS — F959 Tic disorder, unspecified: Secondary | ICD-10-CM | POA: Diagnosis not present

## 2017-06-01 DIAGNOSIS — F429 Obsessive-compulsive disorder, unspecified: Secondary | ICD-10-CM | POA: Diagnosis not present

## 2017-06-01 DIAGNOSIS — G3184 Mild cognitive impairment, so stated: Secondary | ICD-10-CM | POA: Diagnosis not present

## 2017-07-06 ENCOUNTER — Other Ambulatory Visit: Payer: Self-pay | Admitting: Neurology

## 2017-07-13 ENCOUNTER — Emergency Department (HOSPITAL_COMMUNITY): Payer: Medicare Other

## 2017-07-13 ENCOUNTER — Encounter (HOSPITAL_COMMUNITY): Payer: Self-pay | Admitting: Emergency Medicine

## 2017-07-13 ENCOUNTER — Emergency Department (HOSPITAL_COMMUNITY)
Admission: EM | Admit: 2017-07-13 | Discharge: 2017-07-13 | Disposition: A | Discharge status: Home / Self Care | Payer: Medicare Other | Attending: Emergency Medicine | Admitting: Emergency Medicine

## 2017-07-13 ENCOUNTER — Other Ambulatory Visit: Payer: Self-pay

## 2017-07-13 DIAGNOSIS — S199XXA Unspecified injury of neck, initial encounter: Secondary | ICD-10-CM | POA: Diagnosis not present

## 2017-07-13 DIAGNOSIS — R269 Unspecified abnormalities of gait and mobility: Secondary | ICD-10-CM | POA: Diagnosis not present

## 2017-07-13 DIAGNOSIS — E039 Hypothyroidism, unspecified: Secondary | ICD-10-CM | POA: Diagnosis not present

## 2017-07-13 DIAGNOSIS — F0281 Dementia in other diseases classified elsewhere with behavioral disturbance: Secondary | ICD-10-CM | POA: Insufficient documentation

## 2017-07-13 DIAGNOSIS — S299XXA Unspecified injury of thorax, initial encounter: Secondary | ICD-10-CM | POA: Diagnosis not present

## 2017-07-13 DIAGNOSIS — Z87891 Personal history of nicotine dependence: Secondary | ICD-10-CM | POA: Insufficient documentation

## 2017-07-13 DIAGNOSIS — I10 Essential (primary) hypertension: Secondary | ICD-10-CM | POA: Diagnosis not present

## 2017-07-13 DIAGNOSIS — R410 Disorientation, unspecified: Secondary | ICD-10-CM | POA: Diagnosis not present

## 2017-07-13 DIAGNOSIS — S0990XA Unspecified injury of head, initial encounter: Secondary | ICD-10-CM | POA: Diagnosis not present

## 2017-07-13 DIAGNOSIS — Z79899 Other long term (current) drug therapy: Secondary | ICD-10-CM | POA: Diagnosis not present

## 2017-07-13 DIAGNOSIS — R2689 Other abnormalities of gait and mobility: Secondary | ICD-10-CM | POA: Diagnosis not present

## 2017-07-13 DIAGNOSIS — R531 Weakness: Secondary | ICD-10-CM | POA: Diagnosis not present

## 2017-07-13 DIAGNOSIS — G308 Other Alzheimer's disease: Secondary | ICD-10-CM | POA: Insufficient documentation

## 2017-07-13 DIAGNOSIS — W19XXXA Unspecified fall, initial encounter: Secondary | ICD-10-CM

## 2017-07-13 LAB — BASIC METABOLIC PANEL
Anion gap: 8 (ref 5–15)
BUN: 19 mg/dL (ref 6–20)
CALCIUM: 9.3 mg/dL (ref 8.9–10.3)
CO2: 29 mmol/L (ref 22–32)
CREATININE: 1.31 mg/dL — AB (ref 0.61–1.24)
Chloride: 100 mmol/L — ABNORMAL LOW (ref 101–111)
GFR calc Af Amer: 55 mL/min — ABNORMAL LOW (ref >60)
GFR, EST NON AFRICAN AMERICAN: 48 mL/min — AB (ref >60)
GLUCOSE: 104 mg/dL — AB (ref 65–99)
Potassium: 4.9 mmol/L (ref 3.5–5.1)
Sodium: 137 mmol/L (ref 135–145)

## 2017-07-13 LAB — URINALYSIS, ROUTINE W REFLEX MICROSCOPIC
BILIRUBIN URINE: NEGATIVE
Glucose, UA: NEGATIVE mg/dL
HGB URINE DIPSTICK: NEGATIVE
KETONES UR: NEGATIVE mg/dL
Leukocytes, UA: NEGATIVE
Nitrite: NEGATIVE
PROTEIN: NEGATIVE mg/dL
SPECIFIC GRAVITY, URINE: 1.015 (ref 1.005–1.030)
pH: 6 (ref 5.0–8.0)

## 2017-07-13 LAB — TSH: TSH: 4.774 u[IU]/mL — AB (ref 0.350–4.500)

## 2017-07-13 LAB — CBC
HCT: 37 % — ABNORMAL LOW (ref 39.0–52.0)
Hemoglobin: 12.5 g/dL — ABNORMAL LOW (ref 13.0–17.0)
MCH: 36.8 pg — AB (ref 26.0–34.0)
MCHC: 33.8 g/dL (ref 30.0–36.0)
MCV: 108.8 fL — ABNORMAL HIGH (ref 78.0–100.0)
PLATELETS: 230 10*3/uL (ref 150–400)
RBC: 3.4 MIL/uL — ABNORMAL LOW (ref 4.22–5.81)
RDW: 15 % (ref 11.5–15.5)
WBC: 7.3 10*3/uL (ref 4.0–10.5)

## 2017-07-13 LAB — T4, FREE: Free T4: 1.05 ng/dL (ref 0.61–1.12)

## 2017-07-13 MED ORDER — LACTATED RINGERS IV BOLUS
1000.0000 mL | Freq: Once | INTRAVENOUS | Status: AC
  Administered 2017-07-13: 1000 mL via INTRAVENOUS

## 2017-07-13 NOTE — Progress Notes (Signed)
CSW and CM met with pt and pt's family to discuss placement and home health. Pt is not eligible for home health to be covered by Medicare insurance at this time. Pt does not have 3 night qualifying stay. Pt and pt's wife are agreeable to having Home Health come out. Pt has had home health with [facility] in the past. 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 Encouraged pt and pt's wife to bring up concerns about pt's mobility and strength to the [facility] to see what additional resources they may have.    , LCSWA [facility]  [phone]  

## 2017-07-13 NOTE — ED Notes (Signed)
E-signature not available, pt verbalized understanding of DC instructions and prescriptions 

## 2017-07-13 NOTE — ED Triage Notes (Signed)
Pt accompanied by family, hx of dementia. Pt family reports he normally uses a walker. Over the past week he has had 4 falls, most recent one being this morning. Not on blood thinners. No LOC with falls. PT hit his head in the fall yesterday. Pt only c.o. Left sided back pain. Pt wife states that he has had decreased mobility this week and now states he can barely move to get out of bed. Pt repositioning himself in the wheelchair, able to lift both legs and arms, to prop his legs up on the chair.

## 2017-07-13 NOTE — ED Provider Notes (Signed)
Level 5 caveat dementia history is obtained from his wife.  Patient has had multiple falls in the past week.  No injury.  His wife reports he tends to walk flex at the waist.  He is been using a walker only for the past week before that he walked unassisted.  On exam patient is alert pleasant cooperative HEENT exam there are abrasions well-healing of the forehead otherwise normocephalic atraumatic neck supple neurologic moves all extremities cranial nerves II through XII grossly intact.  No facial asymmetry   Orlie Dakin, MD 07/13/17 [phone]

## 2017-07-13 NOTE — ED Provider Notes (Signed)
[facility] DEPARTMENT Provider Note   CSN: [id number] Arrival date & time: 07/13/17  1022     History   Chief Complaint Chief Complaint  Patient presents with  . Fall    HPI Spencer Howard is a 84 y.o. male.  Patient with history of dementia, high cholesterol, hypertension who presents to the ED with multiple falls.  Wife is with patient he states that he has had increasing falls over the last several days.  Patient has history of dementia and typically ambulates with a walker as needed.  However, patient has poor gait even with a walker.  Wife is been unable to safely take care of him at home.  Denies any infectious symptoms.  Patient denies any chest pain, shortness of breath, abdominal pain.  Patient not on any blood thinners.  Patient fell yesterday and hit his head but no loss of consciousness.  The history is provided by the patient and a caregiver.  Illness  This is a new problem. The current episode started more than 2 days ago. The problem occurs daily. The problem has not changed since onset.Pertinent negatives include no chest pain, no abdominal pain, no headaches and no shortness of breath. Nothing aggravates the symptoms. Nothing relieves the symptoms. He has tried nothing for the symptoms. The treatment provided no relief.    Past Medical History:  Diagnosis Date  . Alcohol abuse with alcohol-induced mental disorder ([place])   . Dementia associated with alcoholism ([place])    Alzheimer's dementia versus alcohol related dementia. 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 Depression   . High cholesterol   . Hypertension   . Hypothyroidism (acquired)     Patient Active Problem List   Diagnosis Date Noted  . Dementia, Alzheimer's, with behavior disturbance 05/29/2017  . Orthostatic hypotension 11/04/2016  . Closed left hand fracture 11/02/2016  . Dementia 11/02/2016  . Syncope 10/31/2016  . DOE (dyspnea on  exertion) 03/05/2016  . Abnormal fourth heart sound (S4) 03/05/2016  . Essential hypertension 02/01/2015  . HLD (hyperlipidemia) 02/01/2015  . Depressed 02/01/2015  . Hypothyroidism 02/01/2015    Past Surgical History:  Procedure Laterality Date  . ELBOW SURGERY Left 1995   Broken  . NM MYOVIEW LTD  02/2016   Normal LV function - EF 60-65%. No EKG changes. No ischemia or infarction noted on imaging. LOW RISK  . TONSILLECTOMY  1942  . TRANSTHORACIC ECHOCARDIOGRAM  02/2016   Moderate concentric LVH with EF 55-60%. GR 1 DD. Mild LA dilation. Mildly increased PA pressures  . VARICOCELECTOMY  1967        Home Medications    Prior to Admission medications   Medication Sig Start Date End Date Taking? Authorizing Provider  alendronate (FOSAMAX) 70 MG tablet Take 70 mg by mouth every Sunday.  05/18/15  Yes [provider]  amLODipine (NORVASC) 5 MG tablet Take 1 tablet (5 mg total) by mouth daily. 02/15/17  Yes Leonie Man, MD  Calcium Citrate-Vitamin D (CALCIUM CITRATE + D3 PO) Take 1 tablet by mouth daily.   Yes [provider]  Cholecalciferol (VITAMIN D3) 10000 UNITS TABS Take 2,000 Units by mouth daily.    Yes [provider]  desmopressin (DDAVP) 0.2 MG tablet Take 0.4 mg by mouth at bedtime.   Yes [provider]  donepezil (ARICEPT) 10 MG tablet TAKE 1 TABLET TWICE A DAY 07/06/17  Yes Melvenia Beam, MD  levothyroxine (SYNTHROID, LEVOTHROID) 50 MCG  tablet Take 50 mcg by mouth daily before breakfast.   Yes [provider]  memantine (NAMENDA) 5 MG tablet Take 1 tablet (5 mg total) by mouth 2 (two) times daily. 05/29/17  Yes Melvenia Beam, MD  methylphenidate (RITALIN) 10 MG tablet Take 10 mg by mouth daily after lunch.  10/18/16  Yes [provider]  midodrine (PROAMATINE) 10 MG tablet Take 1 tablet (10 mg total) by mouth 3 (three) times daily. 12/21/16  Yes Melvenia Beam, MD  Multiple Vitamins-Minerals (CENTRUM SILVER  50+MEN) TABS Take 1 tablet by mouth daily with breakfast.   Yes [provider]  PARoxetine (PAXIL) 20 MG tablet Take 20 mg by mouth 2 (two) times daily. 06/27/17  Yes [provider]    Family History Family History  Problem Relation Age of Onset  . Leukemia Father   . Colon cancer Mother   . Dementia Neg Hx     Social History Social History   Tobacco Use  . Smoking status: Former Smoker    Last attempt to quit: 03/28/1969    Years since quitting: 48.3  . Smokeless tobacco: Never Used  Substance Use Topics  . Alcohol use: No    Alcohol/week: 0.0 oz    Frequency: Never    Comment: Stopped August 2018  . Drug use: No     Allergies   Patient has no known allergies.   Review of Systems Review of Systems  Constitutional: Negative for chills and fever.  HENT: Negative for ear pain and sore throat.   Eyes: Negative for pain and visual disturbance.  Respiratory: Negative for cough and shortness of breath.   Cardiovascular: Negative for chest pain and palpitations.  Gastrointestinal: Negative for abdominal pain and vomiting.  Genitourinary: Negative for dysuria and hematuria.  Musculoskeletal: Positive for gait problem. Negative for arthralgias and back pain.  Skin: Negative for color change and rash.  Neurological: Positive for weakness. Negative for dizziness, seizures, syncope, facial asymmetry and headaches.  Psychiatric/Behavioral: Positive for confusion (baseline).  All other systems reviewed and are negative.    Physical Exam Updated Vital Signs  ED Triage Vitals  Enc Vitals Group     BP 07/13/17 1037 (!) 142/58     Pulse Rate 07/13/17 1037 68     Resp 07/13/17 1037 17     Temp 07/13/17 1037 98.3 F (36.8 C)     Temp Source 07/13/17 1037 Oral     SpO2 07/13/17 1037 98 %     Weight 07/13/17 1037 200 lb (90.7 kg)     Height 07/13/17 1037 5\' 11"  (1.803 m)     Head Circumference --      Peak Flow --      Pain Score 07/13/17 1142 2     Pain  Loc --      Pain Edu? --      Excl. in [place]? --     Physical Exam  Constitutional: He appears well-developed and well-nourished. No distress.  HENT:  Head: Normocephalic and atraumatic.  Mouth/Throat: No oropharyngeal exudate.  Eyes: Pupils are equal, round, and reactive to light. Conjunctivae and EOM are normal.  Neck: Normal range of motion. Neck supple.  Cardiovascular: Normal rate and regular rhythm.  No murmur heard. Pulmonary/Chest: Effort normal and breath sounds normal. No respiratory distress.  Abdominal: Soft. Bowel sounds are normal. There is no tenderness.  Musculoskeletal: He exhibits no edema.  Neurological: He is alert. No cranial nerve deficit or sensory deficit. He exhibits  normal muscle tone.  5+/5 strength and normal sensation throughout, no drift, normal finger to nose finger, wide based gait and ataxia, normal heal to shin  Skin: Skin is warm and dry.  Psychiatric: He has a normal mood and affect.  Nursing note and vitals reviewed.    ED Treatments / Results  Labs (all labs ordered are listed, but only abnormal results are displayed) Labs Reviewed  BASIC METABOLIC PANEL - Abnormal; Notable for the following components:      Result Value   Chloride 100 (*)    Glucose, Bld 104 (*)    Creatinine, Ser 1.31 (*)    GFR calc non Af Amer 48 (*)    GFR calc Af Amer 55 (*)    All other components within normal limits  CBC - Abnormal; Notable for the following components:   RBC 3.40 (*)    Hemoglobin 12.5 (*)    HCT 37.0 (*)    MCV 108.8 (*)    MCH 36.8 (*)    All other components within normal limits  URINALYSIS, ROUTINE W REFLEX MICROSCOPIC - Abnormal; Notable for the following components:   APPearance HAZY (*)    All other components within normal limits  TSH - Abnormal; Notable for the following components:   TSH 4.774 (*)    All other components within normal limits  URINE CULTURE  T4, FREE    EKG EKG Interpretation  Date/Time:  Thursday July 13 2017 18:12:38 EDT Ventricular Rate:  84 PR Interval:    QRS Duration: 96 QT Interval:  380 QTC Calculation: 450 R Axis:   -20 Text Interpretation:  Sinus rhythm Prolonged PR interval Borderline left axis deviation No significant change since last tracing Confirmed by Orlie Dakin [phone]) on 07/13/2017 6:17:21 PM   Radiology Dg Chest 2 View  Result Date: 07/13/2017 CLINICAL DATA:  84 y/o  M; fall. EXAM: CHEST - 2 VIEW COMPARISON:  10/31/2016 chest radiograph. FINDINGS: Stable normal cardiac silhouette given projection and technique. Aortic atherosclerosis with calcification. Clear lungs. No pleural effusion or pneumothorax. No acute osseous abnormality is evident. IMPRESSION: No acute process. Electronically Signed   By: Kristine Garbe M.D.   On: 07/13/2017 19:07   Ct Head Wo Contrast  Result Date: 07/13/2017 CLINICAL DATA:  84 y/o M; history of dementia with multiple falls. Head injury yesterday. EXAM: CT HEAD WITHOUT CONTRAST CT CERVICAL SPINE WITHOUT CONTRAST TECHNIQUE: Multidetector CT imaging of the head and cervical spine was performed following the standard protocol without intravenous contrast. Multiplanar CT image reconstructions of the cervical spine were also generated. COMPARISON:  10/31/2016 CT head FINDINGS: CT HEAD FINDINGS Brain: No evidence of acute infarction, hemorrhage, hydrocephalus, extra-axial collection or mass lesion/mass effect. Stable chronic microvascular ischemic changes and parenchymal volume loss of the brain. Vascular: Calcific atherosclerosis of carotid siphons and vertebral arteries. No hyperdense vessel. Skull: Normal. Negative for fracture or focal lesion. Sinuses/Orbits: No acute finding. Other: Bilateral intra-ocular lens replacement. CT CERVICAL SPINE FINDINGS Alignment: C4-5 minimal grade 1 retrolisthesis. Mild reversal of cervical curvature. C7-T1 grade 1 anterolisthesis. Skull base and vertebrae: No acute fracture. No primary bone lesion or focal  pathologic process. Soft tissues and spinal canal: No prevertebral fluid or swelling. No visible canal hematoma. Disc levels: Cervical spondylosis with predominantly discogenic degenerative changes at the C4 through C7 levels. On the left uncovertebral and facet hypertrophy of C4-C7 encroaches on the neural foramen and on the right at C4-5 and C6-7. Multiple levels of multifactorial mild bony canal  stenosis Upper chest: Negative. Other: Multiple thyroid nodules measuring up to 15 mm in the right lobe of thyroid (series 9, image 89). Calcific atherosclerosis of carotid bifurcations. IMPRESSION: 1. No acute intracranial abnormality or calvarial fracture. 2. No acute fracture or dislocation of cervical spine. 3. Stable chronic microvascular ischemic changes and parenchymal volume loss of the brain. 4. Moderate cervical spondylosis at the C4 through C7 levels. 5. Thyroid nodules measuring up to 15 mm in the right lobe. Consider thyroid ultrasound for further evaluation as clinically indicated. Electronically Signed   By: Kristine Garbe M.D.   On: 07/13/2017 17:31   Ct Cervical Spine Wo Contrast  Result Date: 07/13/2017 CLINICAL DATA:  84 y/o M; history of dementia with multiple falls. Head injury yesterday. EXAM: CT HEAD WITHOUT CONTRAST CT CERVICAL SPINE WITHOUT CONTRAST TECHNIQUE: Multidetector CT imaging of the head and cervical spine was performed following the standard protocol without intravenous contrast. Multiplanar CT image reconstructions of the cervical spine were also generated. COMPARISON:  10/31/2016 CT head FINDINGS: CT HEAD FINDINGS Brain: No evidence of acute infarction, hemorrhage, hydrocephalus, extra-axial collection or mass lesion/mass effect. Stable chronic microvascular ischemic changes and parenchymal volume loss of the brain. Vascular: Calcific atherosclerosis of carotid siphons and vertebral arteries. No hyperdense vessel. Skull: Normal. Negative for fracture or focal lesion.  Sinuses/Orbits: No acute finding. Other: Bilateral intra-ocular lens replacement. CT CERVICAL SPINE FINDINGS Alignment: C4-5 minimal grade 1 retrolisthesis. Mild reversal of cervical curvature. C7-T1 grade 1 anterolisthesis. Skull base and vertebrae: No acute fracture. No primary bone lesion or focal pathologic process. Soft tissues and spinal canal: No prevertebral fluid or swelling. No visible canal hematoma. Disc levels: Cervical spondylosis with predominantly discogenic degenerative changes at the C4 through C7 levels. On the left uncovertebral and facet hypertrophy of C4-C7 encroaches on the neural foramen and on the right at C4-5 and C6-7. Multiple levels of multifactorial mild bony canal stenosis Upper chest: Negative. Other: Multiple thyroid nodules measuring up to 15 mm in the right lobe of thyroid (series 9, image 89). Calcific atherosclerosis of carotid bifurcations. IMPRESSION: 1. No acute intracranial abnormality or calvarial fracture. 2. No acute fracture or dislocation of cervical spine. 3. Stable chronic microvascular ischemic changes and parenchymal volume loss of the brain. 4. Moderate cervical spondylosis at the C4 through C7 levels. 5. Thyroid nodules measuring up to 15 mm in the right lobe. Consider thyroid ultrasound for further evaluation as clinically indicated. Electronically Signed   By: Kristine Garbe M.D.   On: 07/13/2017 17:31   Mr Brain Wo Contrast  Result Date: 07/13/2017 CLINICAL DATA:  Initial evaluation for acute altered mental status, history of dementia, recent falls. EXAM: MRI HEAD WITHOUT CONTRAST TECHNIQUE: Multiplanar, multiecho pulse sequences of the brain and surrounding structures were obtained without intravenous contrast. COMPARISON:  Prior CT from earlier the same day. FINDINGS: Brain: Moderate cerebral atrophy, most notable within the temporal and parietal regions bilaterally. Patchy and confluent T2/FLAIR hyperintensity within the periventricular and deep  white matter both cerebral hemispheres, consistent with chronic small vessel ischemic change. No abnormal foci of restricted diffusion to suggest acute infarct. Gray-white matter differentiation maintained. No encephalomalacia to suggest chronic infarction. No foci of susceptibility artifact to suggest acute or chronic intracranial hemorrhage. No mass lesion, midline shift or mass effect. Diffuse ventricular prominence related to global parenchymal volume loss without hydrocephalus. No extra-axial fluid collection. Pituitary gland and suprasellar region normal. Midline structures intact. Subcentimeter inconsequential pineal cyst noted. Vascular: Poor flow void present within the slightly  diminutive left vertebral artery, which may be related to slow flow and/or occlusion. Major intracranial vascular flow voids are otherwise maintained. Skull and upper cervical spine: Craniocervical junction within normal limits. Upper cervical spine normal. Bone marrow signal intensity and scalp soft tissues unremarkable. Sinuses/Orbits: Globes and orbital soft tissues within normal limits. Patient status post cataract extraction bilaterally. Paranasal sinuses are clear. Small left mastoid effusion noted, of doubtful significance. Inner ear structures normal. Other: None IMPRESSION: 1. No acute intracranial abnormality. 2. Moderate cerebral atrophy with chronic small vessel ischemic disease. Electronically Signed   By: Jeannine Boga M.D.   On: 07/13/2017 23:23    Procedures Procedures (including critical care time)  Medications Ordered in ED Medications  lactated ringers bolus 1,000 mL (0 mLs Intravenous Stopped 07/13/17 1907)     Initial Impression / Assessment and Plan / ED Course  I have reviewed the triage vital signs and the nursing notes.  Pertinent labs & imaging results that were available during my care of the patient were reviewed by me and considered in my medical decision making (see chart for  details).     Spencer Howard is a 84 year old male with history of dementia, prior alcohol abuse who presents to the ED with falls, worsening of mental status.  Patient with unremarkable vitals upon arrival.  No fever.  Patient w/ multiple falls over the last several days according to his wife.  She is concerned about the decline in his current state.  Patient typically ambulates with a walker but usually has difficulty with that.  She has noticed increasing falls and having more difficulty taking care of him at home.  Patient would last fall yesterday in which he hit his head.  No loss of consciousness.  Patient is not on blood thinners.  Patient has no infectious symptoms.  He is overall well-appearing.  His neuro exam is unremarkable except for patient appears ataxic on exam.  He struggles to walk with assistance.  However, patient with no weakness or sensation changes.  He is alert and very pleasant.  Concern for likely worsening dementia but will get labs to rule out electrolyte abnormalities, urinary tract infection.  Will obtain CT head and MRI head to rule out intracranial process.  Patient given LR bolus.  Patient with chest x-ray that showed no signs of pneumonia, pneumothorax, pleural effusion.  Urinalysis without any signs of infection.  Patient with no significant electrolyte abnormality, anemia, acute kidney injury to account for symptoms.  CT of head showed no intracranial hemorrhage.  MRI of the brain did not show any signs of stroke.  No acute findings on head imaging.  Case manager was involved and will help patient to get access to home health.  Recommend that patient have close follow-up with primary care provider tomorrow.  Had discussion with wife about patient's activities of daily living and recommend starting to think about possible long-term placement.  Patient likely with worsening dementia and discharged from the ED in good condition.  Told to return to ED if symptoms worsen.  Final  Clinical Impressions(s) / ED Diagnoses   Final diagnoses:  Fall, initial encounter  Gait abnormality    ED Discharge Orders    None       Lennice Sites, DO 07/13/17 [id number]    Orlie Dakin, MD 07/13/17 2356

## 2017-07-13 NOTE — ED Notes (Signed)
Patient transported to MRI 

## 2017-07-14 LAB — URINE CULTURE

## 2017-07-14 NOTE — Progress Notes (Signed)
07/14/17 at [facility]oss BSN NCM CM noted patient was discharge orders were placed late. ED CM discussed [place] services with patient and wife while in the. Patient and wife were receptive to the services, choice was offered [facility] was selected. CM faxed orders 4/19 to [facility]. CM explained that a nurse would contact them 24 -48 hours post discharge, patient and wife were agreeable with the transitional care plan.  ED CM will follow up with patient and wife on Monday 4/22.

## 2017-07-17 IMAGING — NM NM MISC PROCEDURE
6 series · 36 of 36 positions shown · non-contrast
Comparison: none

[Series 1: wbr_r-proj_st wbr rest · 6.40mm/px · 6 of 64 frames shown]
[frame 6/64]
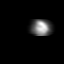
[frame 16/64]
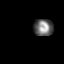
[frame 27/64]
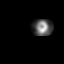
[frame 38/64]
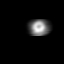
[frame 48/64]
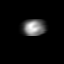
[frame 59/64]
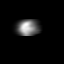

[Series 1: wbr rest · 6.40mm/px · 6 of 64 frames shown]
[frame 6/64]
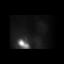
[frame 16/64]
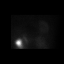
[frame 27/64]
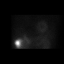
[frame 38/64]
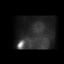
[frame 48/64]
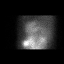
[frame 59/64]
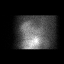

[Series 2: wbr_s-proj_st wbr stress-gsp · 6.40mm/px · 6 of 512 frames shown]
[frame 43/512]
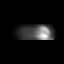
[frame 128/512]
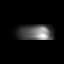
[frame 214/512]
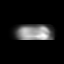
[frame 299/512]
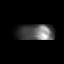
[frame 384/512]
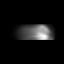
[frame 470/512]
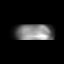

[Series 2: wbr stress-gsp · 6.40mm/px · 6 of 512 frames shown]
[frame 43/512]
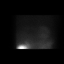
[frame 128/512]
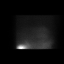
[frame 214/512]
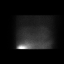
[frame 299/512]
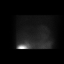
[frame 384/512]
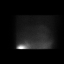
[frame 470/512]
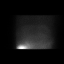

[Series 3: wbr stress-sum-em · 6.40mm/px · 6 of 64 frames shown]
[frame 6/64]
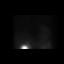
[frame 16/64]
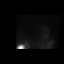
[frame 27/64]
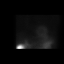
[frame 38/64]
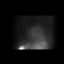
[frame 48/64]
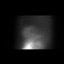
[frame 59/64]
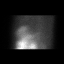

[Series 3: wbr_s-proj_st wbr stress-sum-em · 6.40mm/px · 6 of 64 frames shown]
[frame 6/64]
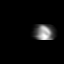
[frame 16/64]
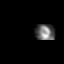
[frame 27/64]
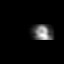
[frame 38/64]
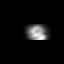
[frame 48/64]
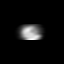
[frame 59/64]
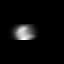

[36 of 36 positions shown; findings below may reference images not displayed]

Canned report from images found in remote index.

Refer to host system for actual result text.

## 2017-07-31 DIAGNOSIS — F429 Obsessive-compulsive disorder, unspecified: Secondary | ICD-10-CM | POA: Diagnosis not present

## 2017-07-31 DIAGNOSIS — F959 Tic disorder, unspecified: Secondary | ICD-10-CM | POA: Diagnosis not present

## 2017-07-31 DIAGNOSIS — G3184 Mild cognitive impairment, so stated: Secondary | ICD-10-CM | POA: Diagnosis not present

## 2017-07-31 DIAGNOSIS — F0634 Mood disorder due to known physiological condition with mixed features: Secondary | ICD-10-CM | POA: Diagnosis not present

## 2017-08-09 DIAGNOSIS — H6121 Impacted cerumen, right ear: Secondary | ICD-10-CM | POA: Diagnosis not present

## 2017-08-09 DIAGNOSIS — R946 Abnormal results of thyroid function studies: Secondary | ICD-10-CM | POA: Diagnosis not present

## 2017-08-09 DIAGNOSIS — M810 Age-related osteoporosis without current pathological fracture: Secondary | ICD-10-CM | POA: Diagnosis not present

## 2017-08-09 DIAGNOSIS — E7800 Pure hypercholesterolemia, unspecified: Secondary | ICD-10-CM | POA: Diagnosis not present

## 2017-08-09 DIAGNOSIS — I10 Essential (primary) hypertension: Secondary | ICD-10-CM | POA: Diagnosis not present

## 2017-08-09 DIAGNOSIS — G309 Alzheimer's disease, unspecified: Secondary | ICD-10-CM | POA: Diagnosis not present

## 2017-08-09 DIAGNOSIS — I951 Orthostatic hypotension: Secondary | ICD-10-CM | POA: Diagnosis not present

## 2017-08-09 DIAGNOSIS — N401 Enlarged prostate with lower urinary tract symptoms: Secondary | ICD-10-CM | POA: Diagnosis not present

## 2017-08-09 DIAGNOSIS — F321 Major depressive disorder, single episode, moderate: Secondary | ICD-10-CM | POA: Diagnosis not present

## 2017-08-09 DIAGNOSIS — E049 Nontoxic goiter, unspecified: Secondary | ICD-10-CM | POA: Diagnosis not present

## 2017-08-09 DIAGNOSIS — F482 Pseudobulbar affect: Secondary | ICD-10-CM | POA: Diagnosis not present

## 2017-08-09 DIAGNOSIS — E038 Other specified hypothyroidism: Secondary | ICD-10-CM | POA: Diagnosis not present

## 2017-08-17 ENCOUNTER — Other Ambulatory Visit (HOSPITAL_COMMUNITY): Payer: Self-pay | Admitting: Internal Medicine

## 2017-08-17 DIAGNOSIS — E049 Nontoxic goiter, unspecified: Secondary | ICD-10-CM

## 2017-08-23 ENCOUNTER — Ambulatory Visit (INDEPENDENT_AMBULATORY_CARE_PROVIDER_SITE_OTHER): Payer: Medicare Other | Admitting: Cardiology

## 2017-08-23 ENCOUNTER — Encounter: Payer: Self-pay | Admitting: Cardiology

## 2017-08-23 VITALS — BP 128/72 | HR 92 | Ht 71.0 in | Wt 195.8 lb

## 2017-08-23 DIAGNOSIS — E782 Mixed hyperlipidemia: Secondary | ICD-10-CM | POA: Diagnosis not present

## 2017-08-23 DIAGNOSIS — R0609 Other forms of dyspnea: Secondary | ICD-10-CM | POA: Diagnosis not present

## 2017-08-23 DIAGNOSIS — I951 Orthostatic hypotension: Secondary | ICD-10-CM

## 2017-08-23 DIAGNOSIS — R55 Syncope and collapse: Secondary | ICD-10-CM

## 2017-08-23 DIAGNOSIS — I10 Essential (primary) hypertension: Secondary | ICD-10-CM

## 2017-08-23 NOTE — Patient Instructions (Signed)
NO MEDICATION CHANGES  CURRENTLY     Your physician wants you to follow-up in [place]. You will receive a reminder letter in the mail two months in advance. If you don't receive a letter, please call our office to schedule the follow-up appointment.   If you need a refill on your cardiac medications before your next appointment, please call your pharmacy.

## 2017-08-23 NOTE — Progress Notes (Signed)
PCP: Haywood Pao, MD  Clinic Note: Chief Complaint  Patient presents with  . Follow-up    HPI: Spencer Howard is a 84 y.o. male with a PMH below who presents today for 6 month f/u for orthostatic hypotension.  I last saw him in January as follow-up evaluation of exertional dyspnea and atypical chest discomfort.  This was evaluated with a Myoview and stress test & Echocardiogram that were both relatively normal.  Spencer Howard was last seen in Nov 2018 had any further falls or pass out spells.  He does however note feeling dizzy quite often.  His Flomax was discontinued and not seem to have notably helped his dizziness.   Recent Hospitalizations:   Fall 07/13/17  Studies Personally Reviewed - (if available, images/films reviewed: From Epic Chart or Care Everywhere)  No new cardiac Sx.  Interval History: Mr. Spencer Howard returns for f/u really without any significant cardiac symptoms besides 2 episodes of woozy (1 pass out @ PCP office, related to injury). His fall back in April was related to his balance being off and he tripped, did not have any loss of consciousness. Spencer Howard is pleasantly confused more so than usual, and most of the history is provided by his wife.  He does probably have some exertional dyspnea, but is more limited by fatigue.  He really does not do much, in fact not enough to really even make him susceptible to having anginal symptoms with rest or exertion.  He has not had any chest pain or shortness breath at rest with the extent of exertion that he does do.  No PND orthopnea with trivial edema.  No complaint of rapid irregular heartbeats palpitations.  No syncope/near syncope or TIA/amaurosis fugax.   No claudication  ROS: A comprehensive was performed. Review of Systems  Constitutional: Negative for malaise/fatigue.  HENT: Negative for congestion and nosebleeds.   Respiratory: Negative for cough.   Cardiovascular: Negative for leg swelling.  Gastrointestinal:  Negative for blood in stool, constipation and heartburn.  Genitourinary: Negative for dysuria, frequency and hematuria.  Musculoskeletal: Positive for joint pain (Mild arthritis).  Skin: Negative.   Neurological: Positive for dizziness and weakness (Generalized). Negative for focal weakness.       Generally has poor balance.  Psychiatric/Behavioral: Positive for memory loss. Negative for depression. The patient is not nervous/anxious.   All other systems reviewed and are negative.   I have reviewed and (if needed) personally updated the patient's problem list, medications, allergies, past medical and surgical history, social and family history.   Past Medical History:  Diagnosis Date  . Alcohol abuse with alcohol-induced mental disorder ([place])   . Dementia associated with alcoholism ([place])    Alzheimer's dementia versus alcohol related dementia. 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 Depression   . High cholesterol   . Hypertension   . Hypothyroidism (acquired)     Past Surgical History:  Procedure Laterality Date  . ELBOW SURGERY Left 1995   Broken  . NM MYOVIEW LTD  02/2016   Normal LV function - EF 60-65%. No EKG changes. No ischemia or infarction noted on imaging. LOW RISK  . TONSILLECTOMY  1942  . TRANSTHORACIC ECHOCARDIOGRAM  02/2016   Moderate concentric LVH with EF 55-60%. GR 1 DD. Mild LA dilation. Mildly increased PA pressures  . VARICOCELECTOMY  1967    Current Meds  Medication Sig  . alendronate (FOSAMAX) 70 MG tablet Take 70 mg by mouth every Sunday.   Spencer Howard  Calcium Citrate-Vitamin D (CALCIUM CITRATE + D3 PO) Take 1 tablet by mouth daily.  . Cholecalciferol (VITAMIN D3) 10000 UNITS TABS Take 2,000 Units by mouth daily.   Spencer Howard donepezil (ARICEPT) 10 MG tablet TAKE 1 TABLET TWICE A DAY  . levothyroxine (SYNTHROID, LEVOTHROID) 50 MCG tablet Take 50 mcg by mouth daily before breakfast.  . memantine (NAMENDA) 5 MG tablet Take 1 tablet (5 mg  total) by mouth 2 (two) times daily.  . methylphenidate (RITALIN) 10 MG tablet Take 10 mg by mouth daily after lunch.   . midodrine (PROAMATINE) 10 MG tablet Take 1 tablet (10 mg total) by mouth 3 (three) times daily.  . Multiple Vitamins-Minerals (CENTRUM SILVER 50+MEN) TABS Take 1 tablet by mouth daily with breakfast.  . PARoxetine (PAXIL) 10 MG tablet Take 10 mg by mouth 2 (two) times daily.     No Known Allergies  Social History   Tobacco Use  . Smoking status: Former Smoker    Last attempt to quit: 03/28/1969    Years since quitting: 48.4  . Smokeless tobacco: Never Used  Substance Use Topics  . Alcohol use: No    Alcohol/week: 0.0 oz    Frequency: Never    Comment: Stopped August 2018  . Drug use: No   Social History   Social History Narrative   Married father of 2, grandfather of 35, great grandfather of 11. He is married for 50 years.   Lives at home with wife.    Caffeine use: 1-2 cups per day   Drinks maybe 25-30 glasses of wine a week.= update: 3/4 None since August 2018   No routine exercise.   He grew up in [place] [employer] right-handed, throws left-handed    family history includes Colon cancer in his mother; Leukemia in his father.  Wt Readings from Last 3 Encounters:  08/23/17 195 lb 12.8 oz (88.8 kg)  07/13/17 200 lb (90.7 kg)  05/29/17 202 lb (91.6 kg)    PHYSICAL EXAM BP 128/72   Pulse 92   Ht 5\' 11"  (1.803 m)   Wt 195 lb 12.8 oz (88.8 kg)   SpO2 96%   BMI 27.31 kg/m  Physical Exam  Constitutional: He is oriented to person, place, and time. He appears well-developed and well-nourished.  Somewhat disheveled.  HENT:  Head: Normocephalic and atraumatic.  Mouth/Throat: No oropharyngeal exudate.  Eyes: Pupils are equal, round, and reactive to light. EOM are normal. No scleral icterus.  Neck: Normal range of motion. Neck supple. No hepatojugular reflux and no JVD present. Carotid bruit is not present.  Cardiovascular: Normal rate,  regular rhythm and intact distal pulses.  Occasional extrasystoles are present. PMI is not displaced. Exam reveals gallop and S4. Exam reveals no friction rub.  No murmur heard. Pulmonary/Chest: Effort normal and breath sounds normal. No respiratory distress. He has no wheezes. He has no rales.  Abdominal: Soft. Bowel sounds are normal. He exhibits no distension. There is no tenderness. There is no rebound.  Musculoskeletal: Normal range of motion. He exhibits no edema.  Neurological: He is alert and oriented to person, place, and time. Coordination abnormal.  Somewhat confused.  Poor memory.  Often looking to his wife to help answer questions.  Psychiatric:  He himself is pleasantly confused.  He waxes and wanes in and out of the conversation.  He is alert and oriented to himself and location.  He knows his wife and me. Seems to want to argue about symptoms  with his wife, 6 to diminish his symptoms.     Adult ECG Report none  Other studies Reviewed: Additional studies/ records that were reviewed today include:  Recent Labs:   Lab Results  Component Value Date   CREATININE 1.31 (H) 07/13/2017   BUN 19 07/13/2017   NA 137 07/13/2017   K 4.9 07/13/2017   CL 100 (L) 07/13/2017   CO2 29 07/13/2017    ASSESSMENT / PLAN: Problem List Items Addressed This Visit    Syncope (Chronic)    Not really sure what actually caused this episode.  Following for any abnormal findings but nothing so far to suggest anything more than just vasoactive/vasovagal neurocardiogenic syncope -exacerbated by orthostatic hypotension.      Orthostatic hypotension (Chronic)    Less symptomatic since stopping amlodipine.  Continue current dose of midodrine.  He does seem to be a little less motivated and active today than before.      HLD (hyperlipidemia) (Chronic)    No longer on statin per my last note.  Long-term benefit lacking.      Essential hypertension - Primary (Chronic)    Blood pressure looks  stable today.  No longer on medications.  In fact he has had hypotension.  Now on midodrine      DOE (dyspnea on exertion)    So far and essentially normal cardiac evaluation with relative normal echocardiogram and nonischemic Myoview.  I suspect his dyspnea is probably related to deconditioning and loss of balance.         Current medicines are reviewed at length with the patient today. (+/- concerns) n/a The following changes have been made: cut amlodipine to 5 mg  Patient Instructions  NO MEDICATION CHANGES  CURRENTLY     Your physician wants you to follow-up in [place]. You will receive a reminder letter in the mail two months in advance. If you don't receive a letter, please call our office to schedule the follow-up appointment.   If you need a refill on your cardiac medications before your next appointment, please call your pharmacy.    Studies Ordered:   No orders of the defined types were placed in this encounter.     Glenetta Hew, M.D., M.S. Interventional Cardiologist   Pager # [phone] Phone # [phone] [address] [place], [place] [address]

## 2017-08-26 NOTE — Assessment & Plan Note (Addendum)
Not really sure what actually caused this episode.  Following for any abnormal findings but nothing so far to suggest anything more than just vasoactive/vasovagal neurocardiogenic syncope -exacerbated by orthostatic hypotension.

## 2017-08-26 NOTE — Assessment & Plan Note (Signed)
Blood pressure looks stable today.  No longer on medications.  In fact he has had hypotension.  Now on midodrine

## 2017-08-26 NOTE — Assessment & Plan Note (Signed)
No longer on statin per my last note.  Long-term benefit lacking.

## 2017-08-26 NOTE — Assessment & Plan Note (Signed)
So far and essentially normal cardiac evaluation with relative normal echocardiogram and nonischemic Myoview.  I suspect his dyspnea is probably related to deconditioning and loss of balance.

## 2017-08-26 NOTE — Assessment & Plan Note (Signed)
Less symptomatic since stopping amlodipine.  Continue current dose of midodrine.  He does seem to be a little less motivated and active today than before.

## 2017-08-28 ENCOUNTER — Ambulatory Visit (HOSPITAL_COMMUNITY)
Admission: RE | Admit: 2017-08-28 | Discharge: 2017-08-28 | Disposition: A | Discharge status: Home / Self Care | Payer: Medicare Other | Source: Ambulatory Visit | Attending: Internal Medicine | Admitting: Internal Medicine

## 2017-08-28 DIAGNOSIS — E049 Nontoxic goiter, unspecified: Secondary | ICD-10-CM

## 2017-08-28 DIAGNOSIS — E042 Nontoxic multinodular goiter: Secondary | ICD-10-CM | POA: Diagnosis not present

## 2017-09-17 ENCOUNTER — Emergency Department (HOSPITAL_COMMUNITY): Payer: Medicare Other

## 2017-09-17 ENCOUNTER — Other Ambulatory Visit: Payer: Self-pay

## 2017-09-17 ENCOUNTER — Encounter (HOSPITAL_COMMUNITY): Payer: Self-pay

## 2017-09-17 ENCOUNTER — Observation Stay (HOSPITAL_COMMUNITY): Payer: Medicare Other

## 2017-09-17 ENCOUNTER — Inpatient Hospital Stay (HOSPITAL_COMMUNITY)
Admission: EM | Admit: 2017-09-17 | Discharge: 2017-09-19 | DRG: 884 | Disposition: A | Discharge status: Home Health | Payer: Medicare Other | Attending: Internal Medicine | Admitting: Internal Medicine

## 2017-09-17 DIAGNOSIS — F0150 Vascular dementia without behavioral disturbance: Secondary | ICD-10-CM | POA: Diagnosis not present

## 2017-09-17 DIAGNOSIS — R55 Syncope and collapse: Secondary | ICD-10-CM | POA: Diagnosis not present

## 2017-09-17 DIAGNOSIS — E039 Hypothyroidism, unspecified: Secondary | ICD-10-CM | POA: Diagnosis not present

## 2017-09-17 DIAGNOSIS — I440 Atrioventricular block, first degree: Secondary | ICD-10-CM | POA: Diagnosis not present

## 2017-09-17 DIAGNOSIS — R4182 Altered mental status, unspecified: Secondary | ICD-10-CM | POA: Diagnosis present

## 2017-09-17 DIAGNOSIS — R296 Repeated falls: Secondary | ICD-10-CM | POA: Diagnosis present

## 2017-09-17 DIAGNOSIS — R29702 NIHSS score 2: Secondary | ICD-10-CM | POA: Diagnosis present

## 2017-09-17 DIAGNOSIS — R269 Unspecified abnormalities of gait and mobility: Secondary | ICD-10-CM | POA: Diagnosis present

## 2017-09-17 DIAGNOSIS — R0902 Hypoxemia: Secondary | ICD-10-CM | POA: Diagnosis not present

## 2017-09-17 DIAGNOSIS — R531 Weakness: Secondary | ICD-10-CM | POA: Diagnosis present

## 2017-09-17 DIAGNOSIS — Z515 Encounter for palliative care: Secondary | ICD-10-CM

## 2017-09-17 DIAGNOSIS — Z79899 Other long term (current) drug therapy: Secondary | ICD-10-CM

## 2017-09-17 DIAGNOSIS — F1020 Alcohol dependence, uncomplicated: Secondary | ICD-10-CM | POA: Diagnosis present

## 2017-09-17 DIAGNOSIS — D649 Anemia, unspecified: Secondary | ICD-10-CM | POA: Diagnosis not present

## 2017-09-17 DIAGNOSIS — Z8659 Personal history of other mental and behavioral disorders: Secondary | ICD-10-CM

## 2017-09-17 DIAGNOSIS — Z7983 Long term (current) use of bisphosphonates: Secondary | ICD-10-CM

## 2017-09-17 DIAGNOSIS — R404 Transient alteration of awareness: Secondary | ICD-10-CM

## 2017-09-17 DIAGNOSIS — R4189 Other symptoms and signs involving cognitive functions and awareness: Secondary | ICD-10-CM

## 2017-09-17 DIAGNOSIS — R4020 Unspecified coma: Secondary | ICD-10-CM | POA: Diagnosis not present

## 2017-09-17 DIAGNOSIS — S0121XA Laceration without foreign body of nose, initial encounter: Secondary | ICD-10-CM | POA: Diagnosis not present

## 2017-09-17 DIAGNOSIS — N3944 Nocturnal enuresis: Secondary | ICD-10-CM | POA: Diagnosis present

## 2017-09-17 DIAGNOSIS — E232 Diabetes insipidus: Secondary | ICD-10-CM | POA: Diagnosis present

## 2017-09-17 DIAGNOSIS — W19XXXA Unspecified fall, initial encounter: Secondary | ICD-10-CM | POA: Diagnosis not present

## 2017-09-17 DIAGNOSIS — F419 Anxiety disorder, unspecified: Secondary | ICD-10-CM | POA: Diagnosis present

## 2017-09-17 DIAGNOSIS — Z66 Do not resuscitate: Secondary | ICD-10-CM | POA: Diagnosis present

## 2017-09-17 DIAGNOSIS — S299XXA Unspecified injury of thorax, initial encounter: Secondary | ICD-10-CM | POA: Diagnosis not present

## 2017-09-17 DIAGNOSIS — F329 Major depressive disorder, single episode, unspecified: Secondary | ICD-10-CM | POA: Diagnosis present

## 2017-09-17 DIAGNOSIS — Z8679 Personal history of other diseases of the circulatory system: Secondary | ICD-10-CM

## 2017-09-17 DIAGNOSIS — R402413 Glasgow coma scale score 13-15, at hospital admission: Secondary | ICD-10-CM | POA: Diagnosis present

## 2017-09-17 DIAGNOSIS — Z87891 Personal history of nicotine dependence: Secondary | ICD-10-CM

## 2017-09-17 DIAGNOSIS — M4854XA Collapsed vertebra, not elsewhere classified, thoracic region, initial encounter for fracture: Secondary | ICD-10-CM | POA: Diagnosis present

## 2017-09-17 DIAGNOSIS — I10 Essential (primary) hypertension: Secondary | ICD-10-CM | POA: Diagnosis present

## 2017-09-17 DIAGNOSIS — R159 Full incontinence of feces: Secondary | ICD-10-CM | POA: Diagnosis present

## 2017-09-17 LAB — COMPREHENSIVE METABOLIC PANEL
ALT: 15 U/L — AB (ref 17–63)
ANION GAP: 11 (ref 5–15)
AST: 32 U/L (ref 15–41)
Albumin: 4 g/dL (ref 3.5–5.0)
Alkaline Phosphatase: 70 U/L (ref 38–126)
BUN: 15 mg/dL (ref 6–20)
CHLORIDE: 101 mmol/L (ref 101–111)
CO2: 24 mmol/L (ref 22–32)
CREATININE: 1.24 mg/dL (ref 0.61–1.24)
Calcium: 9.3 mg/dL (ref 8.9–10.3)
GFR, EST AFRICAN AMERICAN: 59 mL/min — AB (ref >60)
GFR, EST NON AFRICAN AMERICAN: 51 mL/min — AB (ref >60)
Glucose, Bld: 118 mg/dL — ABNORMAL HIGH (ref 65–99)
POTASSIUM: 4.2 mmol/L (ref 3.5–5.1)
SODIUM: 136 mmol/L (ref 135–145)
Total Bilirubin: 0.9 mg/dL (ref 0.3–1.2)
Total Protein: 6.9 g/dL (ref 6.5–8.1)

## 2017-09-17 LAB — URINALYSIS, ROUTINE W REFLEX MICROSCOPIC
Bacteria, UA: NONE SEEN
Bilirubin Urine: NEGATIVE
Glucose, UA: NEGATIVE mg/dL
Hgb urine dipstick: NEGATIVE
KETONES UR: NEGATIVE mg/dL
LEUKOCYTES UA: NEGATIVE
NITRITE: NEGATIVE
PROTEIN: 30 mg/dL — AB
Specific Gravity, Urine: 1.019 (ref 1.005–1.030)
pH: 6 (ref 5.0–8.0)

## 2017-09-17 LAB — CBC WITH DIFFERENTIAL/PLATELET
BASOS ABS: 0 10*3/uL (ref 0.0–0.1)
Basophils Relative: 0 %
EOS PCT: 3 %
Eosinophils Absolute: 0.2 10*3/uL (ref 0.0–0.7)
HEMATOCRIT: 39.9 % (ref 39.0–52.0)
Hemoglobin: 12.7 g/dL — ABNORMAL LOW (ref 13.0–17.0)
LYMPHS ABS: 0.9 10*3/uL (ref 0.7–4.0)
Lymphocytes Relative: 15 %
MCH: 36.1 pg — ABNORMAL HIGH (ref 26.0–34.0)
MCHC: 31.8 g/dL (ref 30.0–36.0)
MCV: 113.4 fL — ABNORMAL HIGH (ref 78.0–100.0)
MONOS PCT: 14 %
Monocytes Absolute: 0.8 10*3/uL (ref 0.1–1.0)
Neutro Abs: 3.8 10*3/uL (ref 1.7–7.7)
Neutrophils Relative %: 68 %
PLATELETS: 184 10*3/uL (ref 150–400)
RBC: 3.52 MIL/uL — AB (ref 4.22–5.81)
RDW: 14.7 % (ref 11.5–15.5)
WBC: 5.7 10*3/uL (ref 4.0–10.5)

## 2017-09-17 LAB — MAGNESIUM: MAGNESIUM: 2.2 mg/dL (ref 1.7–2.4)

## 2017-09-17 LAB — TROPONIN I: Troponin I: <0.03 ng/mL (ref <0.03)

## 2017-09-17 LAB — ETHANOL

## 2017-09-17 MED ORDER — ACETAMINOPHEN 650 MG RE SUPP: 650.0000 mg | Freq: Four times a day (QID) | RECTAL | Status: DC | PRN

## 2017-09-17 MED ORDER — ENOXAPARIN SODIUM 40 MG/0.4ML ~~LOC~~ SOLN
40.0000 mg | Freq: Every day | SUBCUTANEOUS | Status: DC
  Administered 2017-09-18 – 2017-09-19 (×2): 40 mg via SUBCUTANEOUS
  Filled 2017-09-17 (×2): qty 0.4

## 2017-09-17 MED ORDER — ACETAMINOPHEN 325 MG PO TABS: 650.0000 mg | ORAL_TABLET | Freq: Four times a day (QID) | ORAL | Status: DC | PRN

## 2017-09-17 MED ORDER — LEVOTHYROXINE SODIUM 50 MCG PO TABS: 50.0000 ug | ORAL_TABLET | Freq: Every day | ORAL | Status: DC

## 2017-09-17 MED ORDER — DESMOPRESSIN ACETATE 0.2 MG PO TABS
0.2000 mg | ORAL_TABLET | Freq: Every day | ORAL | Status: DC
  Administered 2017-09-18 (×2): 0.2 mg via ORAL
  Filled 2017-09-17 (×3): qty 1

## 2017-09-17 MED ORDER — MEMANTINE HCL 5 MG PO TABS
5.0000 mg | ORAL_TABLET | Freq: Two times a day (BID) | ORAL | Status: DC
  Administered 2017-09-18 – 2017-09-19 (×4): 5 mg via ORAL
  Filled 2017-09-17 (×5): qty 1

## 2017-09-17 MED ORDER — SODIUM CHLORIDE 0.9 % IV BOLUS
500.0000 mL | Freq: Once | INTRAVENOUS | Status: AC
  Administered 2017-09-17: 500 mL via INTRAVENOUS

## 2017-09-17 MED ORDER — ADULT MULTIVITAMIN W/MINERALS CH
1.0000 | ORAL_TABLET | Freq: Every day | ORAL | Status: DC
  Administered 2017-09-18 – 2017-09-19 (×2): 1 via ORAL
  Filled 2017-09-17 (×2): qty 1

## 2017-09-17 MED ORDER — CALCIUM CARBONATE-VITAMIN D 500-200 MG-UNIT PO TABS
1.0000 | ORAL_TABLET | Freq: Every day | ORAL | Status: DC
  Administered 2017-09-18 – 2017-09-19 (×2): 1 via ORAL
  Filled 2017-09-17 (×2): qty 1

## 2017-09-17 MED ORDER — MIDODRINE HCL 5 MG PO TABS
10.0000 mg | ORAL_TABLET | Freq: Three times a day (TID) | ORAL | Status: DC
  Administered 2017-09-18 – 2017-09-19 (×3): 10 mg via ORAL
  Filled 2017-09-17 (×3): qty 2

## 2017-09-17 MED ORDER — SODIUM CHLORIDE 0.9 % IV SOLN
INTRAVENOUS | Status: DC
  Administered 2017-09-17: via INTRAVENOUS

## 2017-09-17 MED ORDER — ALENDRONATE SODIUM 70 MG PO TABS: 70.0000 mg | ORAL_TABLET | ORAL | Status: DC

## 2017-09-17 MED ORDER — VITAMIN D 1000 UNITS PO TABS
2000.0000 [IU] | ORAL_TABLET | Freq: Every day | ORAL | Status: DC
  Administered 2017-09-18 – 2017-09-19 (×2): 2000 [IU] via ORAL
  Filled 2017-09-17 (×2): qty 2

## 2017-09-17 MED ORDER — DONEPEZIL HCL 10 MG PO TABS
10.0000 mg | ORAL_TABLET | Freq: Two times a day (BID) | ORAL | Status: DC
  Administered 2017-09-18 – 2017-09-19 (×3): 10 mg via ORAL
  Filled 2017-09-17 (×3): qty 1

## 2017-09-17 MED ORDER — PAROXETINE HCL 10 MG PO TABS
10.0000 mg | ORAL_TABLET | Freq: Two times a day (BID) | ORAL | Status: DC
  Administered 2017-09-18 – 2017-09-19 (×4): 10 mg via ORAL
  Filled 2017-09-17 (×5): qty 1

## 2017-09-17 NOTE — Progress Notes (Signed)

## 2017-09-17 NOTE — ED Notes (Signed)
Pt was unable to stand for orthostatics. Pt was shaking and unable to bear weight while standing.

## 2017-09-17 NOTE — ED Provider Notes (Signed)
[facility]PART[facility] Provider Note   CSN: [id number] Arrival date & time: 09/17/17  1438     History   Chief Complaint Chief Complaint  Patient presents with  . Loss of Consciousness    HPI Spencer Howard is a 84 y.o. male.  The history is provided by a relative and the spouse. No language interpreter was used.  Loss of Consciousness     Spencer Howard is a 84 y.o. male who presents to the Emergency Department complaining of syncope.  Level V caveat due to confusion. History is provided by the patient's wife and son. He has a history of dementia with frequent falls and gait instability. He has baseline has difficulty getting out of the bed and with ambulation, some days are worse than others. This morning when he got out of the bed he needed extra time getting dressed and was unable to walk [place] to his recliner. He gradually slid to the floor and laid on the floor for 45 minutes before he was able to get back up and into his recliner. He did have an episode of bowel incontinence at that time. This afternoon his wife got him up to take him to the bathroom and he had another episode of sliding down to the bathroom floor. She helped him get to a sitting position on the floor and then at that time he had which she describes as a syncopal episode. She states that he was unresponsive for several minutes up to five minutes. His eyes were open at the time and he was moaning but nonverbal. She states that he was weaker but did have some tone. She tried to arrange him in a more comfortable position and EMS was called. She states that this episode was similar to an episode back in April that brought him into the emergency department. This episode was different and that he lost consciousness today. No recent medication changes or illnesses. No reports of fevers, chest pain, shortness of breath, nausea, vomiting, change in urine quality, diarrhea. Family denies any alcohol or  drug use. Past Medical History:  Diagnosis Date  . Alcohol abuse with alcohol-induced mental disorder ([place])   . Dementia associated with alcoholism ([place])    Alzheimer's dementia versus alcohol related dementia. 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 Depression   . High cholesterol   . Hypertension   . Hypothyroidism (acquired)     Patient Active Problem List   Diagnosis Date Noted  . Altered mental status 09/17/2017  . Anemia 09/17/2017  . Dementia, Alzheimer's, with behavior disturbance 05/29/2017  . Orthostatic hypotension 11/04/2016  . Closed left hand fracture 11/02/2016  . Dementia 11/02/2016  . Syncope 10/31/2016  . DOE (dyspnea on exertion) 03/05/2016  . Abnormal fourth heart sound (S4) 03/05/2016  . Essential hypertension 02/01/2015  . HLD (hyperlipidemia) 02/01/2015  . Depressed 02/01/2015  . Hypothyroidism 02/01/2015    Past Surgical History:  Procedure Laterality Date  . ELBOW SURGERY Left 1995   Broken  . NM MYOVIEW LTD  02/2016   Normal LV function - EF 60-65%. No EKG changes. No ischemia or infarction noted on imaging. LOW RISK  . TONSILLECTOMY  1942  . TRANSTHORACIC ECHOCARDIOGRAM  02/2016   Moderate concentric LVH with EF 55-60%. GR 1 DD. Mild LA dilation. Mildly increased PA pressures  . VARICOCELECTOMY  1967        Home Medications    Prior to Admission medications   Medication  Sig Start Date End Date Taking? Authorizing Provider  alendronate (FOSAMAX) 70 MG tablet Take 70 mg by mouth every Monday.  05/18/15  Yes [provider]  Calcium Citrate-Vitamin D (CALCIUM CITRATE + D3 PO) Take 1 tablet by mouth daily.   Yes [provider]  Cholecalciferol (VITAMIN D) 2000 units tablet Take 2,000 Units by mouth daily.   Yes [provider]  desmopressin (DDAVP) 0.2 MG tablet Take 0.2 mg by mouth at bedtime.   Yes [provider]  donepezil (ARICEPT) 10 MG tablet TAKE 1 TABLET TWICE A DAY  07/06/17  Yes Melvenia Beam, MD  levothyroxine (SYNTHROID, LEVOTHROID) 50 MCG tablet Take 50 mcg by mouth daily before breakfast.   Yes [provider]  memantine (NAMENDA) 5 MG tablet Take 1 tablet (5 mg total) by mouth 2 (two) times daily. 05/29/17  Yes Melvenia Beam, MD  midodrine (PROAMATINE) 10 MG tablet Take 1 tablet (10 mg total) by mouth 3 (three) times daily. 12/21/16  Yes Melvenia Beam, MD  Multiple Vitamins-Minerals (CENTRUM SILVER 50+MEN) TABS Take 1 tablet by mouth daily with breakfast.   Yes [provider]  PARoxetine (PAXIL) 10 MG tablet Take 10 mg by mouth 2 (two) times daily.  06/27/17  Yes [provider]    Family History Family History  Problem Relation Age of Onset  . Leukemia Father   . Colon cancer Mother   . Dementia Neg Hx     Social History Social History   Tobacco Use  . Smoking status: Former Smoker    Last attempt to quit: 03/28/1969    Years since quitting: 48.5  . Smokeless tobacco: Never Used  Substance Use Topics  . Alcohol use: No    Alcohol/week: 0.0 oz    Frequency: Never    Comment: Stopped August 2018  . Drug use: No     Allergies   Patient has no known allergies.   Review of Systems Review of Systems  Unable to perform ROS: Dementia  Cardiovascular: Positive for syncope.     Physical Exam Updated Vital Signs BP (!) 173/78 (BP Location: Left Arm)   Pulse 67   Temp 97.7 F (36.5 C) (Oral)   Resp 18   SpO2 99%   Physical Exam  Constitutional: He appears well-developed and well-nourished.  Disheveled  HENT:  Head: Normocephalic and atraumatic.  Cardiovascular: Normal rate and regular rhythm.  No murmur heard. Pulmonary/Chest: Effort normal and breath sounds normal. No respiratory distress.  Abdominal: Soft. There is no tenderness. There is no rebound and no guarding.  Musculoskeletal: He exhibits no edema or tenderness.  Neurological: He is alert.  Disoriented to place in time. Frequently  asks, "Why am I here?" Five out of five strength in all four extremities but significant ataxia and difficulty coordinating simple movements, such as sitting up in bed.  Skin: Skin is warm and dry.  Psychiatric: He has a normal mood and affect. His behavior is normal.  Nursing note and vitals reviewed.    ED Treatments / Results  Labs (all labs ordered are listed, but only abnormal results are displayed) Labs Reviewed  COMPREHENSIVE METABOLIC PANEL - Abnormal; Notable for the following components:      Result Value   Glucose, Bld 118 (*)    ALT 15 (*)    GFR calc non Af Amer 51 (*)    GFR calc Af Amer 59 (*)    All other components within normal limits  CBC  WITH DIFFERENTIAL/PLATELET - Abnormal; Notable for the following components:   RBC 3.52 (*)    Hemoglobin 12.7 (*)    MCV 113.4 (*)    MCH 36.1 (*)    All other components within normal limits  URINALYSIS, ROUTINE W REFLEX MICROSCOPIC - Abnormal; Notable for the following components:   Color, Urine AMBER (*)    APPearance HAZY (*)    Protein, ur 30 (*)    All other components within normal limits  ETHANOL  TROPONIN I  MAGNESIUM  COMPREHENSIVE METABOLIC PANEL  CBC  TROPONIN I  TROPONIN I  TROPONIN I  TSH  VITAMIN B12  SEDIMENTATION RATE  ANA  RPR    EKG EKG Interpretation  Date/Time:  Sunday September 17 2017 14:59:22 EDT Ventricular Rate:  64 PR Interval:    QRS Duration: 105 QT Interval:  428 QTC Calculation: 442 R Axis:   -13 Text Interpretation:  Sinus rhythm Ventricular premature complex Prolonged PR interval Low voltage, precordial leads Confirmed by Quintella Reichert [phone]) on 09/17/2017 3:24:47 PM   Radiology Dg Chest 2 View  Result Date: 09/17/2017 CLINICAL DATA:  [place]. EXAM: CHEST - 2 VIEW COMPARISON:  07/13/2017 FINDINGS: The cardiac silhouette, mediastinal and hilar contours are within normal limits given the AP projection. There is mild tortuosity and calcification of the thoracic aorta. The lungs  are clear. No pleural effusion or pneumothorax. The bony thorax is intact. No definite rib fractures. Remote left distal clavicle fracture noted. There is a compression fracture of the probable T11 vertebral body which appears progressive since the prior chest x-ray. IMPRESSION: No acute cardiopulmonary findings. Progressive compression fracture of T11. Electronically Signed   By: Marijo Sanes M.D.   On: 09/17/2017 16:54   Ct Head Wo Contrast  Result Date: 09/17/2017 CLINICAL DATA:  84 year old male with history of syncopal episode, found on the floor unconscious. Laceration to the left side of the nose. History of dementia. EXAM: CT HEAD WITHOUT CONTRAST TECHNIQUE: Contiguous axial images were obtained from the base of the skull through the vertex without intravenous contrast. COMPARISON:  Head CT 07/13/2017. FINDINGS: Brain: Moderate cerebral atrophy with some ex vacuo dilatation of the ventricular system. Patchy and confluent areas of decreased attenuation are noted throughout the deep and periventricular white matter of the cerebral hemispheres bilaterally, compatible with chronic microvascular ischemic disease. No evidence of acute infarction, hemorrhage, hydrocephalus, extra-axial collection or mass lesion/mass effect. Vascular: No hyperdense vessel or unexpected calcification. Skull: Normal. Negative for fracture or focal lesion. Sinuses/Orbits: No acute finding. Other: None. IMPRESSION: 1. No acute intracranial abnormalities. 2. Moderate cerebral atrophy with severe chronic microvascular ischemic changes in the cerebral white matter, as above. Electronically Signed   By: Vinnie Langton M.D.   On: 09/17/2017 16:54   Mr Brain Wo Contrast  Result Date: 09/17/2017 CLINICAL DATA:  Initial evaluation for acute altered mental status, syncope. EXAM: MRI HEAD WITHOUT CONTRAST TECHNIQUE: Multiplanar, multiecho pulse sequences of the brain and surrounding structures were obtained without intravenous  contrast. COMPARISON:  Prior CT from earlier the same day as well as previous brain MRI from 07/13/2017. FINDINGS: Brain: Moderate atrophy with chronic small vessel ischemic disease, stable. No abnormal foci of restricted diffusion to suggest acute or subacute ischemia. Gray-white matter differentiation maintained. No areas of remote cortical infarction. No evidence for acute or chronic intracranial hemorrhage. No mass lesion, midline shift or mass effect. Diffuse ventricular prominence related to global parenchymal volume loss without hydrocephalus. No extra-axial fluid collection. Pituitary gland within normal  limits. Subcentimeter simple cyst noted within the pineal gland, of doubtful clinical significance. Vascular: Major intracranial vascular flow voids maintained. Skull and upper cervical spine: Craniocervical junction normal. Upper cervical spine demonstrates no significant finding. Bone marrow signal intensity normal. No scalp soft tissue abnormality. Sinuses/Orbits: Patient status post ocular lens extraction bilaterally. Paranasal sinuses are clear. Trace left mastoid effusion, of doubtful significance. Inner ear structures grossly normal. Other: None. IMPRESSION: 1. No acute intracranial abnormality. 2. Moderate atrophy with chronic small vessel ischemic disease, stable. Electronically Signed   By: Jeannine Boga M.D.   On: 09/17/2017 22:58    Procedures Procedures (including critical care time)  Medications Ordered in ED Medications  calcium-vitamin D (OSCAL WITH D) 500-200 MG-UNIT per tablet 1 tablet (has no administration in time range)  cholecalciferol (VITAMIN D) tablet 2,000 Units (has no administration in time range)  desmopressin (DDAVP) tablet 0.2 mg (has no administration in time range)  donepezil (ARICEPT) tablet 10 mg (has no administration in time range)  levothyroxine (SYNTHROID, LEVOTHROID) tablet 50 mcg (has no administration in time range)  memantine (NAMENDA) tablet 5  mg (has no administration in time range)  midodrine (PROAMATINE) tablet 10 mg (has no administration in time range)  multivitamin with minerals tablet 1 tablet (has no administration in time range)  PARoxetine (PAXIL) tablet 10 mg (has no administration in time range)  enoxaparin (LOVENOX) injection 40 mg (has no administration in time range)  acetaminophen (TYLENOL) tablet 650 mg (has no administration in time range)    Or  acetaminophen (TYLENOL) suppository 650 mg (has no administration in time range)  0.9 %  sodium chloride infusion (has no administration in time range)  sodium chloride 0.9 % bolus 500 mL (0 mLs Intravenous Stopped 09/17/17 1923)     Initial Impression / Assessment and Plan / ED Course  I have reviewed the triage vital signs and the nursing notes.  Pertinent labs & imaging results that were available during my care of the patient were reviewed by me and considered in my medical decision making (see chart for details).     Patient here following is syncopal event. He has a history of dementia and resides at home with his wife. He is very ataxic but strong on examination with no focal deficits. He is unable to sit up or stand without assistance as he is unable to coordinate his movements. Medicine consulted for observation regarding syncope and inability to ambulate.  Records reviewed from prior ED visit. No evidence of acute electrolyte abnormality, anemia or infectious process. Presentation is not consistent with CVA.  Final Clinical Impressions(s) / ED Diagnoses   Final diagnoses:  None    ED Discharge Orders    None       Quintella Reichert, MD 09/17/17 2335

## 2017-09-17 NOTE — H&P (Signed)
TRH H&P   Patient Demographics:    Spencer Howard, is a 84 y.o. male  MRN: [id number]   DOB - 1928-07-08  Admit Date - 09/17/2017  Outpatient Primary MD for the patient is Tisovec, Fransico Him, MD  Referring MD/NP/PA:  Dr. Ralene Bathe  Outpatient Specialists:     Patient coming from: home  Chief Complaint  Patient presents with  . Loss of Consciousness      HPI:    Spencer Howard  is a 84 y.o. male, w hypothyroidism, hypertension, hyperlipidemia, etoh abuse, depression, Dementia apparently presents with weakness earlier today. He was trying to get to the bathroom and had to lie down twice, and then became unresponsive while lying down per his wife for a few <5 minutes.    In ED,   CT brain IMPRESSION: 1. No acute intracranial abnormalities. 2. Moderate cerebral atrophy with severe chronic microvascular ischemic changes in the cerebral white matter, as above.  CXR IMPRESSION: No acute cardiopulmonary findings.  Progressive compression fracture of T11.  Na 135, K 4.2, Bun 15, Creatinine 1.24 Ast 32, Alt 15  Alk phos 70, T. Bili 0.9  Wbc 5.7, Hgb 12.7, Plt 184  ETOH <10 Magnesium 2.2 Trop <0.03 Urinalysis negative  Pt will be admitted for AMS , unresponsiveness        Review of systems:    In addition to the HPI above,  No Fever-chills, No Headache, No changes with Vision or hearing, No problems swallowing food or Liquids, No Chest pain, Cough or Shortness of Breath, No Abdominal pain, No Nausea or Vommitting, Bowel movements are regular, No Blood in stool or Urine, No dysuria, No new skin rashes or bruises, No new joints pains-aches,  No new weakness, tingling, numbness in any extremity, No recent weight gain or loss, No polyuria, polydypsia or polyphagia, No significant Mental Stressors.  A full 10 point Review of Systems was done, except as stated  above, all other Review of Systems were negative.   With Past History of the following :    Past Medical History:  Diagnosis Date  . Alcohol abuse with alcohol-induced mental disorder ([place])   . Dementia associated with alcoholism ([place])    Alzheimer's dementia versus alcohol related dementia. 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 Depression   . High cholesterol   . Hypertension   . Hypothyroidism (acquired)       Past Surgical History:  Procedure Laterality Date  . ELBOW SURGERY Left 1995   Broken  . NM MYOVIEW LTD  02/2016   Normal LV function - EF 60-65%. No EKG changes. No ischemia or infarction noted on imaging. LOW RISK  . TONSILLECTOMY  1942  . TRANSTHORACIC ECHOCARDIOGRAM  02/2016   Moderate concentric LVH with EF 55-60%. GR 1 DD. Mild LA dilation. Mildly increased PA pressures  . VARICOCELECTOMY  [id number]  Social History:     Social History   Tobacco Use  . Smoking status: Former Smoker    Last attempt to quit: 03/28/1969    Years since quitting: 48.5  . Smokeless tobacco: Never Used  Substance Use Topics  . Alcohol use: No    Alcohol/week: 0.0 oz    Frequency: Never    Comment: Stopped August 2018     Lives - at home  Mobility - walks by self   Family History :     Family History  Problem Relation Age of Onset  . Leukemia Father   . Colon cancer Mother   . Dementia Neg Hx      Home Medications:   Prior to Admission medications   Medication Sig Start Date End Date Taking? Authorizing Provider  alendronate (FOSAMAX) 70 MG tablet Take 70 mg by mouth every Monday.  05/18/15  Yes [provider]  Calcium Citrate-Vitamin D (CALCIUM CITRATE + D3 PO) Take 1 tablet by mouth daily.   Yes [provider]  Cholecalciferol (VITAMIN D) 2000 units tablet Take 2,000 Units by mouth daily.   Yes [provider]  desmopressin (DDAVP) 0.2 MG tablet Take 0.2 mg by mouth at bedtime.   Yes [provider]  donepezil (ARICEPT) 10 MG tablet TAKE 1 TABLET TWICE A DAY 07/06/17  Yes Melvenia Beam, MD  levothyroxine (SYNTHROID, LEVOTHROID) 50 MCG tablet Take 50 mcg by mouth daily before breakfast.   Yes [provider]  memantine (NAMENDA) 5 MG tablet Take 1 tablet (5 mg total) by mouth 2 (two) times daily. 05/29/17  Yes Melvenia Beam, MD  midodrine (PROAMATINE) 10 MG tablet Take 1 tablet (10 mg total) by mouth 3 (three) times daily. 12/21/16  Yes Melvenia Beam, MD  Multiple Vitamins-Minerals (CENTRUM SILVER 50+MEN) TABS Take 1 tablet by mouth daily with breakfast.   Yes [provider]  PARoxetine (PAXIL) 10 MG tablet Take 10 mg by mouth 2 (two) times daily.  06/27/17  Yes [provider]     Allergies:    No Known Allergies   Physical Exam:   Vitals  Blood pressure (!) 167/63, pulse 67, resp. rate 16, SpO2 95 %.   1. General  lying in bed in NAD,    2. Normal affect and insight, Not Suicidal or Homicidal, Awake Alert, Oriented X 1.  3. No F.N deficits, ALL C.Nerves Intact, Strength 5/5 all 4 extremities, Sensation intact all 4 extremities, Plantars down going.  4. Ears and Eyes appear Normal, Conjunctivae clear, PERRLA. Moist Oral Mucosa.  5. Supple Neck, No JVD, No cervical lymphadenopathy appriciated, No Carotid Bruits.  6. Symmetrical Chest wall movement, Good air movement bilaterally, CTAB.  7. RRR, No Gallops, Rubs or Murmurs, No Parasternal Heave.  8. Positive Bowel Sounds, Abdomen Soft, No tenderness, No organomegaly appriciated,No rebound -guarding or rigidity.  9.  No Cyanosis, Normal Skin Turgor, No Skin Rash or Bruise.  10. Good muscle tone,  joints appear normal , no effusions, Normal ROM.  11. No Palpable Lymph Nodes in Neck or Axillae      Data Review:    CBC Recent Labs  Lab 09/17/17 1552  WBC 5.7  HGB 12.7*  HCT 39.9  PLT 184  MCV 113.4*  MCH 36.1*  MCHC 31.8  RDW 14.7  LYMPHSABS 0.9  MONOABS 0.8    EOSABS 0.2  BASOSABS 0.0   ------------------------------------------------------------------------------------------------------------------  Chemistries  Recent Labs  Lab 09/17/17 1552  NA 136  K 4.2  CL 101  CO2 24  GLUCOSE 118*  BUN 15  CREATININE 1.24  CALCIUM 9.3  MG 2.2  AST 32  ALT 15*  ALKPHOS 70  BILITOT 0.9   ------------------------------------------------------------------------------------------------------------------ CrCl cannot be calculated (Unknown ideal weight.). ------------------------------------------------------------------------------------------------------------------ No results for input(s): TSH, T4TOTAL, T3FREE, THYROIDAB in the last 72 hours.  Invalid input(s): FREET3  Coagulation profile No results for input(s): INR, PROTIME in the last 168 hours. ------------------------------------------------------------------------------------------------------------------- No results for input(s): DDIMER in the last 72 hours. -------------------------------------------------------------------------------------------------------------------  Cardiac Enzymes Recent Labs  Lab 09/17/17 1552  TROPONINI <0.03   ------------------------------------------------------------------------------------------------------------------ No results found for: BNP   ---------------------------------------------------------------------------------------------------------------  Urinalysis    Component Value Date/Time   COLORURINE AMBER (A) 09/17/2017 1700   APPEARANCEUR HAZY (A) 09/17/2017 1700   LABSPEC 1.019 09/17/2017 1700   PHURINE 6.0 09/17/2017 1700   GLUCOSEU NEGATIVE 09/17/2017 1700   HGBUR NEGATIVE 09/17/2017 1700   BILIRUBINUR NEGATIVE 09/17/2017 1700   KETONESUR NEGATIVE 09/17/2017 1700   PROTEINUR 30 (A) 09/17/2017 1700   NITRITE NEGATIVE 09/17/2017 1700   LEUKOCYTESUR NEGATIVE 09/17/2017 1700     ----------------------------------------------------------------------------------------------------------------   Imaging Results:    Dg Chest 2 View  Result Date: 09/17/2017 CLINICAL DATA:  [place]. EXAM: CHEST - 2 VIEW COMPARISON:  07/13/2017 FINDINGS: The cardiac silhouette, mediastinal and hilar contours are within normal limits given the AP projection. There is mild tortuosity and calcification of the thoracic aorta. The lungs are clear. No pleural effusion or pneumothorax. The bony thorax is intact. No definite rib fractures. Remote left distal clavicle fracture noted. There is a compression fracture of the probable T11 vertebral body which appears progressive since the prior chest x-ray. IMPRESSION: No acute cardiopulmonary findings. Progressive compression fracture of T11. Electronically Signed   By: Marijo Sanes M.D.   On: 09/17/2017 16:54   Ct Head Wo Contrast  Result Date: 09/17/2017 CLINICAL DATA:  84 year old male with history of syncopal episode, found on the floor unconscious. Laceration to the left side of the nose. History of dementia. EXAM: CT HEAD WITHOUT CONTRAST TECHNIQUE: Contiguous axial images were obtained from the base of the skull through the vertex without intravenous contrast. COMPARISON:  Head CT 07/13/2017. FINDINGS: Brain: Moderate cerebral atrophy with some ex vacuo dilatation of the ventricular system. Patchy and confluent areas of decreased attenuation are noted throughout the deep and periventricular white matter of the cerebral hemispheres bilaterally, compatible with chronic microvascular ischemic disease. No evidence of acute infarction, hemorrhage, hydrocephalus, extra-axial collection or mass lesion/mass effect. Vascular: No hyperdense vessel or unexpected calcification. Skull: Normal. Negative for fracture or focal lesion. Sinuses/Orbits: No acute finding. Other: None. IMPRESSION: 1. No acute intracranial abnormalities. 2. Moderate cerebral atrophy with severe  chronic microvascular ischemic changes in the cerebral white matter, as above. Electronically Signed   By: Vinnie Langton M.D.   On: 09/17/2017 16:54    nsr at 54, nl axis, no st-t changes c/w ischemia   Assessment & Plan:    Principal Problem:   Altered mental status Active Problems:   Essential hypertension   Hypothyroidism   Anemia    AMS/ unresponsiveness  ? Worsening dementia No syncope per wife Tele Trop I q6h x3 Check MRI brain Check EEG Check b12, folate, esr, ana, tsh, rpr  Dementia Cont aricept 81m po qday Cont Namenda 554mpo bid  Anxiety/ depression Cont Paxil 10108mo bid  H/o compression fracture T12 Cont Fosamax, consider Prolia or Forteo as outpatient  Hypothyroidism Cont levothyroxine 50 micrograms  po qday Check TSH as above  Diabetes Insipidus, vs nocturnal enuresis Cont DDAVP  ? Orthostatic hypotension Cont midodrine 67m po tid  Addendum Hypothyroidism, TSH 7.4 Will increase levothyroxine to 75 micorgrams po qday   DVT Prophylaxis  Lovenox - SCDs  AM Labs Ordered, also please review Full Orders  Family Communication: Admission, patients condition and plan of care including tests being ordered have been discussed with the patient and wife who indicate understanding and agree with the plan and Code Status.  Code Status  FULL CODE  Likely DC to   Home  Condition GUARDED    Consults called: none  Admission status: observation   Time spent in minutes :60   JJani GravelM.D on 09/17/2017 at 9:16 PM  Between 7am to 7pm - Pager - [phone]  After 7pm go to [email or web address] - password [email or web address] - Office  [phone]

## 2017-09-17 NOTE — ED Notes (Signed)
Attempted report x1. 

## 2017-09-17 NOTE — ED Notes (Signed)
Pt. Transported to MRI 

## 2017-09-17 NOTE — ED Notes (Signed)
Pt ambulated with assistance from [place], EMT and Chrissie Noa, EMT. Very unstable and weak in the legs, stating "my legs feel very wobbly and like they are going to give."

## 2017-09-17 NOTE — ED Notes (Signed)
Patient transported to CT 

## 2017-09-17 NOTE — ED Triage Notes (Signed)
Pt brought in by [facility] from home for syncopal episode. Pt found in bathroom unconscious by wife. Pt A+Ox1 on EMS arrival. Pt has hx of dementia and htn. Pt has no complaints at this time. Small laceration noted to the left of pt nose. Pt in NAD at this time.

## 2017-09-18 ENCOUNTER — Observation Stay (HOSPITAL_COMMUNITY): Payer: Medicare Other

## 2017-09-18 DIAGNOSIS — M4854XA Collapsed vertebra, not elsewhere classified, thoracic region, initial encounter for fracture: Secondary | ICD-10-CM | POA: Diagnosis present

## 2017-09-18 DIAGNOSIS — F0150 Vascular dementia without behavioral disturbance: Secondary | ICD-10-CM | POA: Diagnosis present

## 2017-09-18 DIAGNOSIS — Z515 Encounter for palliative care: Secondary | ICD-10-CM | POA: Diagnosis not present

## 2017-09-18 DIAGNOSIS — R404 Transient alteration of awareness: Secondary | ICD-10-CM

## 2017-09-18 DIAGNOSIS — R296 Repeated falls: Secondary | ICD-10-CM | POA: Diagnosis present

## 2017-09-18 DIAGNOSIS — R29702 NIHSS score 2: Secondary | ICD-10-CM | POA: Diagnosis present

## 2017-09-18 DIAGNOSIS — I10 Essential (primary) hypertension: Secondary | ICD-10-CM

## 2017-09-18 DIAGNOSIS — F0280 Dementia in other diseases classified elsewhere without behavioral disturbance: Secondary | ICD-10-CM | POA: Diagnosis not present

## 2017-09-18 DIAGNOSIS — R531 Weakness: Secondary | ICD-10-CM | POA: Diagnosis present

## 2017-09-18 DIAGNOSIS — Z8659 Personal history of other mental and behavioral disorders: Secondary | ICD-10-CM | POA: Diagnosis not present

## 2017-09-18 DIAGNOSIS — G9340 Encephalopathy, unspecified: Secondary | ICD-10-CM

## 2017-09-18 DIAGNOSIS — Z7983 Long term (current) use of bisphosphonates: Secondary | ICD-10-CM | POA: Diagnosis not present

## 2017-09-18 DIAGNOSIS — R402413 Glasgow coma scale score 13-15, at hospital admission: Secondary | ICD-10-CM | POA: Diagnosis present

## 2017-09-18 DIAGNOSIS — R4189 Other symptoms and signs involving cognitive functions and awareness: Secondary | ICD-10-CM

## 2017-09-18 DIAGNOSIS — W19XXXA Unspecified fall, initial encounter: Secondary | ICD-10-CM | POA: Diagnosis not present

## 2017-09-18 DIAGNOSIS — E039 Hypothyroidism, unspecified: Secondary | ICD-10-CM

## 2017-09-18 DIAGNOSIS — E232 Diabetes insipidus: Secondary | ICD-10-CM | POA: Diagnosis present

## 2017-09-18 DIAGNOSIS — D649 Anemia, unspecified: Secondary | ICD-10-CM | POA: Diagnosis present

## 2017-09-18 DIAGNOSIS — R400 Somnolence: Secondary | ICD-10-CM

## 2017-09-18 DIAGNOSIS — R159 Full incontinence of feces: Secondary | ICD-10-CM | POA: Diagnosis present

## 2017-09-18 DIAGNOSIS — R269 Unspecified abnormalities of gait and mobility: Secondary | ICD-10-CM | POA: Diagnosis present

## 2017-09-18 DIAGNOSIS — Z8679 Personal history of other diseases of the circulatory system: Secondary | ICD-10-CM | POA: Diagnosis not present

## 2017-09-18 DIAGNOSIS — R4182 Altered mental status, unspecified: Secondary | ICD-10-CM | POA: Diagnosis not present

## 2017-09-18 DIAGNOSIS — R55 Syncope and collapse: Secondary | ICD-10-CM | POA: Diagnosis present

## 2017-09-18 DIAGNOSIS — Z7401 Bed confinement status: Secondary | ICD-10-CM | POA: Diagnosis not present

## 2017-09-18 DIAGNOSIS — M2550 Pain in unspecified joint: Secondary | ICD-10-CM | POA: Diagnosis not present

## 2017-09-18 DIAGNOSIS — Z87891 Personal history of nicotine dependence: Secondary | ICD-10-CM | POA: Diagnosis not present

## 2017-09-18 DIAGNOSIS — N3944 Nocturnal enuresis: Secondary | ICD-10-CM | POA: Diagnosis present

## 2017-09-18 DIAGNOSIS — Z79899 Other long term (current) drug therapy: Secondary | ICD-10-CM | POA: Diagnosis not present

## 2017-09-18 DIAGNOSIS — F419 Anxiety disorder, unspecified: Secondary | ICD-10-CM | POA: Diagnosis present

## 2017-09-18 DIAGNOSIS — Z66 Do not resuscitate: Secondary | ICD-10-CM | POA: Diagnosis present

## 2017-09-18 DIAGNOSIS — F1020 Alcohol dependence, uncomplicated: Secondary | ICD-10-CM | POA: Diagnosis present

## 2017-09-18 DIAGNOSIS — F329 Major depressive disorder, single episode, unspecified: Secondary | ICD-10-CM | POA: Diagnosis present

## 2017-09-18 LAB — CBC
HCT: 33.8 % — ABNORMAL LOW (ref 39.0–52.0)
Hemoglobin: 11.1 g/dL — ABNORMAL LOW (ref 13.0–17.0)
MCH: 35.6 pg — AB (ref 26.0–34.0)
MCHC: 32.8 g/dL (ref 30.0–36.0)
MCV: 108.3 fL — AB (ref 78.0–100.0)
PLATELETS: 176 10*3/uL (ref 150–400)
RBC: 3.12 MIL/uL — ABNORMAL LOW (ref 4.22–5.81)
RDW: 14.7 % (ref 11.5–15.5)
WBC: 5.3 10*3/uL (ref 4.0–10.5)

## 2017-09-18 LAB — TROPONIN I
Troponin I: <0.03 ng/mL (ref <0.03)
Troponin I: <0.03 ng/mL (ref <0.03)
Troponin I: <0.03 ng/mL (ref <0.03)

## 2017-09-18 LAB — COMPREHENSIVE METABOLIC PANEL
ALT: 14 U/L — AB (ref 17–63)
AST: 27 U/L (ref 15–41)
Albumin: 3.4 g/dL — ABNORMAL LOW (ref 3.5–5.0)
Alkaline Phosphatase: 57 U/L (ref 38–126)
Anion gap: 7 (ref 5–15)
BUN: 13 mg/dL (ref 6–20)
CHLORIDE: 105 mmol/L (ref 101–111)
CO2: 26 mmol/L (ref 22–32)
CREATININE: 1.07 mg/dL (ref 0.61–1.24)
Calcium: 8.9 mg/dL (ref 8.9–10.3)
Glucose, Bld: 97 mg/dL (ref 65–99)
Potassium: 3.5 mmol/L (ref 3.5–5.1)
SODIUM: 138 mmol/L (ref 135–145)
Total Bilirubin: 1.1 mg/dL (ref 0.3–1.2)
Total Protein: 6 g/dL — ABNORMAL LOW (ref 6.5–8.1)

## 2017-09-18 LAB — TSH: TSH: 7.461 u[IU]/mL — AB (ref 0.350–4.500)

## 2017-09-18 LAB — VITAMIN B12: Vitamin B-12: 619 pg/mL (ref 180–914)

## 2017-09-18 LAB — SEDIMENTATION RATE: Sed Rate: 14 mm/hr (ref 0–16)

## 2017-09-18 LAB — RPR: RPR: NONREACTIVE

## 2017-09-18 MED ORDER — LEVOTHYROXINE SODIUM 75 MCG PO TABS
75.0000 ug | ORAL_TABLET | Freq: Every day | ORAL | Status: DC
  Administered 2017-09-18 – 2017-09-19 (×2): 75 ug via ORAL
  Filled 2017-09-18 (×2): qty 1

## 2017-09-18 NOTE — Procedures (Signed)
HPI:  84 y/o with MS change  TECHNICAL SUMMARY:  A multichannel referential and bipolar montage EEG using the standard international 10-20 system was performed on the patient described as awake and drowsy.  The dominant background activity consists of 8 to 9 hertz activity seen most prominantly over the posterior head region.  The backgound activity is reactive to eye opening and closing procedures.  Low voltage fast (beta) activity is distributed symmetrically and maximally over the anterior head regions.  ACTIVATION:  Stepwise photic stimulation and hyperventilation were not performed.  EPILEPTIFORM ACTIVITY:  There were no spikes, sharp waves or paroxysmal activity.  SLEEP: Physiologic drowsiness is noted but no stage II sleep.   IMPRESSION:  This is a normal EEG for the patients stated age.  There were no focal, hemispheric or lateralizing features.  No epileptiform activity was recorded.  A normal EEG does not exclude the diagnosis of a seizure disorder and if seizure remains high on the list of differential diagnosis, an ambulatory EEG may be of value.  Clinical correlation is required.

## 2017-09-18 NOTE — NC FL2 (Signed)
[place] LEVEL OF CARE SCREENING TOOL     IDENTIFICATION  Patient Name: Spencer Howard Birthdate: 1928-10-10 Sex: male Admission Date (Current Location): 09/17/2017  [facility] and [place] Number:  Herbalist and Address:  [facility], [place] [address], [place], [place] [address]      Provider Number: [id number]  Attending Physician Name and Address:  Geradine Girt, DO  Relative Name and Phone Number:       Current Level of Care: Hospital Recommended Level of Care: [place] Prior Approval Number:    Date Approved/Denied:   PASRR Number: pending  Discharge Plan: SNF    Current Diagnoses: Patient Active Problem List   Diagnosis Date Noted  . Fall   . Unresponsive episode   . Altered mental status 09/17/2017  . Anemia 09/17/2017  . Dementia, Alzheimer's, with behavior disturbance 05/29/2017  . Orthostatic hypotension 11/04/2016  . Closed left hand fracture 11/02/2016  . Dementia 11/02/2016  . Syncope 10/31/2016  . DOE (dyspnea on exertion) 03/05/2016  . Abnormal fourth heart sound (S4) 03/05/2016  . Essential hypertension 02/01/2015  . HLD (hyperlipidemia) 02/01/2015  . Depressed 02/01/2015  . Hypothyroidism 02/01/2015    Orientation RESPIRATION BLADDER Height & Weight     Self, Situation, Place, Time  Normal Incontinent Weight: 182 lb 1.6 oz (82.6 kg) Height:     BEHAVIORAL SYMPTOMS/MOOD NEUROLOGICAL BOWEL NUTRITION STATUS      Continent Diet(Heart healthy, thin liquids)  AMBULATORY STATUS COMMUNICATION OF NEEDS Skin   Extensive Assist Verbally Normal                       Personal Care Assistance Level of Assistance  Bathing, Feeding, Dressing Bathing Assistance: Maximum assistance Feeding assistance: Independent Dressing Assistance: Maximum assistance     Functional Limitations Info  Sight, Hearing, Speech Sight Info: Adequate Hearing Info: Adequate Speech Info: Adequate     SPECIAL CARE FACTORS FREQUENCY  PT (By licensed PT), OT (By licensed OT)     PT Frequency: 2x OT Frequency: 2x            Contractures Contractures Info: Not present    Additional Factors Info  Code Status, Allergies Code Status Info: DNR Allergies Info: No known allergies           Current Medications (09/18/2017):  This is the current hospital active medication list Current Facility-Administered Medications  Medication Dose Route Frequency Provider Last Rate Last Dose  . acetaminophen (TYLENOL) tablet 650 mg  650 mg Oral Q6H PRN Jani Gravel, MD       Or  . acetaminophen (TYLENOL) suppository 650 mg  650 mg Rectal Q6H PRN Jani Gravel, MD      . calcium-vitamin D (OSCAL WITH D) 500-200 MG-UNIT per tablet 1 tablet  1 tablet Oral Q breakfast Jani Gravel, MD   1 tablet at 09/18/17 1227  . cholecalciferol (VITAMIN D) tablet 2,000 Units  2,000 Units Oral Daily Jani Gravel, MD   2,000 Units at 09/18/17 1227  . desmopressin (DDAVP) tablet 0.2 mg  0.2 mg Oral QHS Jani Gravel, MD   0.2 mg at 09/18/17 0050  . donepezil (ARICEPT) tablet 10 mg  10 mg Oral BID Jani Gravel, MD   10 mg at 09/18/17 1228  . enoxaparin (LOVENOX) injection 40 mg  40 mg Subcutaneous Daily Jani Gravel, MD   40 mg at 09/18/17 1230  . levothyroxine (SYNTHROID, LEVOTHROID) tablet 75 mcg  75  mcg Oral QAC breakfast Jani Gravel, MD   75 mcg at 09/18/17 0705  . memantine (NAMENDA) tablet 5 mg  5 mg Oral BID Jani Gravel, MD   5 mg at 09/18/17 1229  . midodrine (PROAMATINE) tablet 10 mg  10 mg Oral TID WC Jani Gravel, MD   10 mg at 09/18/17 1701  . multivitamin with minerals tablet 1 tablet  1 tablet Oral Q breakfast Jani Gravel, MD   1 tablet at 09/18/17 1227  . PARoxetine (PAXIL) tablet 10 mg  10 mg Oral BID Jani Gravel, MD   10 mg at 09/18/17 1229     Discharge Medications: Please see discharge summary for a list of discharge medications.  Relevant Imaging Results:  Relevant Lab Results:   Additional Information SSN:  [id number]  Eileen Stanford, LCSW

## 2017-09-18 NOTE — Progress Notes (Signed)
EEG Completed; Results Pending  

## 2017-09-18 NOTE — Care Management Note (Signed)
Case Management Note  Patient Details  Name: Spencer Howard MRN: [id number] Date of Birth: 1928-07-13  Subjective/Objective:     Pt in with altered mental status. He is from home with his spouse.               Action/Plan: PT/OT recommending SNF. Pt currently under observation status and unsure he will remain for 3 nights as inpatient. CM inquired with the spouse about [facility] through [place]. Wife is interested and would like to speak to [place] about the program. Tommi Rumps notified and will reach out to the spouse.  Wife also asking that patient be faxed out in [facility] understanding that it may be private pay. CSW updated.   Expected Discharge Date:                  Expected Discharge Plan:  Skilled Nursing Facility  In-House Referral:  Clinical Social Work  Discharge planning Services  CM Consult  Post Acute Care Choice:    Choice offered to:     DME Arranged:    DME Agency:     HH Arranged:    [place] Agency:     Status of Service:  In process, will continue to follow  If discussed at Long Length of Stay Meetings, dates discussed:    Additional Comments:  Pollie Friar, RN 09/18/2017, 3:18 PM

## 2017-09-18 NOTE — Evaluation (Signed)
Physical Therapy Evaluation Patient Details Name: Spencer Howard MRN: [id number] DOB: [birth year] Today's Date: 09/18/2017   History of Present Illness  Pt is an 84 y/o male admitted secondary to AMS and falls at home. MRI and CT negative for acute abnormality. Chest imaging revealed progressive compression fx of T11. PMH incldues HTN, etoh abuse, and dementia.   Clinical Impression  Pt admitted secondary to problem above with deficits below. Pt very unsteady and demonstrating bouncing type movements in BLE during transfer to chair. Reliant on mod A and UE support throughout transfer. Further mobility limited secondary to decreased safety with transfer. Pt's wife very concerned about taking pt home given current mobility deficits. Feel pt is at an increased risk for falls and will have difficulty performing mobility tasks at home. Will continue to follow acutely to maximize functional mobility independence and safety.     Follow Up Recommendations SNF;Supervision/Assistance - 24 hour    Equipment Recommendations  None recommended by PT    Recommendations for Other Services OT consult     Precautions / Restrictions Precautions Precautions: Fall Precaution Comments: Pt's wife reports multiple falls at home.  Restrictions Weight Bearing Restrictions: No      Mobility  Bed Mobility Overal bed mobility: Needs Assistance Bed Mobility: Supine to Sit     Supine to sit: Mod assist     General bed mobility comments: Mod A for trunk elevation.   Transfers Overall transfer level: Needs assistance Equipment used: Rolling walker (2 wheeled) Transfers: Sit to/from [facility] Sit to Stand: Mod assist;From elevated surface Stand pivot transfers: Mod assist       General transfer comment: Mod A for lift assist and steadying assist. Pt very shaky on initial attempt to stand and required seated rest. On second attempt able to stand and perform stand pivot to recliner. Pt with  bouncing type movements in LEs and very unsteady throughout. Pt also with flexed posture and unable to correct with cues. Cues for sequencing with RW required.   Ambulation/Gait             General Gait Details: Deferred  Stairs            Wheelchair Mobility    Modified Rankin (Stroke Patients Only)       Balance Overall balance assessment: Needs assistance Sitting-balance support: No upper extremity supported;Feet supported Sitting balance-Leahy Scale: Fair     Standing balance support: Bilateral upper extremity supported;During functional activity Standing balance-Leahy Scale: Poor Standing balance comment: Reliant on BUE support and external support.                              Pertinent Vitals/Pain Pain Assessment: No/denies pain    Home Living Family/patient expects to be discharged to:: Private residence Living Arrangements: Spouse/significant other Available Help at Discharge: Family;Available 24 hours/day Type of Home: House Home Access: Stairs to enter Entrance Stairs-Rails: Right Entrance Stairs-Number of Steps: 2 Home Layout: One level Home Equipment: Clinical cytogeneticist - 4 wheels;Cane - single point;Grab bars - tub/shower      Prior Function Level of Independence: Needs assistance   Gait / Transfers Assistance Needed: Wife reports she would help pt ambulate with use of RW. Recently pt has had multiple falls at home.  ADL's / Homemaking Assistance Needed: Wife reports she assists with ADL/IADL tasks.         Hand Dominance   Dominant Hand: Left    Extremity/Trunk Assessment  Upper Extremity Assessment Upper Extremity Assessment: Defer to OT evaluation    Lower Extremity Assessment Lower Extremity Assessment: Generalized weakness    Cervical / Trunk Assessment Cervical / Trunk Assessment: Kyphotic  Communication   Communication: No difficulties  Cognition Arousal/Alertness: Awake/alert Behavior During Therapy: WFL  for tasks assessed/performed Overall Cognitive Status: History of cognitive impairments - at baseline                                 General Comments: Dementia at baseline       General Comments General comments (skin integrity, edema, etc.): Pt's wife present during session and very concerned about taking pt home given mobility deficits.     Exercises     Assessment/Plan    PT Assessment Patient needs continued PT services  PT Problem List Decreased strength;Decreased balance;Decreased mobility;Decreased cognition;Decreased coordination;Decreased knowledge of use of DME;Decreased safety awareness;Decreased knowledge of precautions       PT Treatment Interventions DME instruction;Gait training;Functional mobility training;Therapeutic activities;Therapeutic exercise;Balance training;Patient/family education    PT Goals (Current goals can be found in the Care Plan section)  Acute Rehab PT Goals Patient Stated Goal: to get stronger  PT Goal Formulation: With patient Time For Goal Achievement: 10/02/17 Potential to Achieve Goals: Good    Frequency Min 2X/week   Barriers to discharge        Co-evaluation               AM-PAC PT "6 Clicks" Daily Activity  Outcome Measure Difficulty turning over in bed (including adjusting bedclothes, sheets and blankets)?: A Lot Difficulty moving from lying on back to sitting on the side of the bed? : Unable Difficulty sitting down on and standing up from a chair with arms (e.g., wheelchair, bedside commode, etc,.)?: Unable Help needed moving to and from a bed to chair (including a wheelchair)?: A Lot Help needed walking in hospital room?: A Lot Help needed climbing 3-5 steps with a railing? : Total 6 Click Score: 9    End of Session Equipment Utilized During Treatment: Gait belt Activity Tolerance: Patient tolerated treatment well Patient left: in chair;with call bell/phone within reach;with chair alarm set;with  family/visitor present Nurse Communication: Mobility status PT Visit Diagnosis: Unsteadiness on feet (R26.81);Muscle weakness (generalized) (M62.81);History of falling (Z91.81)    Time: 1610-9604 PT Time Calculation (min) (ACUTE ONLY): 23 min   Charges:   PT Evaluation $PT Eval Moderate Complexity: 1 Mod PT Treatments $Therapeutic Activity: 8-22 mins   PT G Codes:        Leighton Ruff, PT, DPT  Acute Rehabilitation Services  Pager: [phone]   Rudean Hitt 09/18/2017, 10:56 AM

## 2017-09-18 NOTE — Clinical Social Work Note (Signed)
Clinical Social Work Assessment  Patient Details  Name: Spencer Howard MRN: [id number] Date of Birth: [birth year]  Date of referral:  09/18/17               Reason for consult:  Facility Placement                Permission sought to share information with:  Family Supports Permission granted to share information::  Yes, Release of Information Signed  Name::     Lexicographer::     Relationship::  SPouse  Contact Information:     Housing/Transportation Living arrangements for the past 2 months:  [place] of Information:  Patient, Spouse Patient Interpreter Needed:  None Criminal Activity/Legal Involvement Pertinent to Current Situation/Hospitalization:  No - Comment as needed Significant Relationships:  Spouse Lives with:  Spouse Do you feel safe going back to the place where you live?  No Need for family participation in patient care:  No (Coment)  Care giving concerns:  According to RN pt has altered mental status and short term memory loss. Pt's spouse present at bedside. Pt is from home with spouse--no additional caregiver noted at this time.   Social Worker assessment / plan:  CSW spoke with pt's spouse at bedside. Pt's spouse is trying to decide between home first of SNF at d/c. Pt's spouse agreeable to [facility] fax out. CSW will follow up with bed availability.   Employment status:  Retired Forensic scientist:  Medicare PT Recommendations:  [place] / Referral to community resources:  [place]  Patient/Family's Response to care:  Pt's spouse verbalized understanding of CSW role and expressed appreciation for support. Pt's spouse denies any concern regarding pt care at this time.   Patient/Family's Understanding of and Emotional Response to Diagnosis, Current Treatment, and Prognosis:  Pt's spouse understanding and realistic regarding pt's physical limitations. Pt 's spouse understands the need for pt to  get therapies following d/c--pt's spouse agreeable. Pt's spouse denies any concern regarding pt's treatment plan at this time. CSW will continue to provide support and facilitate d/c needs.   Emotional Assessment Appearance:  Appears stated age Attitude/Demeanor/Rapport:  (Patient was appropriate. Pt has short term memory loss, altered mental status) Affect (typically observed):  Accepting, Appropriate Orientation:  Oriented to Self, Oriented to Place, Oriented to  Time, Oriented to Situation Alcohol / Substance use:  Not Applicable Psych involvement (Current and /or in the community):  No (Comment)  Discharge Needs  Concerns to be addressed:  Care Coordination, Basic Needs Readmission within the last 30 days:  No Current discharge risk:  Dependent with Mobility Barriers to Discharge:  Continued Medical Work up   [employer], LCSW 09/18/2017, 5:00 PM

## 2017-09-18 NOTE — Progress Notes (Signed)
Progress Note    Spencer Howard  DGU:[id number] DOB: Apr 01, 1928  DOA: 09/17/2017 PCP: Haywood Pao, MD    Brief Narrative:    Medical records reviewed and are as summarized below:  Spencer Howard is an 84 y.o. male w hypothyroidism, hypertension, hyperlipidemia, etoh abuse, depression, Dementia apparently presents with weakness earlier today. He was trying to get to the bathroom and had to lie down twice, and then became unresponsive while lying down per his wife for a few <5 minutes.      Assessment/Plan:   Principal Problem:   Altered mental status Active Problems:   Essential hypertension   Hypothyroidism   Anemia  AMS/ unresponsiveness  ? Worsening dementia No syncope per wife Tele Trop I q6h x3- negative  MRI brain: no acute events Check EEG: no seizures Labs pending  Dementia Cont aricept 10mg  po qday Cont Namenda 5mg  po bid  Anxiety/ depression Cont Paxil 10mg  po bid  H/o compression fracture T12 Follow up as outpatient  Hypothyroidism Increase synthroid for elevated TSH  Diabetes Insipidus, vs nocturnal enuresis Cont DDAVP  H/o Orthostatic hypotension Cont midodrine 10mg  po tid -unable to be done here as he could not stand up    PT: SNF-- social work consult Palliative care consult for [place]   Family Communication/Anticipated D/C date and plan/Code Status   DVT prophylaxis: Lovenox ordered. Code Status: Full Code.  Family Communication: wife at bedside Disposition Plan:    Medical Consultants:    Palliative care     Subjective:   No overnight events  Objective:    Vitals:   09/17/17 2307 09/18/17 0050 09/18/17 0357 09/18/17 0929  BP: (!) 173/78 (!) 176/64 (!) 151/68 132/64  Pulse: 67  (!) 58 89  Resp: 18 18 16 18   Temp: 97.7 F (36.5 C) 97.6 F (36.4 C) 97.7 F (36.5 C) 97.8 F (36.6 C)  TempSrc: Oral Oral Oral Oral  SpO2: 99% 98% 96% 95%  Weight:   82.6 kg (182 lb 1.6 oz)     Intake/Output Summary  (Last 24 hours) at 09/18/2017 1258 Last data filed at 09/18/2017 0404 Gross per 24 hour  Intake -  Output 350 ml  Net -350 ml   Filed Weights   09/18/17 0357  Weight: 82.6 kg (182 lb 1.6 oz)    Exam: In chair-- crossing and uncrossing legs NAD Pleasant and cooperative No LE edema Condom cath on  Data Reviewed:   I have personally reviewed following labs and imaging studies:  Labs: Labs show the following:   Basic Metabolic Panel: Recent Labs  Lab 09/17/17 1552 09/18/17 0433  NA 136 138  K 4.2 3.5  CL 101 105  CO2 24 26  GLUCOSE 118* 97  BUN 15 13  CREATININE 1.24 1.07  CALCIUM 9.3 8.9  MG 2.2  --    GFR Estimated Creatinine Clearance: 52.8 mL/min (by C-G formula based on SCr of 1.07 mg/dL). Liver Function Tests: Recent Labs  Lab 09/17/17 1552 09/18/17 0433  AST 32 27  ALT 15* 14*  ALKPHOS 70 57  BILITOT 0.9 1.1  PROT 6.9 6.0*  ALBUMIN 4.0 3.4*   No results for input(s): LIPASE, AMYLASE in the last 168 hours. No results for input(s): AMMONIA in the last 168 hours. Coagulation profile No results for input(s): INR, PROTIME in the last 168 hours.  CBC: Recent Labs  Lab 09/17/17 1552 09/18/17 0433  WBC 5.7 5.3  NEUTROABS 3.8  --   HGB 12.7* 11.1*  HCT 39.9 33.8*  MCV 113.4* 108.3*  PLT 184 176   Cardiac Enzymes: Recent Labs  Lab 09/17/17 1552 09/17/17 2315 09/18/17 0433 09/18/17 0902  TROPONINI <0.03 <0.03 <0.03 <0.03   BNP (last 3 results) No results for input(s): PROBNP in the last 8760 hours. CBG: No results for input(s): GLUCAP in the last 168 hours. D-Dimer: No results for input(s): DDIMER in the last 72 hours. Hgb A1c: No results for input(s): HGBA1C in the last 72 hours. Lipid Profile: No results for input(s): CHOL, HDL, LDLCALC, TRIG, CHOLHDL, LDLDIRECT in the last 72 hours. Thyroid function studies: Recent Labs    09/17/17 2315  TSH 7.461*   Anemia work up: Recent Labs    09/17/17 2315  VITAMINB12 619   Sepsis  Labs: Recent Labs  Lab 09/17/17 1552 09/18/17 0433  WBC 5.7 5.3    Microbiology No results found for this or any previous visit (from the past 240 hour(s)).  Procedures and diagnostic studies:  Dg Chest 2 View  Result Date: 09/17/2017 CLINICAL DATA:  [place]. EXAM: CHEST - 2 VIEW COMPARISON:  07/13/2017 FINDINGS: The cardiac silhouette, mediastinal and hilar contours are within normal limits given the AP projection. There is mild tortuosity and calcification of the thoracic aorta. The lungs are clear. No pleural effusion or pneumothorax. The bony thorax is intact. No definite rib fractures. Remote left distal clavicle fracture noted. There is a compression fracture of the probable T11 vertebral body which appears progressive since the prior chest x-ray. IMPRESSION: No acute cardiopulmonary findings. Progressive compression fracture of T11. Electronically Signed   By: Marijo Sanes M.D.   On: 09/17/2017 16:54   Ct Head Wo Contrast  Result Date: 09/17/2017 CLINICAL DATA:  84 year old male with history of syncopal episode, found on the floor unconscious. Laceration to the left side of the nose. History of dementia. EXAM: CT HEAD WITHOUT CONTRAST TECHNIQUE: Contiguous axial images were obtained from the base of the skull through the vertex without intravenous contrast. COMPARISON:  Head CT 07/13/2017. FINDINGS: Brain: Moderate cerebral atrophy with some ex vacuo dilatation of the ventricular system. Patchy and confluent areas of decreased attenuation are noted throughout the deep and periventricular white matter of the cerebral hemispheres bilaterally, compatible with chronic microvascular ischemic disease. No evidence of acute infarction, hemorrhage, hydrocephalus, extra-axial collection or mass lesion/mass effect. Vascular: No hyperdense vessel or unexpected calcification. Skull: Normal. Negative for fracture or focal lesion. Sinuses/Orbits: No acute finding. Other: None. IMPRESSION: 1. No acute  intracranial abnormalities. 2. Moderate cerebral atrophy with severe chronic microvascular ischemic changes in the cerebral white matter, as above. Electronically Signed   By: Vinnie Langton M.D.   On: 09/17/2017 16:54   Mr Brain Wo Contrast  Result Date: 09/17/2017 CLINICAL DATA:  Initial evaluation for acute altered mental status, syncope. EXAM: MRI HEAD WITHOUT CONTRAST TECHNIQUE: Multiplanar, multiecho pulse sequences of the brain and surrounding structures were obtained without intravenous contrast. COMPARISON:  Prior CT from earlier the same day as well as previous brain MRI from 07/13/2017. FINDINGS: Brain: Moderate atrophy with chronic small vessel ischemic disease, stable. No abnormal foci of restricted diffusion to suggest acute or subacute ischemia. Gray-white matter differentiation maintained. No areas of remote cortical infarction. No evidence for acute or chronic intracranial hemorrhage. No mass lesion, midline shift or mass effect. Diffuse ventricular prominence related to global parenchymal volume loss without hydrocephalus. No extra-axial fluid collection. Pituitary gland within normal limits. Subcentimeter simple cyst noted within the pineal gland, of doubtful clinical significance. Vascular:  Major intracranial vascular flow voids maintained. Skull and upper cervical spine: Craniocervical junction normal. Upper cervical spine demonstrates no significant finding. Bone marrow signal intensity normal. No scalp soft tissue abnormality. Sinuses/Orbits: Patient status post ocular lens extraction bilaterally. Paranasal sinuses are clear. Trace left mastoid effusion, of doubtful significance. Inner ear structures grossly normal. Other: None. IMPRESSION: 1. No acute intracranial abnormality. 2. Moderate atrophy with chronic small vessel ischemic disease, stable. Electronically Signed   By: Jeannine Boga M.D.   On: 09/17/2017 22:58    Medications:   . calcium-vitamin D  1 tablet Oral Q  breakfast  . cholecalciferol  2,000 Units Oral Daily  . desmopressin  0.2 mg Oral QHS  . donepezil  10 mg Oral BID  . enoxaparin (LOVENOX) injection  40 mg Subcutaneous Daily  . levothyroxine  75 mcg Oral QAC breakfast  . memantine  5 mg Oral BID  . midodrine  10 mg Oral TID WC  . multivitamin with minerals  1 tablet Oral Q breakfast  . PARoxetine  10 mg Oral BID   Continuous Infusions:   LOS: 0 days   Geradine Girt  [facility]   *Please refer to [email or web address], password [email or web address] to get updated schedule on who will round on this patient, as hospitalists switch teams weekly. If 7PM-7AM, please contact night-coverage at [email or web address], password [email or web address] for any overnight needs.  09/18/2017, 12:58 PM

## 2017-09-18 NOTE — Evaluation (Signed)
Occupational Therapy Evaluation Patient Details Name: Spencer Howard MRN: [id number] DOB: Sep 25, 1928 Today's Date: 09/18/2017    History of Present Illness Pt is an 84 y/o male admitted secondary to AMS and falls at home. MRI and CT negative for acute abnormality. Chest imaging revealed progressive compression fx of T11. PMH incldues HTN, etoh abuse, and dementia.    Clinical Impression   PTA, pt was living with wife who was assisting with functional mobility and ADLs. Wife reporting pt with multiple falls at home and has been progressively getting weaker. Pt currently requiring Min A for UB ADLs seated, Max A LB ADLs, and Mod-Max A for functional mobility with RW. Pt presenting with decreased strength, balance, and safety awareness and is at a high risk for falls. Pt require cues throughout session for sequencing and safety. Pt will require further acute OT to facilitate safe dc. Recommend dc to SNF for further OT to increase safety and independence with ADLs and functional mobility as well as decrease burden of care and fall risk.     Follow Up Recommendations  SNF;Supervision/Assistance - 24 hour    Equipment Recommendations  Other (comment)(Defer to next venue)    Recommendations for Other Services PT consult     Precautions / Restrictions Precautions Precautions: Fall Precaution Comments: Pt's wife reports multiple falls at home.  Restrictions Weight Bearing Restrictions: No      Mobility Bed Mobility               General bed mobility comments: In recliner upon arrival  Transfers Overall transfer level: Needs assistance Equipment used: Rolling walker (2 wheeled) Transfers: Sit to/from [facility] Sit to Stand: Mod assist;From elevated surface         General transfer comment: Mod A for power up into standing. Pt becomes fatigued quickly and requiring Max A after a few minutes in standing. Pt very shaky and has a tendency for a flexed posture with  RW pushed far forward. Pt requiring Max cues for safety adn RW management.    Balance Overall balance assessment: Needs assistance Sitting-balance support: No upper extremity supported;Feet supported Sitting balance-Leahy Scale: Fair     Standing balance support: Bilateral upper extremity supported;During functional activity Standing balance-Leahy Scale: Poor Standing balance comment: Reliant on BUE support and external support.                            ADL either performed or assessed with clinical judgement   ADL Overall ADL's : Needs assistance/impaired Eating/Feeding: Set up;Supervision/ safety;Sitting   Grooming: Oral care;Sitting;Set up;Standing;Minimal assistance Grooming Details (indicate cue type and reason): Pt performing part of oral care task seated at sink with set up and supervision. Pt transitioning to standing for parts of tasks and requiring MIn A for balance and safety Upper Body Bathing: Minimal assistance;Sitting   Lower Body Bathing: Maximal assistance;Sit to/from stand   Upper Body Dressing : Minimal assistance;Sitting   Lower Body Dressing: Maximal assistance;Sit to/from stand Lower Body Dressing Details (indicate cue type and reason): Pt able to bring ankles to knees for adjusting socks. Requiring Max A for dynamic balance in standing Toilet Transfer: Maximal assistance;+2 for safety/equipment;Ambulation;RW(simulated to walker) Toilet Transfer Details (indicate cue type and reason): Requiring Max A for balance in standing and knees becoming weak quickly. Pt at high risk for falls Toileting- Clothing Manipulation and Hygiene: Minimal assistance;Sitting/lateral lean Toileting - Clothing Manipulation Details (indicate cue type and reason): Pt performing peri  care by laterally lean     Functional mobility during ADLs: Maximal assistance;Rolling walker;+2 for safety/equipment General ADL Comments: Pt demonstrating poor balance and standing tolerance.  Pt becomes fatigued quickly and knees buckle. Pt is at a high risk to fall.      Vision Baseline Vision/History: Wears glasses Patient Visual Report: No change from baseline       Perception     Praxis      Pertinent Vitals/Pain Pain Assessment: No/denies pain     Hand Dominance Left   Extremity/Trunk Assessment Upper Extremity Assessment Upper Extremity Assessment: Generalized weakness   Lower Extremity Assessment Lower Extremity Assessment: Generalized weakness   Cervical / Trunk Assessment Cervical / Trunk Assessment: Kyphotic   Communication Communication Communication: No difficulties   Cognition Arousal/Alertness: Awake/alert Behavior During Therapy: WFL for tasks assessed/performed Overall Cognitive Status: History of cognitive impairments - at baseline                                 General Comments: Dementia at baseline. Presents with impulsivity, poor memory, and decreased safety awareness.   General Comments  Wife present throughout session. Discussing DC plan with wife, palative care, and CM. Pt requiring post-acute rehab and is at a high risk for falls. Discussing differences between home first program and SNF placement.     Exercises     Shoulder Instructions      Home Living Family/patient expects to be discharged to:: Private residence Living Arrangements: Spouse/significant other Available Help at Discharge: Family;Available 24 hours/day Type of Home: House Home Access: Stairs to enter [employer] of Steps: 2 Entrance Stairs-Rails: Right Home Layout: One level     Bathroom Shower/Tub: Occupational psychologist: Standard     Home Equipment: Clinical cytogeneticist - 4 wheels;Cane - single point;Grab bars - tub/shower          Prior Functioning/Environment Level of Independence: Needs assistance  Gait / Transfers Assistance Needed: Wife reports she would help pt ambulate with use of RW. Recently pt has had  multiple falls at home. ADL's / Homemaking Assistance Needed: Wife reports she assists with ADL/IADL tasks.             OT Problem List: Decreased strength;Decreased range of motion;Impaired balance (sitting and/or standing);Decreased activity tolerance;Decreased cognition;Decreased safety awareness;Decreased knowledge of use of DME or AE;Decreased knowledge of precautions      OT Treatment/Interventions: Self-care/ADL training;Therapeutic exercise;Energy conservation;DME and/or AE instruction;Therapeutic activities;Patient/family education    OT Goals(Current goals can be found in the care plan section) Acute Rehab OT Goals Patient Stated Goal: to get stronger  OT Goal Formulation: With patient/family Time For Goal Achievement: 10/02/17 Potential to Achieve Goals: Good ADL Goals Pt Will Perform Grooming: with set-up;with supervision;sitting;standing Pt Will Perform Upper Body Dressing: with set-up;with supervision;sitting Pt Will Perform Lower Body Dressing: with min guard assist;sit to/from stand Pt Will Transfer to Toilet: with min guard assist;ambulating;bedside commode Pt Will Perform Toileting - Clothing Manipulation and hygiene: with min guard assist;sit to/from stand  OT Frequency: Min 2X/week   Barriers to D/C: Other (comment)  High burden of care for wife       Co-evaluation              AM-PAC PT "6 Clicks" Daily Activity     Outcome Measure Help from another person eating meals?: A Little Help from another person taking care of personal grooming?: A Little Help  from another person toileting, which includes using toliet, bedpan, or urinal?: A Lot Help from another person bathing (including washing, rinsing, drying)?: A Lot Help from another person to put on and taking off regular upper body clothing?: A Lot Help from another person to put on and taking off regular lower body clothing?: A Lot 6 Click Score: 14   End of Session Equipment Utilized During  Treatment: Gait belt;Rolling walker Nurse Communication: Mobility status;Other (comment)(Pt pulling off his condom cath upon OT arrival)  Activity Tolerance: Patient tolerated treatment well Patient left: in chair;with call bell/phone within reach;with chair alarm set;with family/visitor present  OT Visit Diagnosis: Unsteadiness on feet (R26.81);Other abnormalities of gait and mobility (R26.89);Muscle weakness (generalized) (M62.81);Other symptoms and signs involving cognitive function;History of falling (Z91.81)                Time: 0677-0340 OT Time Calculation (min): 28 min Charges:  OT General Charges $OT Visit: 1 Visit OT Evaluation $OT Eval Moderate Complexity: 1 Mod OT Treatments $Self Care/Home Management : 8-22 mins G-Codes:     Cambrey Lupi MSOT, OTR/L Acute Rehab Pager: [phone] Office: [place] 09/18/2017, 3:52 PM

## 2017-09-18 NOTE — Consult Note (Signed)
Consultation Note Date: 09/18/2017   Patient Name: Spencer Howard  DOB: 1928/03/31  MRN: [id number]  Age / Sex: 84 y.o., male  PCP: Tisovec, Spencer Him, MD Referring Physician: Geradine Girt, DO  Reason for Consultation: Establishing goals of care and Psychosocial/spiritual support  HPI/Patient Profile: 84 y.o. male  with past medical history of dementia, depression, multiple falls, urinary incontinence, and a history of alcoholism who was admitted on 09/17/2017 with weakness and a period of unresponsiveness.  His wife at bedside now believes he had a seizure.   MR brain shows chronic small vessel ischemic disease, EEG is within normal limits.  TSH is elevated at 7.4  Clinical Assessment and Goals of Care:  I have reviewed medical records including EPIC notes, labs and imaging, received report from Dr. Eliseo Squires assessed the patient and then met at the bedside along with his wife Spencer Howard  to discuss diagnosis prognosis, [place], EOL wishes, disposition and options.  [name] continually attempts to participate in the conversation as well but becomes frustrated as he can not answer questions or follow the conversation.  He makes pleasant jokes and gives compliments to compensate.  I introduced Palliative Medicine as specialized medical care for people living with serious illness. It focuses on providing relief from the symptoms and stress of a serious illness. The goal is to improve quality of life for both the patient and the family.  We discussed a brief life review of the patient. 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  After retirement he went to work for [employer].  He has been married to Pink for 59 years and was an avid Air cabin crew.  They had two children.  Their son is 67 lives locally and helps when he can.  Their daughter died some years ago.  He was diagnosed with dementia approximately 4 years ago.  As far as functional and  nutritional status he has become very weak.  He weakness used to be episodic and he fell, but now it seems more continuous.  He was moving around at home with a rolling walker - but is no longer able to do so.  He is eating about 50% of his meals.  This is decreased from prior but he does not appear to have lost weight.  [name] has been going to [facility] 3 days a week in an attempt to stay active, but he is now unable to do this as well.  [name] is actively seeing Dr. Loni Muse. Ahern of neurology but so far the progression his symptoms has been unstoppable.  I attempted to elicit values and goals of care important to the patient.  The patient and his wife have completed advanced directives.  His wife indicates that they are both DNR.  The couple is concerned and frustrated.  Spencer Howard is unable to care for [name] in the home alone any longer.  She either needs assistance in the home or placement of some type for [name].     Primary Decision Maker:  NEXT OF KIN Wife.    SUMMARY OF RECOMMENDATIONS     If he will benefit from rehab Spencer Howard says he has recovered his strength after the episodes of weakness he has had in the past) he may be an excellent candidate for CIR.   Patient likely needs placement in an [place] or private assistance in the home in addition to PT/OT.   Consider discontinuation of aricept as falling, bradycardia, and seizures are adverse reactions.  Code Status/Advance Care Planning:  DNR   Prognosis:   Unable to determine.   Discharge Planning: To Be Determined      Primary Diagnoses: Present on Admission: . Altered mental status . Hypothyroidism . Essential hypertension   I have reviewed the medical record, interviewed the patient and family, and examined the patient. The following aspects are pertinent.  Past Medical History:  Diagnosis Date  . Alcohol abuse with alcohol-induced mental disorder ([place])   . Dementia associated with  alcoholism ([place])    Alzheimer's dementia versus alcohol related dementia. 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 Depression   . High cholesterol   . Hypertension   . Hypothyroidism (acquired)    Social History   Socioeconomic History  . Marital status: Married    Spouse name: susan  . Number of children: 2  . Years of education: 51  . Highest education level: Not on file  Occupational History  . Occupation: Retired    Comment: Retired Transport planner. 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 Financial resource strain: Not on file  . Food insecurity:    Worry: Not on file    Inability: Not on file  . Transportation needs:    Medical: Not on file    Non-medical: Not on file  Tobacco Use  . Smoking status: Former Smoker    Last attempt to quit: 03/28/1969    Years since quitting: 48.5  . Smokeless tobacco: Never Used  Substance and Sexual Activity  . Alcohol use: No    Alcohol/week: 0.0 oz    Frequency: Never    Comment: Stopped August 2018  . Drug use: No  . Sexual activity: Not on file  Lifestyle  . Physical activity:    Days per week: Not on file    Minutes per session: Not on file  . Stress: Not on file  Relationships  . Social connections:    Talks on phone: Not on file    Gets together: Not on file    Attends religious service: Not on file    Active member of club or organization: Not on file    Attends meetings of clubs or organizations: Not on file    Relationship status: Not on file  Other Topics Concern  . Not on file  Social History Narrative   Married father of 2, grandfather of 53, great grandfather of 106. He is married for 50 years.   Lives at home with wife.    Caffeine use: 1-2 cups per day   Drinks maybe 25-30 glasses of wine a week.= update: 3/4 None since August 2018   No routine exercise.   He grew up in [place] near [place] right-handed, throws left-handed   Family History  Problem Relation Age of  Onset  . Leukemia Father   . Colon cancer Mother   . Dementia Neg Hx    Scheduled Meds: . calcium-vitamin D  1 tablet Oral Q breakfast  .  cholecalciferol  2,000 Units Oral Daily  . desmopressin  0.2 mg Oral QHS  . donepezil  10 mg Oral BID  . enoxaparin (LOVENOX) injection  40 mg Subcutaneous Daily  . levothyroxine  75 mcg Oral QAC breakfast  . memantine  5 mg Oral BID  . midodrine  10 mg Oral TID WC  . multivitamin with minerals  1 tablet Oral Q breakfast  . PARoxetine  10 mg Oral BID   Continuous Infusions: PRN Meds:.acetaminophen **OR** acetaminophen No Known Allergies Review of Systems denies pain, insomnia, constipation, SOB  Physical Exam  Well developed (appears younger than stated age) pleasantly confused.  Awake, Alert CV rrr resp no distress Abdomen soft, nt Ext able to move all 4  Vital Signs: BP (!) 182/74 (BP Location: Right Arm)   Pulse 65   Temp 98 F (36.7 C) (Oral)   Resp 18   Wt 82.6 kg (182 lb 1.6 oz)   SpO2 99%   BMI 25.40 kg/m  Pain Scale: 0-10   Pain Score: (S) 0-No pain   SpO2: SpO2: 99 % O2 Device:SpO2: 99 % O2 Flow Rate: .   IO: Intake/output summary:   Intake/Output Summary (Last 24 hours) at 09/18/2017 1635 Last data filed at 09/18/2017 0404 Gross per 24 hour  Intake -  Output 350 ml  Net -350 ml    LBM:   Baseline Weight: Weight: 82.6 kg (182 lb 1.6 oz) Most recent weight: Weight: 82.6 kg (182 lb 1.6 oz)     Palliative Assessment/Data: 40-50%     Time In: 2:00 Time Out: 3:00 Time Total: 60 min. Greater than 50%  of this time was spent counseling and coordinating care related to the above assessment and plan.  Signed by: Florentina Jenny, PA-C Palliative Medicine Pager: [phone]  Please contact Palliative Medicine Team phone at [phone] for questions and concerns.  For individual provider: See Shea Evans

## 2017-09-19 DIAGNOSIS — R4189 Other symptoms and signs involving cognitive functions and awareness: Secondary | ICD-10-CM

## 2017-09-19 DIAGNOSIS — W19XXXA Unspecified fall, initial encounter: Secondary | ICD-10-CM

## 2017-09-19 DIAGNOSIS — Z515 Encounter for palliative care: Secondary | ICD-10-CM

## 2017-09-19 LAB — ANA: ANA: NEGATIVE

## 2017-09-19 MED ORDER — LEVOTHYROXINE SODIUM 75 MCG PO TABS: 75.0000 ug | ORAL_TABLET | Freq: Every day | ORAL | 0 refills | Status: AC

## 2017-09-19 NOTE — Progress Notes (Signed)
Spoke with wife Sylvestre Rathgeber) over the phone.  We discussed that patient's with vascular dementia have episodes of non-responsiveness similar to what she describes.  We also talked about "[facility]", an organization specifically designed to help [place] go thru the process of engaging with the [place] and obtaining benefits.  I believe there are significant benefits thru the [place] for veterans with dementia (such as in home help) particularly if the vet has seen active duty.  Manuela Schwartz was appreciative.  We have printed the paper work for [employer] and will provide it to her.  Florentina Jenny, PA-C Palliative Medicine Pager: [phone]   Time 15 min.

## 2017-09-19 NOTE — Consult Note (Signed)
   [facility] Inpatient Consult   09/19/2017  Jerome Viglione 08/19/1928 [id number]   Made aware by Tommi Rumps with [facility] First program that Mr. Lazenby enrolled in the [place] program.   Writer advised to speak with patient's wife Keylor Rands. Telephone call made to Manuela Schwartz to make aware that [place] [employer] could assist if transportation or pharmacy needs are identified while on the [place] program. Also discussed that [place] [facility] could potentially receive a referral if community case management needs are identified post [place] enrollment.   Mrs. Lariccia expressed appreciation of the call.   Will make [place] [facility] office aware of patient's enrollment with [place], RN,BSN [facility] Liaison [phone]

## 2017-09-19 NOTE — Discharge Summary (Signed)
Physician Discharge Summary  Unknown Schleyer OVZ:[id number] DOB: June 28, 1928 DOA: 09/17/2017  PCP: Haywood Pao, MD  Admit date: 09/17/2017 Discharge date: 09/19/2017  Admitted From: home Discharge disposition: home   Recommendations for Outpatient Follow-Up:   1. Home health-- [place] [facility] 2. ? If aricept/namenda helpful 3. DNR 4. ? Benefit from the [facility] 5. TSH 6 weeks   Discharge Diagnosis:   Principal Problem:   Altered mental status Active Problems:   Essential hypertension   Hypothyroidism   Anemia   Fall   Unresponsive episode    Discharge Condition: Improved.  Diet recommendation: Low sodium, heart healthy  Wound care: None.  Code status: Full.   History of Present Illness:   Spencer Howard  is a 84 y.o. male, w hypothyroidism, hypertension, hyperlipidemia, etoh abuse, depression, Dementia apparently presents with weakness earlier today. He was trying to get to the bathroom and had to lie down twice, and then became unresponsive while lying down per his wife for a few <5 minutes.       Hospital Course by Problem:   AMS/ unresponsiveness ? Worsening dementia Tele- HR 50s-60s at rest Trop I q6h x3- negative  MRI brain: no acute events  EEG: no seizures  Dementia Cont aricept 10mg  po qday Cont Namenda 5mg  po bid -? Benefit-- defer to PCP  Anxiety/ depression Cont Paxil 10mg  po bid  H/o compression fracture T12 Follow up as outpatient  Hypothyroidism Increase synthroid for elevated TSH  Diabetes Insipidus, vs nocturnal enuresis Cont DDAVP  H/o Orthostatic hypotension Cont midodrine 10mg  po tid -unable to be done here as he could not stand up      Medical Consultants:    Palliative care  Discharge Exam:   Vitals:   09/19/17 0417 09/19/17 1241  BP: (!) 166/89 (!) 151/58  Pulse:  63  Resp:  18  Temp: 97.7 F (36.5 C) 97.8 F (36.6 C)  SpO2: 98% 98%   Vitals:   09/18/17 1952 09/18/17 2344  09/19/17 0417 09/19/17 1241  BP: (!) 159/87 (!) 172/92 (!) 166/89 (!) 151/58  Pulse: 70 80  63  Resp: 18 18  18   Temp: 98.3 F (36.8 C) 97.9 F (36.6 C) 97.7 F (36.5 C) 97.8 F (36.6 C)  TempSrc: Oral Oral Oral Oral  SpO2: 98% 97% 98% 98%  Weight:        General exam: Appears calm and comfortable. No symptoms when getting out of bed today  The results of significant diagnostics from this hospitalization (including imaging, microbiology, ancillary and laboratory) are listed below for reference.     Procedures and Diagnostic Studies:   Dg Chest 2 View  Result Date: 09/17/2017 CLINICAL DATA:  [place]. EXAM: CHEST - 2 VIEW COMPARISON:  07/13/2017 FINDINGS: The cardiac silhouette, mediastinal and hilar contours are within normal limits given the AP projection. There is mild tortuosity and calcification of the thoracic aorta. The lungs are clear. No pleural effusion or pneumothorax. The bony thorax is intact. No definite rib fractures. Remote left distal clavicle fracture noted. There is a compression fracture of the probable T11 vertebral body which appears progressive since the prior chest x-ray. IMPRESSION: No acute cardiopulmonary findings. Progressive compression fracture of T11. Electronically Signed   By: Marijo Sanes M.D.   On: 09/17/2017 16:54   Ct Head Wo Contrast  Result Date: 09/17/2017 CLINICAL DATA:  84 year old male with history of syncopal episode, found on the floor unconscious. Laceration to the left side of  the nose. History of dementia. EXAM: CT HEAD WITHOUT CONTRAST TECHNIQUE: Contiguous axial images were obtained from the base of the skull through the vertex without intravenous contrast. COMPARISON:  Head CT 07/13/2017. FINDINGS: Brain: Moderate cerebral atrophy with some ex vacuo dilatation of the ventricular system. Patchy and confluent areas of decreased attenuation are noted throughout the deep and periventricular white matter of the cerebral hemispheres bilaterally,  compatible with chronic microvascular ischemic disease. No evidence of acute infarction, hemorrhage, hydrocephalus, extra-axial collection or mass lesion/mass effect. Vascular: No hyperdense vessel or unexpected calcification. Skull: Normal. Negative for fracture or focal lesion. Sinuses/Orbits: No acute finding. Other: None. IMPRESSION: 1. No acute intracranial abnormalities. 2. Moderate cerebral atrophy with severe chronic microvascular ischemic changes in the cerebral white matter, as above. Electronically Signed   By: Vinnie Langton M.D.   On: 09/17/2017 16:54   Mr Brain Wo Contrast  Result Date: 09/17/2017 CLINICAL DATA:  Initial evaluation for acute altered mental status, syncope. EXAM: MRI HEAD WITHOUT CONTRAST TECHNIQUE: Multiplanar, multiecho pulse sequences of the brain and surrounding structures were obtained without intravenous contrast. COMPARISON:  Prior CT from earlier the same day as well as previous brain MRI from 07/13/2017. FINDINGS: Brain: Moderate atrophy with chronic small vessel ischemic disease, stable. No abnormal foci of restricted diffusion to suggest acute or subacute ischemia. Gray-white matter differentiation maintained. No areas of remote cortical infarction. No evidence for acute or chronic intracranial hemorrhage. No mass lesion, midline shift or mass effect. Diffuse ventricular prominence related to global parenchymal volume loss without hydrocephalus. No extra-axial fluid collection. Pituitary gland within normal limits. Subcentimeter simple cyst noted within the pineal gland, of doubtful clinical significance. Vascular: Major intracranial vascular flow voids maintained. Skull and upper cervical spine: Craniocervical junction normal. Upper cervical spine demonstrates no significant finding. Bone marrow signal intensity normal. No scalp soft tissue abnormality. Sinuses/Orbits: Patient status post ocular lens extraction bilaterally. Paranasal sinuses are clear. Trace left  mastoid effusion, of doubtful significance. Inner ear structures grossly normal. Other: None. IMPRESSION: 1. No acute intracranial abnormality. 2. Moderate atrophy with chronic small vessel ischemic disease, stable. Electronically Signed   By: Jeannine Boga M.D.   On: 09/17/2017 22:58     Labs:   Basic Metabolic Panel: Recent Labs  Lab 09/17/17 1552 09/18/17 0433  NA 136 138  K 4.2 3.5  CL 101 105  CO2 24 26  GLUCOSE 118* 97  BUN 15 13  CREATININE 1.24 1.07  CALCIUM 9.3 8.9  MG 2.2  --    GFR Estimated Creatinine Clearance: 52.8 mL/min (by C-G formula based on SCr of 1.07 mg/dL). Liver Function Tests: Recent Labs  Lab 09/17/17 1552 09/18/17 0433  AST 32 27  ALT 15* 14*  ALKPHOS 70 57  BILITOT 0.9 1.1  PROT 6.9 6.0*  ALBUMIN 4.0 3.4*   No results for input(s): LIPASE, AMYLASE in the last 168 hours. No results for input(s): AMMONIA in the last 168 hours. Coagulation profile No results for input(s): INR, PROTIME in the last 168 hours.  CBC: Recent Labs  Lab 09/17/17 1552 09/18/17 0433  WBC 5.7 5.3  NEUTROABS 3.8  --   HGB 12.7* 11.1*  HCT 39.9 33.8*  MCV 113.4* 108.3*  PLT 184 176   Cardiac Enzymes: Recent Labs  Lab 09/17/17 1552 09/17/17 2315 09/18/17 0433 09/18/17 0902  TROPONINI <0.03 <0.03 <0.03 <0.03   BNP: Invalid input(s): POCBNP CBG: No results for input(s): GLUCAP in the last 168 hours. D-Dimer No results for input(s): DDIMER  in the last 72 hours. Hgb A1c No results for input(s): HGBA1C in the last 72 hours. Lipid Profile No results for input(s): CHOL, HDL, LDLCALC, TRIG, CHOLHDL, LDLDIRECT in the last 72 hours. Thyroid function studies Recent Labs    09/17/17 2315  TSH 7.461*   Anemia work up Recent Labs    09/17/17 2315  [id number]   Microbiology No results found for this or any previous visit (from the past 240 hour(s)).   Discharge Instructions:   Discharge Instructions    Diet - low sodium heart healthy    Complete by:  As directed    Discharge instructions   Complete by:  As directed    Home health- home first Discuss with PCP if namenda/aricept should be changed/stopped   Increase activity slowly   Complete by:  As directed      Allergies as of 09/19/2017   No Known Allergies     Medication List    TAKE these medications   alendronate 70 MG tablet Commonly known as:  FOSAMAX Take 70 mg by mouth every Monday.   CALCIUM CITRATE + D3 PO Take 1 tablet by mouth daily.   CENTRUM SILVER 50+MEN Tabs Take 1 tablet by mouth daily with breakfast.   desmopressin 0.2 MG tablet Commonly known as:  DDAVP Take 0.2 mg by mouth at bedtime.   donepezil 10 MG tablet Commonly known as:  ARICEPT TAKE 1 TABLET TWICE A DAY   levothyroxine 75 MCG tablet Commonly known as:  SYNTHROID, LEVOTHROID Take 1 tablet (75 mcg total) by mouth daily before breakfast. Start taking on:  09/20/2017 What changed:    medication strength  how much to take   memantine 5 MG tablet Commonly known as:  NAMENDA Take 1 tablet (5 mg total) by mouth 2 (two) times daily.   midodrine 10 MG tablet Commonly known as:  PROAMATINE Take 1 tablet (10 mg total) by mouth 3 (three) times daily.   PARoxetine 10 MG tablet Commonly known as:  PAXIL Take 10 mg by mouth 2 (two) times daily.   Vitamin D 2000 units tablet Take 2,000 Units by mouth daily.      Follow-up Information    Tisovec, Fransico Him, MD Follow up in 1 week(s).   Specialty:  Internal Medicine Contact information: [address] [place] [id number] [phone]            Time coordinating discharge: 35 min  Signed:  Geradine Girt  [facility] 09/19/2017, 12:46 PM

## 2017-09-19 NOTE — Progress Notes (Signed)
Pt was discharged during day shift, waiting for PTAR for transport home, PTAR came to pick up pt at 2100, instructions was already given to pt, pt left the unit at 2115. Obasogie-Asidi, Namiko Pritts Efe

## 2017-09-19 NOTE — Progress Notes (Signed)
Palliative Medicine RN Note: I have called [facility] and left a message requesting information.  Marjie Skiff Roxann Vierra, RN, BSN, [facility] Palliative Medicine Team 09/19/2017 1:29 PM Office [phone]

## 2017-09-19 NOTE — Care Management Note (Addendum)
Case Management Note  Patient Details  Name: Spencer Howard MRN: [id number] Date of Birth: 1928-09-27  Subjective/Objective:                    Action/Plan: Pt discharging home with [facility] program. CM notified Tommi Rumps with [facility] of the patients d/c.  CM will speak with wife about transportation home when she returns to the hospital. CM following.  CM consulted for [facility] aide services. [facility]. [name] will have to go through the [facility] or through the assigned case manager at [facility] to inquire about home care aides. [name] updated.   Addendum: pt's wife asking for PTAR home. CM called and arranged transport. Form on front of chart. Bedside RN updated.   Expected Discharge Date:  09/19/17               Expected Discharge Plan:  Skilled Nursing Facility  In-House Referral:  Clinical Social Work  Discharge planning Services  CM Consult  Post Acute Care Choice:  Home Health Choice offered to:  Spouse  DME Arranged:    DME Agency:     HH Arranged:  RN, OT, PT, Social Work [employer] Agency:  [facility](home first)  Status of Service:  Completed, signed off  If discussed at [employer], dates discussed:    Additional Comments:  Pollie Friar, RN 09/19/2017, 1:29 PM

## 2017-09-20 ENCOUNTER — Telehealth: Payer: Self-pay

## 2017-09-20 DIAGNOSIS — I950 Idiopathic hypotension: Secondary | ICD-10-CM | POA: Diagnosis not present

## 2017-09-20 DIAGNOSIS — S22080D Wedge compression fracture of T11-T12 vertebra, subsequent encounter for fracture with routine healing: Secondary | ICD-10-CM | POA: Diagnosis not present

## 2017-09-20 NOTE — Telephone Encounter (Signed)
[place]enton returned call to the PMT office. She provided contact information at the [place] of who can help with benefits (Umar Awan; [email or web address]). I called Delana Meyer at home and provided her with this information.  Marjie Skiff Izan Miron, RN, BSN, [facility] Palliative Medicine Team 09/20/2017 10:08 AM Office [phone]

## 2017-09-21 DIAGNOSIS — S22080D Wedge compression fracture of T11-T12 vertebra, subsequent encounter for fracture with routine healing: Secondary | ICD-10-CM | POA: Diagnosis not present

## 2017-09-21 DIAGNOSIS — I950 Idiopathic hypotension: Secondary | ICD-10-CM | POA: Diagnosis not present

## 2017-09-22 DIAGNOSIS — I950 Idiopathic hypotension: Secondary | ICD-10-CM | POA: Diagnosis not present

## 2017-09-22 DIAGNOSIS — S22080D Wedge compression fracture of T11-T12 vertebra, subsequent encounter for fracture with routine healing: Secondary | ICD-10-CM | POA: Diagnosis not present

## 2017-09-25 DIAGNOSIS — S22080D Wedge compression fracture of T11-T12 vertebra, subsequent encounter for fracture with routine healing: Secondary | ICD-10-CM | POA: Diagnosis not present

## 2017-09-25 DIAGNOSIS — I950 Idiopathic hypotension: Secondary | ICD-10-CM | POA: Diagnosis not present

## 2017-09-27 DIAGNOSIS — I950 Idiopathic hypotension: Secondary | ICD-10-CM | POA: Diagnosis not present

## 2017-09-27 DIAGNOSIS — S22080D Wedge compression fracture of T11-T12 vertebra, subsequent encounter for fracture with routine healing: Secondary | ICD-10-CM | POA: Diagnosis not present

## 2017-09-29 DIAGNOSIS — I950 Idiopathic hypotension: Secondary | ICD-10-CM | POA: Diagnosis not present

## 2017-09-29 DIAGNOSIS — S22080D Wedge compression fracture of T11-T12 vertebra, subsequent encounter for fracture with routine healing: Secondary | ICD-10-CM | POA: Diagnosis not present

## 2017-10-02 DIAGNOSIS — I950 Idiopathic hypotension: Secondary | ICD-10-CM | POA: Diagnosis not present

## 2017-10-02 DIAGNOSIS — S22080D Wedge compression fracture of T11-T12 vertebra, subsequent encounter for fracture with routine healing: Secondary | ICD-10-CM | POA: Diagnosis not present

## 2017-10-03 DIAGNOSIS — I950 Idiopathic hypotension: Secondary | ICD-10-CM | POA: Diagnosis not present

## 2017-10-03 DIAGNOSIS — S22080D Wedge compression fracture of T11-T12 vertebra, subsequent encounter for fracture with routine healing: Secondary | ICD-10-CM | POA: Diagnosis not present

## 2017-10-04 DIAGNOSIS — I950 Idiopathic hypotension: Secondary | ICD-10-CM | POA: Diagnosis not present

## 2017-10-04 DIAGNOSIS — S22080D Wedge compression fracture of T11-T12 vertebra, subsequent encounter for fracture with routine healing: Secondary | ICD-10-CM | POA: Diagnosis not present

## 2017-10-06 DIAGNOSIS — S22080D Wedge compression fracture of T11-T12 vertebra, subsequent encounter for fracture with routine healing: Secondary | ICD-10-CM | POA: Diagnosis not present

## 2017-10-06 DIAGNOSIS — I950 Idiopathic hypotension: Secondary | ICD-10-CM | POA: Diagnosis not present

## 2017-10-09 DIAGNOSIS — S22080D Wedge compression fracture of T11-T12 vertebra, subsequent encounter for fracture with routine healing: Secondary | ICD-10-CM | POA: Diagnosis not present

## 2017-10-09 DIAGNOSIS — I950 Idiopathic hypotension: Secondary | ICD-10-CM | POA: Diagnosis not present

## 2017-10-10 DIAGNOSIS — S22080D Wedge compression fracture of T11-T12 vertebra, subsequent encounter for fracture with routine healing: Secondary | ICD-10-CM | POA: Diagnosis not present

## 2017-10-10 DIAGNOSIS — I950 Idiopathic hypotension: Secondary | ICD-10-CM | POA: Diagnosis not present

## 2017-10-11 DIAGNOSIS — S22080D Wedge compression fracture of T11-T12 vertebra, subsequent encounter for fracture with routine healing: Secondary | ICD-10-CM | POA: Diagnosis not present

## 2017-10-11 DIAGNOSIS — I950 Idiopathic hypotension: Secondary | ICD-10-CM | POA: Diagnosis not present

## 2017-10-12 DIAGNOSIS — I950 Idiopathic hypotension: Secondary | ICD-10-CM | POA: Diagnosis not present

## 2017-10-12 DIAGNOSIS — S22080D Wedge compression fracture of T11-T12 vertebra, subsequent encounter for fracture with routine healing: Secondary | ICD-10-CM | POA: Diagnosis not present

## 2017-10-16 DIAGNOSIS — I950 Idiopathic hypotension: Secondary | ICD-10-CM | POA: Diagnosis not present

## 2017-10-16 DIAGNOSIS — S22080D Wedge compression fracture of T11-T12 vertebra, subsequent encounter for fracture with routine healing: Secondary | ICD-10-CM | POA: Diagnosis not present

## 2017-10-17 DIAGNOSIS — S22080D Wedge compression fracture of T11-T12 vertebra, subsequent encounter for fracture with routine healing: Secondary | ICD-10-CM | POA: Diagnosis not present

## 2017-10-17 DIAGNOSIS — I950 Idiopathic hypotension: Secondary | ICD-10-CM | POA: Diagnosis not present

## 2017-10-19 DIAGNOSIS — S22080D Wedge compression fracture of T11-T12 vertebra, subsequent encounter for fracture with routine healing: Secondary | ICD-10-CM | POA: Diagnosis not present

## 2017-10-19 DIAGNOSIS — I950 Idiopathic hypotension: Secondary | ICD-10-CM | POA: Diagnosis not present

## 2017-10-20 DIAGNOSIS — S22080D Wedge compression fracture of T11-T12 vertebra, subsequent encounter for fracture with routine healing: Secondary | ICD-10-CM | POA: Diagnosis not present

## 2017-10-20 DIAGNOSIS — I950 Idiopathic hypotension: Secondary | ICD-10-CM | POA: Diagnosis not present

## 2017-10-23 DIAGNOSIS — S22080D Wedge compression fracture of T11-T12 vertebra, subsequent encounter for fracture with routine healing: Secondary | ICD-10-CM | POA: Diagnosis not present

## 2017-10-23 DIAGNOSIS — I950 Idiopathic hypotension: Secondary | ICD-10-CM | POA: Diagnosis not present

## 2017-10-25 ENCOUNTER — Emergency Department (HOSPITAL_COMMUNITY): Payer: Medicare Other

## 2017-10-25 ENCOUNTER — Encounter (HOSPITAL_COMMUNITY): Payer: Self-pay

## 2017-10-25 ENCOUNTER — Emergency Department (HOSPITAL_COMMUNITY)
Admission: EM | Admit: 2017-10-25 | Discharge: 2017-10-25 | Disposition: A | Discharge status: Home / Self Care | Payer: Medicare Other | Attending: Emergency Medicine | Admitting: Emergency Medicine

## 2017-10-25 DIAGNOSIS — R278 Other lack of coordination: Secondary | ICD-10-CM

## 2017-10-25 DIAGNOSIS — E7800 Pure hypercholesterolemia, unspecified: Secondary | ICD-10-CM | POA: Diagnosis not present

## 2017-10-25 DIAGNOSIS — R404 Transient alteration of awareness: Secondary | ICD-10-CM | POA: Diagnosis not present

## 2017-10-25 DIAGNOSIS — Z7401 Bed confinement status: Secondary | ICD-10-CM | POA: Diagnosis not present

## 2017-10-25 DIAGNOSIS — Z87891 Personal history of nicotine dependence: Secondary | ICD-10-CM | POA: Insufficient documentation

## 2017-10-25 DIAGNOSIS — F1027 Alcohol dependence with alcohol-induced persisting dementia: Secondary | ICD-10-CM | POA: Insufficient documentation

## 2017-10-25 DIAGNOSIS — I10 Essential (primary) hypertension: Secondary | ICD-10-CM | POA: Diagnosis not present

## 2017-10-25 DIAGNOSIS — R55 Syncope and collapse: Secondary | ICD-10-CM | POA: Diagnosis not present

## 2017-10-25 DIAGNOSIS — E039 Hypothyroidism, unspecified: Secondary | ICD-10-CM | POA: Diagnosis not present

## 2017-10-25 DIAGNOSIS — R0902 Hypoxemia: Secondary | ICD-10-CM | POA: Diagnosis not present

## 2017-10-25 DIAGNOSIS — R4020 Unspecified coma: Secondary | ICD-10-CM | POA: Diagnosis not present

## 2017-10-25 DIAGNOSIS — M2550 Pain in unspecified joint: Secondary | ICD-10-CM | POA: Diagnosis not present

## 2017-10-25 DIAGNOSIS — Z79899 Other long term (current) drug therapy: Secondary | ICD-10-CM | POA: Insufficient documentation

## 2017-10-25 DIAGNOSIS — R410 Disorientation, unspecified: Secondary | ICD-10-CM | POA: Diagnosis not present

## 2017-10-25 LAB — COMPREHENSIVE METABOLIC PANEL
ALK PHOS: 61 U/L (ref 38–126)
ALT: 14 U/L (ref 0–44)
ANION GAP: 11 (ref 5–15)
AST: 34 U/L (ref 15–41)
Albumin: 3.7 g/dL (ref 3.5–5.0)
BUN: 13 mg/dL (ref 8–23)
CO2: 25 mmol/L (ref 22–32)
CREATININE: 1.23 mg/dL (ref 0.61–1.24)
Calcium: 9 mg/dL (ref 8.9–10.3)
Chloride: 103 mmol/L (ref 98–111)
GFR, EST AFRICAN AMERICAN: 59 mL/min — AB (ref >60)
GFR, EST NON AFRICAN AMERICAN: 51 mL/min — AB (ref >60)
Glucose, Bld: 173 mg/dL — ABNORMAL HIGH (ref 70–99)
Potassium: 4 mmol/L (ref 3.5–5.1)
Sodium: 139 mmol/L (ref 135–145)
TOTAL PROTEIN: 6.4 g/dL — AB (ref 6.5–8.1)
Total Bilirubin: 1.2 mg/dL (ref 0.3–1.2)

## 2017-10-25 LAB — I-STAT TROPONIN, ED: Troponin i, poc: 0.01 ng/mL (ref 0.00–0.08)

## 2017-10-25 LAB — URINALYSIS, ROUTINE W REFLEX MICROSCOPIC
BILIRUBIN URINE: NEGATIVE
GLUCOSE, UA: NEGATIVE mg/dL
Hgb urine dipstick: NEGATIVE
Ketones, ur: NEGATIVE mg/dL
LEUKOCYTES UA: NEGATIVE
NITRITE: NEGATIVE
PH: 6 (ref 5.0–8.0)
Protein, ur: 30 mg/dL — AB
SPECIFIC GRAVITY, URINE: 1.018 (ref 1.005–1.030)

## 2017-10-25 LAB — CBC WITH DIFFERENTIAL/PLATELET
BASOS PCT: 0 %
Basophils Absolute: 0 10*3/uL (ref 0.0–0.1)
EOS ABS: 0.1 10*3/uL (ref 0.0–0.7)
EOS PCT: 1 %
HCT: 40.4 % (ref 39.0–52.0)
HEMOGLOBIN: 12.9 g/dL — AB (ref 13.0–17.0)
Lymphocytes Relative: 55 %
Lymphs Abs: 2.7 10*3/uL (ref 0.7–4.0)
MCH: 35.2 pg — AB (ref 26.0–34.0)
MCHC: 31.9 g/dL (ref 30.0–36.0)
MCV: 110.4 fL — AB (ref 78.0–100.0)
MONO ABS: 0.8 10*3/uL (ref 0.1–1.0)
Monocytes Relative: 15 %
NEUTROS PCT: 29 %
Neutro Abs: 1.5 10*3/uL — ABNORMAL LOW (ref 1.7–7.7)
PLATELETS: 231 10*3/uL (ref 150–400)
RBC: 3.66 MIL/uL — ABNORMAL LOW (ref 4.22–5.81)
RDW: 14.6 % (ref 11.5–15.5)
WBC: 5.1 10*3/uL (ref 4.0–10.5)

## 2017-10-25 LAB — TROPONIN I: Troponin I: <0.03 ng/mL (ref <0.03)

## 2017-10-25 MED ORDER — SODIUM CHLORIDE 0.9 % IV BOLUS
1000.0000 mL | Freq: Once | INTRAVENOUS | Status: AC
Start: 2017-10-25 — End: 2017-10-25
  Administered 2017-10-25: 1000 mL via INTRAVENOUS

## 2017-10-25 MED ORDER — LORAZEPAM 2 MG/ML IJ SOLN
1.0000 mg | Freq: Once | INTRAMUSCULAR | Status: AC
  Administered 2017-10-25: 1 mg via INTRAVENOUS
  Filled 2017-10-25: qty 1

## 2017-10-25 NOTE — ED Notes (Signed)
Condom cath placed on pt 

## 2017-10-25 NOTE — ED Notes (Signed)
Patient transported to CT 

## 2017-10-25 NOTE — ED Provider Notes (Signed)
Care assumed from [facility] at [facility]. Pt had reported 20-83minute syncopal episode today. Has had syncope in past however this is longer than normal. History alcohol use disorder and Wernicke's and bilat neuropathy. Lives in nursing facility. Today he had 40 point drop from laying to sitting (doesn't stand at baseline). Has improved to 25pt drop since IVFs. Remainder or workup similar to baseline. Concern for new left sided dysmetria of LUE/LLE. Care was discussed with Neuro who recommended MR of brain and C-spine and if unremarkable should f/u [facility] but if normal can return to facility. Has had negative cardiac workup in past for syncope. CT head negative. Wife present at bedside.   Physical Exam  BP (!) 138/59   Pulse 60   Temp 97.6 F (36.4 C) (Oral)   Resp 16   SpO2 98%   Physical Exam  Constitutional: No distress.  HENT:  Head: Normocephalic and atraumatic.  Eyes: Conjunctivae are normal. Right eye exhibits no discharge. Left eye exhibits no discharge.  Neurological: He is alert.  Appears to answer questions well. 5/5 grip, bicep flex, and tricep ext bilaterally. Mild dysmetria of LUE.   Skin: He is not diaphoretic.    ED Course/Procedures     Procedures  MDM  Pt has MRI brain and C-spine that are pending to evaluate for acute stroke. If negative, then needs PTAR transfer back to his nursing facility.   MRI reviewed with no acute findings to explain pts symptoms. Pt reevaluated, no distress. No pain. Reports feeling at baseline. They state they will f/u with PCP and neurologist. They feel comfortable with pt returning to his living facility. Does not ambulate at baseline. Stable at discharge.       Corrie Dandy, MD 10/25/17 [facility] [place]    Blanchie Dessert, MD 10/26/17 [phone]

## 2017-10-25 NOTE — ED Triage Notes (Signed)
Pt arrived via [facility]; pt from home, moved from bed to chair and had syncopal episode for 20 mins per wife; A/Ox1; during EMS arrival pt continued to have sycopal episodes; pt was diaphoretic, cool, pale, did not have radial pulses initially per EMS; Hx of Dementia; HTN; 137/82; 70s with PACs, 91% on RA, but 97% on 2L via [place]; CBG 190

## 2017-10-25 NOTE — ED Provider Notes (Signed)
[facility] EMERGENCY DEPARTMENT Provider Note   CSN: [id number] Arrival date & time: 10/25/17  2423     History   Chief Complaint Chief Complaint  Patient presents with  . Loss of Consciousness    HPI Spencer Howard is a 84 y.o. male with a history of dementia, alcohol use disorder, orthostatic hypotension, hypercholesterolemia, HTN, acquired hypothyroidism, and depression who presents to the emergency department from home by EMS with a chief complaint of syncope.  The patient's wife reports that the patient was moving to his chair where he gets dressed when he suddenly slumped over after sitting down at 9:01 AM.  She reports that this is happened several times, most recently 6 weeks ago where he syncopized for 4 to 5 minutes, so she set a timer.  EMS reports they arrived approximately 18 minutes later and noted the entire syncopal episode lasted for approximately 20 to 25 minutes.  On arrival, EMS noted the patient to be diaphoretic, cool, pale, and did not have radial pulses bilaterally.  EMS notes they were able to palpate radial pulses after he was in the truck approximately 7 to 8 minutes later.  They report multiple recurrent syncopal episodes in route that did not last more than 1 to 2 minutes.  His wife notes that his eyes are open during the episode and he has drooling.  She is unsure of urinary or fecal incontinence as the patient wears depends.  No shaking or jerking during episodes, but the patient is not responsive to voice or stimulation.  SaO2 91% on room air and the patient was placed on 2 L nasal cannula, improving to 97%.  CBG 190.  BP 137/82.  Heart rate in the 70s with PACs noted on EKG strip. In the emergency department, the patient is awake and confused.    The patient's wife states that this is his baseline.  He does not recall what happened.  He denies dyspnea, chest pain, cough, headache, or weakness.  The patient was most recently admitted for  a similar episode and 09/17/2017.  He had a negative cardiac nuclear stress test in December 2017 with an echo at that time that showed an EF of 55 to 60%.  Level 5 caveat secondary to dementia.  The history is provided by the patient. The history is limited by the condition of the patient. No language interpreter was used.    Past Medical History:  Diagnosis Date  . Alcohol abuse with alcohol-induced mental disorder ([place])   . Dementia associated with alcoholism ([place])    Alzheimer's dementia versus alcohol related dementia. 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 Depression   . High cholesterol   . Hypertension   . Hypothyroidism (acquired)     Patient Active Problem List   Diagnosis Date Noted  . Palliative care encounter   . Fall   . Unresponsive episode   . Altered mental status 09/17/2017  . Anemia 09/17/2017  . Dementia, Alzheimer's, with behavior disturbance 05/29/2017  . Orthostatic hypotension 11/04/2016  . Closed left hand fracture 11/02/2016  . Dementia 11/02/2016  . Syncope 10/31/2016  . DOE (dyspnea on exertion) 03/05/2016  . Abnormal fourth heart sound (S4) 03/05/2016  . Essential hypertension 02/01/2015  . HLD (hyperlipidemia) 02/01/2015  . Depressed 02/01/2015  . Hypothyroidism 02/01/2015    Past Surgical History:  Procedure Laterality Date  . ELBOW SURGERY Left 1995   Broken  . NM MYOVIEW LTD  02/2016  Normal LV function - EF 60-65%. No EKG changes. No ischemia or infarction noted on imaging. LOW RISK  . TONSILLECTOMY  1942  . TRANSTHORACIC ECHOCARDIOGRAM  02/2016   Moderate concentric LVH with EF 55-60%. GR 1 DD. Mild LA dilation. Mildly increased PA pressures  . VARICOCELECTOMY  1967        Home Medications    Prior to Admission medications   Medication Sig Start Date End Date Taking? Authorizing Provider  alendronate (FOSAMAX) 70 MG tablet Take 70 mg by mouth every Monday.  05/18/15  Yes [provider]  Calcium Citrate-Vitamin D (CALCIUM CITRATE + D3 PO) Take 1 tablet by mouth daily.   Yes [provider]  Cholecalciferol (VITAMIN D) 2000 units tablet Take 2,000 Units by mouth daily.   Yes [provider]  donepezil (ARICEPT) 10 MG tablet TAKE 1 TABLET TWICE A DAY 07/06/17  Yes Melvenia Beam, MD  levothyroxine (SYNTHROID, LEVOTHROID) 75 MCG tablet Take 1 tablet (75 mcg total) by mouth daily before breakfast. 09/20/17  Yes Vann, Jessica U, DO  memantine (NAMENDA) 5 MG tablet Take 1 tablet (5 mg total) by mouth 2 (two) times daily. 05/29/17  Yes Melvenia Beam, MD  midodrine (PROAMATINE) 10 MG tablet Take 1 tablet (10 mg total) by mouth 3 (three) times daily. 12/21/16  Yes Melvenia Beam, MD  Multiple Vitamins-Minerals (CENTRUM SILVER 50+MEN) TABS Take 1 tablet by mouth daily with breakfast.   Yes [provider]  PARoxetine (PAXIL) 10 MG tablet Take 10 mg by mouth 2 (two) times daily.  06/27/17  Yes [provider]    Family History Family History  Problem Relation Age of Onset  . Leukemia Father   . Colon cancer Mother   . Dementia Neg Hx     Social History Social History   Tobacco Use  . Smoking status: Former Smoker    Last attempt to quit: 03/28/1969    Years since quitting: 48.6  . Smokeless tobacco: Never Used  Substance Use Topics  . Alcohol use: No    Alcohol/week: 0.0 oz    Frequency: Never    Comment: Stopped August 2018  . Drug use: No     Allergies   Patient has no known allergies.   Review of Systems Review of Systems  Unable to perform ROS: Dementia  Constitutional: Negative for fever.  Eyes: Negative for visual disturbance.  Respiratory: Negative for shortness of breath.   Cardiovascular: Negative for chest pain.  Gastrointestinal: Negative for vomiting.  Neurological: Positive for syncope. Negative for dizziness and headaches.     Physical Exam Updated Vital Signs BP (!) 138/59   Pulse 60   Temp 97.6 F  (36.4 C) (Oral)   Resp 16   SpO2 98%   Physical Exam  Constitutional: He appears well-developed.  HENT:  Head: Normocephalic.  Eyes: Conjunctivae and EOM are normal. No scleral icterus.  2 mm pupils, equal, round, and reactive  Neck: Normal range of motion. Neck supple.  Cardiovascular: Normal rate, regular rhythm, normal heart sounds and intact distal pulses. Exam reveals no gallop and no friction rub.  No murmur heard. Radial, DP, PT pulses are 2+ and symmetric.  Pulmonary/Chest: Effort normal. No stridor. No respiratory distress. He has no wheezes. He has no rales. He exhibits no tenderness.  Abdominal: Soft. Bowel sounds are normal. He exhibits no distension and no mass. There is no tenderness. There is no rebound and no guarding. No hernia.  Musculoskeletal: He  exhibits no edema, tenderness or deformity.  Neurological: He is alert.  Oriented to self.  This is the patient's baseline.  Appears to have Warnicke's encephalopathy as the patient asked 3 times in the room what the nasal cannula is that is in place.  Cranial nerves II through XII are grossly intact.  Dysmetria with finger-to-nose and heel-to-shin on the left.  There is some cogwheeling of the bilateral upper extremities.  There is some increased tone of the bilateral upper extremities.  No clonus.  Decreased vibratory and temperature sensation that extends from the bilateral feet to the bilateral knees.  There is bilateral diadochokinesis with rapid alternating movements.  5 out of 5 strength against resistance of the upper and lower extremities.  Ambulation deferred at this time.     Skin: Skin is warm and dry. Capillary refill takes less than 2 seconds.  Psychiatric: His behavior is normal.  Nursing note and vitals reviewed.  ED Treatments / Results  Labs (all labs ordered are listed, but only abnormal results are displayed) Labs Reviewed  CBC WITH DIFFERENTIAL/PLATELET - Abnormal; Notable for the following  components:      Result Value   RBC 3.66 (*)    Hemoglobin 12.9 (*)    MCV 110.4 (*)    MCH 35.2 (*)    Neutro Abs 1.5 (*)    All other components within normal limits  COMPREHENSIVE METABOLIC PANEL - Abnormal; Notable for the following components:   Glucose, Bld 173 (*)    Total Protein 6.4 (*)    GFR calc non Af Amer 51 (*)    GFR calc Af Amer 59 (*)    All other components within normal limits  URINALYSIS, ROUTINE W REFLEX MICROSCOPIC - Abnormal; Notable for the following components:   APPearance HAZY (*)    Protein, ur 30 (*)    Bacteria, UA RARE (*)    All other components within normal limits  TROPONIN I  CBG MONITORING, ED  I-STAT TROPONIN, ED    EKG None  Radiology Dg Chest 2 View  Result Date: 10/25/2017 CLINICAL DATA:  Syncopal episode today. Amnesia to the event. History of dementia. EXAM: CHEST - 2 VIEW COMPARISON:  Chest x-ray of September 17, 2017 FINDINGS: The lungs are well-expanded and clear. The heart and pulmonary vascularity are normal. The mediastinum is normal in width. There is calcification in the wall of the aortic arch. There is stable partial compression of the body of T11. IMPRESSION: There is no acute cardiopulmonary abnormality. Probable chronic bronchitic changes, stable. Thoracic aortic atherosclerosis. Electronically Signed   By: David  Martinique M.D.   On: 10/25/2017 11:16   Ct Head Wo Contrast  Result Date: 10/25/2017 CLINICAL DATA:  Syncope.  Dementia. EXAM: CT HEAD WITHOUT CONTRAST TECHNIQUE: Contiguous axial images were obtained from the base of the skull through the vertex without intravenous contrast. COMPARISON:  MRI head 09/17/2017 FINDINGS: Brain: Moderate atrophy and ventricular enlargement is stable. Chronic white matter changes stable. Negative for acute infarct, hemorrhage, mass. Vascular: Negative for acute vascular thrombosis. Skull: Negative Sinuses/Orbits: Paranasal sinuses clear. Bilateral cataract surgery. Other: None IMPRESSION: Moderate  atrophy and moderate chronic microvascular ischemia in the white matter. No acute abnormality and no change from the prior study. Electronically Signed   By: Franchot Gallo M.D.   On: 10/25/2017 12:28    Procedures Procedures (including critical care time)  Medications Ordered in ED Medications  sodium chloride 0.9 % bolus 1,000 mL (0 mLs Intravenous Stopped 10/25/17 1244)  LORazepam (ATIVAN) injection 1 mg (1 mg Intravenous Given 10/25/17 1514)     Initial Impression / Assessment and Plan / ED Course  I have reviewed the triage vital signs and the nursing notes.  Pertinent labs & imaging results that were available during my care of the patient were reviewed by me and considered in my medical decision making (see chart for details).      84 year old male with a history of dementia, alcohol use disorder, orthostatic hypotension, hypercholesterolemia, HTN, acquired hypothyroidism, and depression who presents to the emergency department with a chief complaint of syncope.  EMS notes that the patient was diaphoretic, cool, and pale on arrival.  They were unable to palpate radial pulses for several minutes.  He has previously been worked up for multiple episodes of syncope secondary to orthostatic hypotension over the last few years.  He had a normal nuclear stress test and ECHO in 12/17.  The patient was seen and evaluated along with Dr. Lita Mains, attending physician.  Upon reviewing the patient's medical record, there is a note from cardiology on 11/18/2016 that describes a similar syncopal episode that was much shorter in duration where the patient syncopized at his PCPs office and supposedly lost pulses for a few seconds.  EKG with PACs, but otherwise unremarkable.  Troponin is negative.  Labs are otherwise at the patient's baseline.  Chest x-ray and head CT are negative.  The patient was found to have positive orthostatics with a 40 point drop in pressure going from a laying to sitting.  On  reevaluation, the patient's wife reports that he has had a steady decline over the last week.  She reports he had a syncopal episode that was much shorter in duration about 1 week ago.  He is scheduled to go to a new assisted living facility this afternoon.  On exam, the patient has dysmetria of the left upper and lower extremity with bilateral cogwheeling and increased tone of the bilateral lower extremities.  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  The patient's medical record reports that the patient was previously drinking 2 bottles of wine per day.  The patient's wife states that he was drinking 1 bottle of wine per day, but states that this was only for a few years because they were [employer] for 30 years.  The patient was seen by his neurologist, Dr. Lavell Anchors, on 05/29/2017 documented no dysmetria or dysdiadochokinesis, which was seen today.  Will order MR brain and cervical spine to assess for CVA.  I suspect if MRI is negative that this is worsening of the patient's cerebellar dysfunction secondary to alcohol use disorder or question parkinsonism. Patient care transferred to Corrie Dandy, EM resident, at the end of my shift.  We will plan for admission if MRI shows acute stroke. If negative, will coordinate transportation to SNF and have the patient follow-up with Dr. Lavell Anchors in the clinic.  Patient presentation, ED course, and plan of care discussed with review of all pertinent labs and imaging. Please see his/her note for further details regarding further ED course and disposition.   Final Clinical Impressions(s) / ED Diagnoses   Final diagnoses:  None    ED Discharge Orders    None       Joanne Gavel, PA-C 10/25/17 1620    Julianne Rice, MD 10/28/17 1549

## 2017-10-25 NOTE — ED Notes (Signed)
Patient transported to X-ray 

## 2017-10-25 NOTE — Discharge Instructions (Signed)
Continue home medications as previously prescribed. Return if symptoms worsen. Follow up with your primary care doctor and neurologist for reevaluation.

## 2017-10-25 NOTE — ED Notes (Signed)
Pt unable to stand. Pt is bed bound.

## 2017-10-25 NOTE — ED Notes (Signed)
Patient transported to MRI 

## 2017-10-26 DIAGNOSIS — M6281 Muscle weakness (generalized): Secondary | ICD-10-CM | POA: Diagnosis not present

## 2017-10-26 DIAGNOSIS — E039 Hypothyroidism, unspecified: Secondary | ICD-10-CM | POA: Diagnosis not present

## 2017-10-26 DIAGNOSIS — G309 Alzheimer's disease, unspecified: Secondary | ICD-10-CM | POA: Diagnosis not present

## 2017-10-26 DIAGNOSIS — M810 Age-related osteoporosis without current pathological fracture: Secondary | ICD-10-CM | POA: Diagnosis not present

## 2017-10-26 DIAGNOSIS — F0391 Unspecified dementia with behavioral disturbance: Secondary | ICD-10-CM | POA: Diagnosis not present

## 2017-10-26 DIAGNOSIS — I951 Orthostatic hypotension: Secondary | ICD-10-CM | POA: Diagnosis not present

## 2017-10-26 DIAGNOSIS — F321 Major depressive disorder, single episode, moderate: Secondary | ICD-10-CM | POA: Diagnosis not present

## 2017-10-31 DIAGNOSIS — M810 Age-related osteoporosis without current pathological fracture: Secondary | ICD-10-CM | POA: Diagnosis not present

## 2017-10-31 DIAGNOSIS — E039 Hypothyroidism, unspecified: Secondary | ICD-10-CM | POA: Diagnosis not present

## 2017-10-31 DIAGNOSIS — M6281 Muscle weakness (generalized): Secondary | ICD-10-CM | POA: Diagnosis not present

## 2017-10-31 DIAGNOSIS — F0391 Unspecified dementia with behavioral disturbance: Secondary | ICD-10-CM | POA: Diagnosis not present

## 2017-10-31 DIAGNOSIS — F321 Major depressive disorder, single episode, moderate: Secondary | ICD-10-CM | POA: Diagnosis not present

## 2017-10-31 DIAGNOSIS — I951 Orthostatic hypotension: Secondary | ICD-10-CM | POA: Diagnosis not present

## 2017-10-31 DIAGNOSIS — G309 Alzheimer's disease, unspecified: Secondary | ICD-10-CM | POA: Diagnosis not present

## 2017-11-01 DIAGNOSIS — F331 Major depressive disorder, recurrent, moderate: Secondary | ICD-10-CM | POA: Diagnosis not present

## 2017-11-01 DIAGNOSIS — G309 Alzheimer's disease, unspecified: Secondary | ICD-10-CM | POA: Diagnosis not present

## 2017-11-01 DIAGNOSIS — F321 Major depressive disorder, single episode, moderate: Secondary | ICD-10-CM | POA: Diagnosis not present

## 2017-11-01 DIAGNOSIS — R55 Syncope and collapse: Secondary | ICD-10-CM | POA: Diagnosis not present

## 2017-11-01 DIAGNOSIS — M6281 Muscle weakness (generalized): Secondary | ICD-10-CM | POA: Diagnosis not present

## 2017-11-01 DIAGNOSIS — F0391 Unspecified dementia with behavioral disturbance: Secondary | ICD-10-CM | POA: Diagnosis not present

## 2017-11-01 DIAGNOSIS — F0281 Dementia in other diseases classified elsewhere with behavioral disturbance: Secondary | ICD-10-CM | POA: Diagnosis not present

## 2017-11-01 DIAGNOSIS — G301 Alzheimer's disease with late onset: Secondary | ICD-10-CM | POA: Diagnosis not present

## 2017-11-01 DIAGNOSIS — M810 Age-related osteoporosis without current pathological fracture: Secondary | ICD-10-CM | POA: Diagnosis not present

## 2017-11-01 DIAGNOSIS — E039 Hypothyroidism, unspecified: Secondary | ICD-10-CM | POA: Diagnosis not present

## 2017-11-01 DIAGNOSIS — I951 Orthostatic hypotension: Secondary | ICD-10-CM | POA: Diagnosis not present

## 2017-11-03 DIAGNOSIS — M810 Age-related osteoporosis without current pathological fracture: Secondary | ICD-10-CM | POA: Diagnosis not present

## 2017-11-03 DIAGNOSIS — F0391 Unspecified dementia with behavioral disturbance: Secondary | ICD-10-CM | POA: Diagnosis not present

## 2017-11-03 DIAGNOSIS — R55 Syncope and collapse: Secondary | ICD-10-CM | POA: Diagnosis not present

## 2017-11-03 DIAGNOSIS — M6281 Muscle weakness (generalized): Secondary | ICD-10-CM | POA: Diagnosis not present

## 2017-11-03 DIAGNOSIS — I951 Orthostatic hypotension: Secondary | ICD-10-CM | POA: Diagnosis not present

## 2017-11-03 DIAGNOSIS — F329 Major depressive disorder, single episode, unspecified: Secondary | ICD-10-CM | POA: Diagnosis not present

## 2017-11-03 DIAGNOSIS — E039 Hypothyroidism, unspecified: Secondary | ICD-10-CM | POA: Diagnosis not present

## 2017-11-03 DIAGNOSIS — G309 Alzheimer's disease, unspecified: Secondary | ICD-10-CM | POA: Diagnosis not present

## 2017-11-08 DIAGNOSIS — L0292 Furuncle, unspecified: Secondary | ICD-10-CM | POA: Diagnosis not present

## 2017-11-10 DIAGNOSIS — E039 Hypothyroidism, unspecified: Secondary | ICD-10-CM | POA: Diagnosis not present

## 2017-11-10 DIAGNOSIS — G309 Alzheimer's disease, unspecified: Secondary | ICD-10-CM | POA: Diagnosis not present

## 2017-11-10 DIAGNOSIS — I951 Orthostatic hypotension: Secondary | ICD-10-CM | POA: Diagnosis not present

## 2017-11-10 DIAGNOSIS — R55 Syncope and collapse: Secondary | ICD-10-CM | POA: Diagnosis not present

## 2017-11-10 DIAGNOSIS — M810 Age-related osteoporosis without current pathological fracture: Secondary | ICD-10-CM | POA: Diagnosis not present

## 2017-11-10 DIAGNOSIS — F0391 Unspecified dementia with behavioral disturbance: Secondary | ICD-10-CM | POA: Diagnosis not present

## 2017-11-10 DIAGNOSIS — L89300 Pressure ulcer of unspecified buttock, unstageable: Secondary | ICD-10-CM | POA: Diagnosis not present

## 2017-11-10 DIAGNOSIS — F329 Major depressive disorder, single episode, unspecified: Secondary | ICD-10-CM | POA: Diagnosis not present

## 2017-11-10 DIAGNOSIS — M6281 Muscle weakness (generalized): Secondary | ICD-10-CM | POA: Diagnosis not present

## 2017-11-15 DIAGNOSIS — F419 Anxiety disorder, unspecified: Secondary | ICD-10-CM | POA: Diagnosis not present

## 2017-11-15 DIAGNOSIS — G301 Alzheimer's disease with late onset: Secondary | ICD-10-CM | POA: Diagnosis not present

## 2017-11-15 DIAGNOSIS — F0281 Dementia in other diseases classified elsewhere with behavioral disturbance: Secondary | ICD-10-CM | POA: Diagnosis not present

## 2017-11-15 DIAGNOSIS — F331 Major depressive disorder, recurrent, moderate: Secondary | ICD-10-CM | POA: Diagnosis not present

## 2017-11-29 DIAGNOSIS — I10 Essential (primary) hypertension: Secondary | ICD-10-CM | POA: Diagnosis not present

## 2017-11-29 DIAGNOSIS — F419 Anxiety disorder, unspecified: Secondary | ICD-10-CM | POA: Diagnosis not present

## 2017-12-04 ENCOUNTER — Ambulatory Visit (INDEPENDENT_AMBULATORY_CARE_PROVIDER_SITE_OTHER): Payer: Medicare Other | Admitting: Adult Health

## 2017-12-04 ENCOUNTER — Encounter: Payer: Self-pay | Admitting: Adult Health

## 2017-12-04 VITALS — BP 124/64 | HR 80

## 2017-12-04 DIAGNOSIS — F0280 Dementia in other diseases classified elsewhere without behavioral disturbance: Secondary | ICD-10-CM

## 2017-12-04 DIAGNOSIS — R55 Syncope and collapse: Secondary | ICD-10-CM

## 2017-12-04 DIAGNOSIS — L89300 Pressure ulcer of unspecified buttock, unstageable: Secondary | ICD-10-CM | POA: Diagnosis not present

## 2017-12-04 DIAGNOSIS — I951 Orthostatic hypotension: Secondary | ICD-10-CM | POA: Diagnosis not present

## 2017-12-04 DIAGNOSIS — M6281 Muscle weakness (generalized): Secondary | ICD-10-CM | POA: Diagnosis not present

## 2017-12-04 DIAGNOSIS — I10 Essential (primary) hypertension: Secondary | ICD-10-CM | POA: Diagnosis not present

## 2017-12-04 DIAGNOSIS — G309 Alzheimer's disease, unspecified: Secondary | ICD-10-CM | POA: Diagnosis not present

## 2017-12-04 DIAGNOSIS — M810 Age-related osteoporosis without current pathological fracture: Secondary | ICD-10-CM | POA: Diagnosis not present

## 2017-12-04 DIAGNOSIS — F329 Major depressive disorder, single episode, unspecified: Secondary | ICD-10-CM | POA: Diagnosis not present

## 2017-12-04 DIAGNOSIS — F0391 Unspecified dementia with behavioral disturbance: Secondary | ICD-10-CM | POA: Diagnosis not present

## 2017-12-04 DIAGNOSIS — G301 Alzheimer's disease with late onset: Secondary | ICD-10-CM | POA: Diagnosis not present

## 2017-12-04 DIAGNOSIS — E039 Hypothyroidism, unspecified: Secondary | ICD-10-CM | POA: Diagnosis not present

## 2017-12-04 DIAGNOSIS — S60811A Abrasion of right wrist, initial encounter: Secondary | ICD-10-CM | POA: Diagnosis not present

## 2017-12-04 NOTE — Patient Instructions (Signed)
Your Plan:  Continue Aricept Increase Namenda 5 mg in the morning and 10 mg at bedtime If your symptoms worsen or you develop new symptoms please let us know.   Thank you for coming to see [place] at [facility]. I hope we have been able to provide you high quality care today.  You may receive a patient satisfaction survey over the next few weeks. We would appreciate your feedback and comments so that we may continue to improve ourselves and the health of our patients.

## 2017-12-04 NOTE — Progress Notes (Signed)
Spencer Howard: Spencer Howard DOB: [birth year]  REASON FOR VISIT: follow up HISTORY FROM: Spencer Howard  HISTORY OF PRESENT ILLNESS: Today 12/04/17:  Spencer Howard is an 84 year old male with a history of dementia.  He returns today for follow-up.  He is currently on Aricept and Namenda.  He is on low-dose Namenda taking 5 mg twice a day.  Since the last visit the Spencer Howard had 2 syncopal.  He had a thorough work-up in the hospital that was relatively unremarkable.  He was sent to [facility] for rehab.  He is currently residing at [name].  His wife is unsure that she will be able to bring him back home.  He continues to use a wheelchair when ambulating.  He has had any additional syncope events since he has been at [name].  He requires assistance with all ADLs.  Reports good appetite.  Denies any changes in his sleep.  His wife reports that he was agitated initially when he first moved [name] but that has improved.  She reports that he is no longer drinking alcohol.  He is not drank since summer 2018.  He had an EEG at light of his hospitalization that was unremarkable.  MRI of the brain shows cerebral atrophy.  he returns today for evaluation.  HISTORY  05/29/2017: Spencer Howard her efor follow up of dementia. Has had multiple complete neuropsychiatric evaluations and diagnosed with MCI likely Alzheimer's as well as depression. He is on Aricept.  He goes to [employer] connection twice a week for 4 hours. Other than that he sits, PT did "not take". He sits in a chair all day. He can walk around in the house but can't walk far. He has had one fall, now using the walker. Here with his wife who provides most information. His memory is worsening. MMSE 19/30 today (last 21). He refused to use the walker today. Encouraged using the walker all the time. 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 Wife goes to [employer]. He is constantly picking at his skin and they see [name] at [facility] for  management. He has created a bald spot from picking. He has some tremors. He stopping drinking alcohol and is not driving. Wife feels short-term memory is worsening.   Interval history 11/29/2016: Was taking flomax but had to stop due to blood pressure concerns and since then having difficulty urinating and urinating overnight in the bed.  Memory is stable, not significantly worse. PT at home is helping. He feels he sleeps well. No snoring. He sleeps at regular hours and gets 8 hours of sleep. Following with Dr. [name] in psychiatry. Ritalin helps with the sleepiness.    Interval history 05/16/2016: Spencer Howard returns for follow up. 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 Had a long discussion regarding MCI, causes and risks for progression to Alzheimers and other dementias. He has had 1-2 falls since appointment, worsening gait. He went to PT but he is not doing the recommended exercises at home. Falls, worsened cognition and imbalance, can;t leave the house alone, he is not driving anymore. They have several issues to discuss today:  Drinking for the last year: he drinks a 763ml bottle of wine every 2 days. Been ongoing. Recommend max 2 glasses a day. However stopping abruptly can cause withdrawal and risk of death. F/u with Dr. Osborne Casco  Inactivity and imbalance and falls: encouraged activity, silver sneakers. Home nursing and PT at home.  Self mutilation, extreme agitation, depression, death of daughter: Recommend psychiatry Dr. [name] at [facility], he needs medical management but they also need family counseling for his MCI likely of Alzheimer's type or depression or both.   Driving; recommend no driving  Incontinent: f/u with Dr. Osborne Casco  F/u Dr. Vikki Ports for review of neuropsych testing   Interval History 07/29/2015: Short term memory is worsening. He is highly dependent on his watch. He forgets relationships of  family members and grandchildren and great grandchildren. He is having balance problems, he staggers a lot, near falls, mostly in th emorning when he gets up, he gets up too fast and his blood pressure is low and gets dizzy. No numbness or tingling in the feet. Had recent bloodwork witll request from Dr. Osborne Casco.   HPI: Spencer Howard is a 84 y.o. male here as a referral from Dr. Osborne Casco for for dementia. PMHx memory loss, HTN, HLD, depression, hypothyroidism. Per a review of records, He has been experiencing short-term memory loss. Wife here with Spencer Howard and provides information. Spencer Howard does not recall conversations, repeatedly asks questions, forgets appointments and needs reminding every day of daily plans. He forgets to take his medications. He gets lost when he drives. He uses GPS now and wife is always with him. He got [place] golf [place]. Wife manages the finances. Symptoms started 3 years ago and slow progression. He did experience significant depression after the loss of his daughter. A component of depression/anxiety was suspected. As such, a complete neuropsychological evaluation including labs and brain imaging was ordered to differentiate depression from a neurodegenerative disorder. Brain imaging and blood work were unrevealing and the MRI of the brain showed only "stable chronic small vessel changes". He was placed on Lexapro for depression. 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 A thorough neuropsychological evaluation was performed and mild cognitive impairment was diagnosed and not suggestive of dementia at this time. He is on Aricept. Further, the psychological issues is not the cause of his cognitive difficulties however it affects his quality of life and exacerbates existing cognitive deficits.  He just moved into the area and is here to establish care. He is using [employer]. He is a very pleasant and conversant gentleman here with his wife who provides  just as much information as Spencer Howard does. He says he sometimes asks for directions. He moved and this has been stressful but they are thrilled to be here around family. Memory changes started 3 years ago. It has been very slowly progressive. More recent than remote memories. He does not know the plans for the day, he says the same things over and over again, asks the same questions over and over. He gets frutrated and swears more but this is not a change in personality, just a little worse than he has been in the past. He remebers in the 7th grade he used to get checks for self control. He is better with his depression. His pcp is managing his depression, he is better on the Lexapro. The other day he got really mad but again he has done this in the past. One glass of wine a day. He has some bouts of frustration or anger. He denies inappropriate bouts of crying or laughing or other pseudobulbar symptoms. He does get angry but this is not something new.   Reviewed notes, labs and imaging from outside physicians, which showed: Neuropsychological evaluation was performed in October 2016. [name] profile of findings  appears to be best characterized as mild cognitive impairment, amnestic type. He demonstrated mild difficulty with simple attention and performance on measures of psychomotor speed was variable. He was able to understand directions for most measures but he did have trouble comprehending instructions for some of the more complicated tasks. He did not demonstrate the disorientation, deficient abstract reasoning, concrete clock drawing, impaired categorical fluency, reduced judgment and poor mental flexibility often observed in people with cortical dementia. And although he experiences memory lapses and his uvula, he is not demonstrating generalized functional impairment. This despite his memory decline Mr. Nasca does not meet full criteria for cortical dementia i.e. Alzheimer's disease at this time.  However Mr. Marriott demonstrated greater memory loss that is typical for a person of his age. Small vessel ischemia would not cause the circumscribed memory deficits demonstrated by the Spencer Howard. 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 Repeat neuropsychological assessment in 12 months is strongly recommended to assist with differential diagnosis. This is particularly important given that amnestic mild cognitive impairment is increasingly thought to represent the early stages of dementia. Worsening of cognitive function in the context of stable cerebrovascular and overall health would provide strong evidence of a developing dementia.    REVIEW OF SYSTEMS: Out of a complete 14 system review of symptoms, the Spencer Howard complains only of the following symptoms, and all other reviewed systems are negative.  See HPI  ALLERGIES: No Known Allergies  HOME MEDICATIONS: Outpatient Medications Prior to Visit  Medication Sig Dispense Refill  . acetaminophen (TYLENOL) 325 MG tablet Take 650 mg by mouth every 6 (six) hours as needed.    Marland Kitchen alendronate (FOSAMAX) 70 MG tablet Take 70 mg by mouth every Monday.     . Calcium Citrate-Vitamin D (CALCIUM CITRATE + D3 PO) Take 1 tablet by mouth daily.    . Cholecalciferol (VITAMIN D) 2000 units tablet Take 2,000 Units by mouth daily.    . cloNIDine (CATAPRES) 0.1 MG tablet 0.1 mg. Every 8 hours as needed    . donepezil (ARICEPT) 10 MG tablet TAKE 1 TABLET TWICE A DAY 180 tablet 4  . levothyroxine (SYNTHROID, LEVOTHROID) 75 MCG tablet Take 1 tablet (75 mcg total) by mouth daily before breakfast. 30 tablet 0  . lisinopril (PRINIVIL,ZESTRIL) 2.5 MG tablet 2.5 mg daily.    Marland Kitchen LORazepam (ATIVAN) 0.5 MG tablet 0.5 mg at bedtime. As needed    . memantine (NAMENDA) 5 MG tablet Take 1 tablet (5 mg total) by mouth 2 (two) times daily. 180 tablet 11  . Multiple Vitamins-Minerals (CENTRUM SILVER 50+MEN)  TABS Take 1 tablet by mouth daily with breakfast.    . PARoxetine (PAXIL) 10 MG tablet Take 10 mg by mouth 2 (two) times daily.     . midodrine (PROAMATINE) 10 MG tablet Take 1 tablet (10 mg total) by mouth 3 (three) times daily. (Spencer Howard not taking: Reported on 12/04/2017) 270 tablet 3   No facility-administered medications prior to visit.     PAST MEDICAL HISTORY: Past Medical History:  Diagnosis Date  . Alcohol abuse with alcohol-induced mental disorder ([place])   . Dementia associated with alcoholism ([place])    Alzheimer's dementia versus alcohol related dementia. 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 Depression   . High cholesterol   . Hypertension   . Hypothyroidism (acquired)     PAST SURGICAL HISTORY: Past Surgical History:  Procedure Laterality Date  . ELBOW SURGERY  Left 1995   Broken  . NM MYOVIEW LTD  02/2016   Normal LV function - EF 60-65%. No EKG changes. No ischemia or infarction noted on imaging. LOW RISK  . TONSILLECTOMY  1942  . TRANSTHORACIC ECHOCARDIOGRAM  02/2016   Moderate concentric LVH with EF 55-60%. GR 1 DD. Mild LA dilation. Mildly increased PA pressures  . VARICOCELECTOMY  1967    FAMILY HISTORY: Family History  Problem Relation Age of Onset  . Leukemia Father   . Colon cancer Mother   . Dementia Neg Hx     SOCIAL HISTORY: Social History   Socioeconomic History  . Marital status: Married    Spouse name: susan  . Number of children: 2  . Years of education: 35  . Highest education level: Not on file  Occupational History  . Occupation: Retired    Comment: Retired Transport planner. 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 Financial resource strain: Not on file  . Food insecurity:    Worry: Not on file    Inability: Not on file  . Transportation needs:    Medical: Not on file    Non-medical: Not on file  Tobacco Use  . Smoking status: Former Smoker    Last attempt to quit: 03/28/1969    Years since quitting:  48.7  . Smokeless tobacco: Never Used  Substance and Sexual Activity  . Alcohol use: No    Alcohol/week: 0.0 standard drinks    Frequency: Never    Comment: Stopped August 2018  . Drug use: No  . Sexual activity: Not on file  Lifestyle  . Physical activity:    Days per week: Not on file    Minutes per session: Not on file  . Stress: Not on file  Relationships  . Social connections:    Talks on phone: Not on file    Gets together: Not on file    Attends religious service: Not on file    Active member of club or organization: Not on file    Attends meetings of clubs or organizations: Not on file    Relationship status: Not on file  . Intimate partner violence:    Fear of current or ex partner: Not on file    Emotionally abused: Not on file    Physically abused: Not on file    Forced sexual activity: Not on file  Other Topics Concern  . Not on file  Social History Narrative   Married father of 2, grandfather of 27, great grandfather of 34. He is married for 50 years.   Lives at home with wife.    Caffeine use: 1-2 cups per day   Drinks maybe 25-30 glasses of wine a week.= update: 3/4 None since August 2018   No routine exercise.   He grew up in [place] near [facility] right-handed, throws left-handed      PHYSICAL EXAM  Vitals:   12/04/17 1043  BP: 124/64  Pulse: 80   There is no height or weight on file to calculate BMI.   MMSE - Mini Mental State Exam 12/04/2017 05/29/2017 11/29/2016  Orientation to time 3 0 1  Orientation to Place 2 4 3   Registration 3 3 3   Attention/ Calculation 1 5 4   Recall 1 0 3  Language- name 2 objects 2 2 2   Language- repeat 0 0 0  Language- follow 3 step command 3 2 2   Language- read & follow direction 1  1 1  Write a sentence 1 1 1   Copy design 1 1 1   Total score 18 19 21      Generalized: Well developed, in no acute distress   Neurological examination  Mentation: Alert oriented to time, place, history taking. Follows  all commands speech and language fluent Cranial nerve II-XII:  Extraocular movements were full, visual field were full on confrontational test. Facial sensation and strength were normal. Uvula tongue midline. Head turning and shoulder shrug  were normal and symmetric. Motor: The motor testing reveals 5 over 5 strength of all 4 extremities. Good symmetric motor tone is noted throughout.  Sensory: Sensory testing is intact to soft touch on all 4 extremities. No evidence of extinction is noted.  Coordination: Cerebellar testing reveals good finger-nose-finger and heel-to-shin bilaterally.  Gait and station: Spencer Howard is in a wheelchair. Reflexes: Deep tendon reflexes are symmetric and normal bilaterally.   DIAGNOSTIC DATA (LABS, IMAGING, TESTING) - I reviewed Spencer Howard records, labs, notes, testing and imaging myself where available.  Lab Results  Component Value Date   WBC 5.1 10/25/2017   HGB 12.9 (L) 10/25/2017   HCT 40.4 10/25/2017   MCV 110.4 (H) 10/25/2017   PLT 231 10/25/2017      Component Value Date/Time   NA 139 10/25/2017 1009   NA 140 11/29/2016 1449   K 4.0 10/25/2017 1009   CL 103 10/25/2017 1009   CO2 25 10/25/2017 1009   GLUCOSE 173 (H) 10/25/2017 1009   BUN 13 10/25/2017 1009   BUN 21 11/29/2016 1449   CREATININE 1.23 10/25/2017 1009   CALCIUM 9.0 10/25/2017 1009   PROT 6.4 (L) 10/25/2017 1009   ALBUMIN 3.7 10/25/2017 1009   AST 34 10/25/2017 1009   ALT 14 10/25/2017 1009   ALKPHOS 61 10/25/2017 1009   BILITOT 1.2 10/25/2017 1009   GFRNONAA 51 (L) 10/25/2017 1009   GFRAA 59 (L) 10/25/2017 1009    Lab Results  Component Value Date   HRCBULAG53 646 09/17/2017   Lab Results  Component Value Date   TSH 7.461 (H) 09/17/2017      ASSESSMENT AND PLAN 84 y.o. year old male  has a past medical history of Alcohol abuse with alcohol-induced mental disorder ([place]), Dementia associated with alcoholism ([place]), Depression, High cholesterol, Hypertension, and  Hypothyroidism (acquired). here with:  1.  Dementia 2. Syncope events  The Spencer Howard's memory score has remained relatively the same.  He will continue on Aricept.  I will increase Namenda to 5 mg in the morning and 10 mg at bedtime.  He is advised that if he has any additional syncopal events they should let us know.  He will follow-up in 6 months or sooner if needed.    Ward Givens, MSN, NP-C 12/04/2017, 10:59 AM [facility] [address] [place], [place] [address] [phone]

## 2017-12-13 DIAGNOSIS — F331 Major depressive disorder, recurrent, moderate: Secondary | ICD-10-CM | POA: Diagnosis not present

## 2017-12-13 DIAGNOSIS — F0281 Dementia in other diseases classified elsewhere with behavioral disturbance: Secondary | ICD-10-CM | POA: Diagnosis not present

## 2017-12-13 DIAGNOSIS — G301 Alzheimer's disease with late onset: Secondary | ICD-10-CM | POA: Diagnosis not present

## 2017-12-13 DIAGNOSIS — F419 Anxiety disorder, unspecified: Secondary | ICD-10-CM | POA: Diagnosis not present

## 2017-12-18 DIAGNOSIS — I10 Essential (primary) hypertension: Secondary | ICD-10-CM | POA: Diagnosis not present

## 2017-12-27 DIAGNOSIS — F419 Anxiety disorder, unspecified: Secondary | ICD-10-CM | POA: Diagnosis not present

## 2017-12-29 DIAGNOSIS — F419 Anxiety disorder, unspecified: Secondary | ICD-10-CM | POA: Diagnosis not present

## 2017-12-29 DIAGNOSIS — R296 Repeated falls: Secondary | ICD-10-CM | POA: Diagnosis not present

## 2018-01-02 DIAGNOSIS — G309 Alzheimer's disease, unspecified: Secondary | ICD-10-CM | POA: Diagnosis not present

## 2018-01-02 DIAGNOSIS — I10 Essential (primary) hypertension: Secondary | ICD-10-CM | POA: Diagnosis not present

## 2018-01-02 DIAGNOSIS — M6281 Muscle weakness (generalized): Secondary | ICD-10-CM | POA: Diagnosis not present

## 2018-01-02 DIAGNOSIS — I951 Orthostatic hypotension: Secondary | ICD-10-CM | POA: Diagnosis not present

## 2018-01-02 DIAGNOSIS — E039 Hypothyroidism, unspecified: Secondary | ICD-10-CM | POA: Diagnosis not present

## 2018-01-02 DIAGNOSIS — F329 Major depressive disorder, single episode, unspecified: Secondary | ICD-10-CM | POA: Diagnosis not present

## 2018-01-02 DIAGNOSIS — M810 Age-related osteoporosis without current pathological fracture: Secondary | ICD-10-CM | POA: Diagnosis not present

## 2018-01-02 DIAGNOSIS — F0391 Unspecified dementia with behavioral disturbance: Secondary | ICD-10-CM | POA: Diagnosis not present

## 2018-01-02 DIAGNOSIS — R55 Syncope and collapse: Secondary | ICD-10-CM | POA: Diagnosis not present

## 2018-01-09 DIAGNOSIS — R319 Hematuria, unspecified: Secondary | ICD-10-CM | POA: Diagnosis not present

## 2018-01-09 DIAGNOSIS — N390 Urinary tract infection, site not specified: Secondary | ICD-10-CM | POA: Diagnosis not present

## 2018-01-16 DIAGNOSIS — F329 Major depressive disorder, single episode, unspecified: Secondary | ICD-10-CM | POA: Diagnosis not present

## 2018-01-16 DIAGNOSIS — F0390 Unspecified dementia without behavioral disturbance: Secondary | ICD-10-CM | POA: Diagnosis not present

## 2018-01-16 DIAGNOSIS — E785 Hyperlipidemia, unspecified: Secondary | ICD-10-CM | POA: Diagnosis not present

## 2018-01-16 DIAGNOSIS — D649 Anemia, unspecified: Secondary | ICD-10-CM | POA: Diagnosis not present

## 2018-01-16 DIAGNOSIS — R319 Hematuria, unspecified: Secondary | ICD-10-CM | POA: Diagnosis not present

## 2018-01-16 DIAGNOSIS — N390 Urinary tract infection, site not specified: Secondary | ICD-10-CM | POA: Diagnosis not present

## 2018-01-16 DIAGNOSIS — Z79891 Long term (current) use of opiate analgesic: Secondary | ICD-10-CM | POA: Diagnosis not present

## 2018-01-17 DIAGNOSIS — F331 Major depressive disorder, recurrent, moderate: Secondary | ICD-10-CM | POA: Diagnosis not present

## 2018-01-17 DIAGNOSIS — F0281 Dementia in other diseases classified elsewhere with behavioral disturbance: Secondary | ICD-10-CM | POA: Diagnosis not present

## 2018-01-17 DIAGNOSIS — F419 Anxiety disorder, unspecified: Secondary | ICD-10-CM | POA: Diagnosis not present

## 2018-01-17 DIAGNOSIS — G301 Alzheimer's disease with late onset: Secondary | ICD-10-CM | POA: Diagnosis not present

## 2018-01-22 DIAGNOSIS — F0391 Unspecified dementia with behavioral disturbance: Secondary | ICD-10-CM | POA: Diagnosis not present

## 2018-01-22 DIAGNOSIS — R569 Unspecified convulsions: Secondary | ICD-10-CM | POA: Diagnosis not present

## 2018-01-24 DIAGNOSIS — F0281 Dementia in other diseases classified elsewhere with behavioral disturbance: Secondary | ICD-10-CM | POA: Diagnosis not present

## 2018-01-24 DIAGNOSIS — G301 Alzheimer's disease with late onset: Secondary | ICD-10-CM | POA: Diagnosis not present

## 2018-01-24 DIAGNOSIS — F419 Anxiety disorder, unspecified: Secondary | ICD-10-CM | POA: Diagnosis not present

## 2018-01-24 DIAGNOSIS — F331 Major depressive disorder, recurrent, moderate: Secondary | ICD-10-CM | POA: Diagnosis not present

## 2018-02-07 DIAGNOSIS — F331 Major depressive disorder, recurrent, moderate: Secondary | ICD-10-CM | POA: Diagnosis not present

## 2018-02-07 DIAGNOSIS — F419 Anxiety disorder, unspecified: Secondary | ICD-10-CM | POA: Diagnosis not present

## 2018-02-07 DIAGNOSIS — G301 Alzheimer's disease with late onset: Secondary | ICD-10-CM | POA: Diagnosis not present

## 2018-02-07 DIAGNOSIS — F0281 Dementia in other diseases classified elsewhere with behavioral disturbance: Secondary | ICD-10-CM | POA: Diagnosis not present

## 2018-02-15 DIAGNOSIS — Z79899 Other long term (current) drug therapy: Secondary | ICD-10-CM | POA: Diagnosis not present

## 2018-02-15 DIAGNOSIS — F329 Major depressive disorder, single episode, unspecified: Secondary | ICD-10-CM | POA: Diagnosis not present

## 2018-02-15 DIAGNOSIS — R569 Unspecified convulsions: Secondary | ICD-10-CM | POA: Diagnosis not present

## 2018-03-01 DIAGNOSIS — F321 Major depressive disorder, single episode, moderate: Secondary | ICD-10-CM | POA: Diagnosis not present

## 2018-03-01 DIAGNOSIS — F0391 Unspecified dementia with behavioral disturbance: Secondary | ICD-10-CM | POA: Diagnosis not present

## 2018-03-01 DIAGNOSIS — F419 Anxiety disorder, unspecified: Secondary | ICD-10-CM | POA: Diagnosis not present

## 2018-03-07 DIAGNOSIS — G301 Alzheimer's disease with late onset: Secondary | ICD-10-CM | POA: Diagnosis not present

## 2018-03-07 DIAGNOSIS — F419 Anxiety disorder, unspecified: Secondary | ICD-10-CM | POA: Diagnosis not present

## 2018-03-07 DIAGNOSIS — F0281 Dementia in other diseases classified elsewhere with behavioral disturbance: Secondary | ICD-10-CM | POA: Diagnosis not present

## 2018-03-07 DIAGNOSIS — F331 Major depressive disorder, recurrent, moderate: Secondary | ICD-10-CM | POA: Diagnosis not present

## 2018-03-08 DIAGNOSIS — Z79899 Other long term (current) drug therapy: Secondary | ICD-10-CM | POA: Diagnosis not present

## 2018-03-08 DIAGNOSIS — E039 Hypothyroidism, unspecified: Secondary | ICD-10-CM | POA: Diagnosis not present

## 2018-03-28 DIAGNOSIS — F419 Anxiety disorder, unspecified: Secondary | ICD-10-CM | POA: Diagnosis not present

## 2018-03-30 DIAGNOSIS — R05 Cough: Secondary | ICD-10-CM | POA: Diagnosis not present

## 2018-04-11 DIAGNOSIS — G301 Alzheimer's disease with late onset: Secondary | ICD-10-CM | POA: Diagnosis not present

## 2018-04-11 DIAGNOSIS — F0281 Dementia in other diseases classified elsewhere with behavioral disturbance: Secondary | ICD-10-CM | POA: Diagnosis not present

## 2018-04-11 DIAGNOSIS — F331 Major depressive disorder, recurrent, moderate: Secondary | ICD-10-CM | POA: Diagnosis not present

## 2018-04-11 DIAGNOSIS — F064 Anxiety disorder due to known physiological condition: Secondary | ICD-10-CM | POA: Diagnosis not present

## 2018-04-12 DIAGNOSIS — F329 Major depressive disorder, single episode, unspecified: Secondary | ICD-10-CM | POA: Diagnosis not present

## 2018-04-12 DIAGNOSIS — F419 Anxiety disorder, unspecified: Secondary | ICD-10-CM | POA: Diagnosis not present

## 2018-04-12 DIAGNOSIS — G8929 Other chronic pain: Secondary | ICD-10-CM | POA: Diagnosis not present

## 2018-04-12 DIAGNOSIS — R54 Age-related physical debility: Secondary | ICD-10-CM | POA: Diagnosis not present

## 2018-04-12 DIAGNOSIS — M810 Age-related osteoporosis without current pathological fracture: Secondary | ICD-10-CM | POA: Diagnosis not present

## 2018-04-12 DIAGNOSIS — E039 Hypothyroidism, unspecified: Secondary | ICD-10-CM | POA: Diagnosis not present

## 2018-04-12 DIAGNOSIS — G309 Alzheimer's disease, unspecified: Secondary | ICD-10-CM | POA: Diagnosis not present

## 2018-04-12 DIAGNOSIS — F0391 Unspecified dementia with behavioral disturbance: Secondary | ICD-10-CM | POA: Diagnosis not present

## 2018-04-12 DIAGNOSIS — I10 Essential (primary) hypertension: Secondary | ICD-10-CM | POA: Diagnosis not present

## 2018-04-16 DIAGNOSIS — E039 Hypothyroidism, unspecified: Secondary | ICD-10-CM | POA: Diagnosis not present

## 2018-04-16 DIAGNOSIS — Z0000 Encounter for general adult medical examination without abnormal findings: Secondary | ICD-10-CM | POA: Diagnosis not present

## 2018-04-25 DIAGNOSIS — F419 Anxiety disorder, unspecified: Secondary | ICD-10-CM | POA: Diagnosis not present

## 2018-05-03 DIAGNOSIS — H833X3 Noise effects on inner ear, bilateral: Secondary | ICD-10-CM | POA: Diagnosis not present

## 2018-05-03 DIAGNOSIS — H903 Sensorineural hearing loss, bilateral: Secondary | ICD-10-CM | POA: Diagnosis not present

## 2018-05-09 DIAGNOSIS — F064 Anxiety disorder due to known physiological condition: Secondary | ICD-10-CM | POA: Diagnosis not present

## 2018-05-09 DIAGNOSIS — F331 Major depressive disorder, recurrent, moderate: Secondary | ICD-10-CM | POA: Diagnosis not present

## 2018-05-09 DIAGNOSIS — F0281 Dementia in other diseases classified elsewhere with behavioral disturbance: Secondary | ICD-10-CM | POA: Diagnosis not present

## 2018-05-09 DIAGNOSIS — G301 Alzheimer's disease with late onset: Secondary | ICD-10-CM | POA: Diagnosis not present

## 2018-05-18 DIAGNOSIS — F0391 Unspecified dementia with behavioral disturbance: Secondary | ICD-10-CM | POA: Diagnosis not present

## 2018-05-18 DIAGNOSIS — F419 Anxiety disorder, unspecified: Secondary | ICD-10-CM | POA: Diagnosis not present

## 2018-05-18 DIAGNOSIS — F329 Major depressive disorder, single episode, unspecified: Secondary | ICD-10-CM | POA: Diagnosis not present

## 2018-05-18 DIAGNOSIS — E039 Hypothyroidism, unspecified: Secondary | ICD-10-CM | POA: Diagnosis not present

## 2018-05-18 DIAGNOSIS — G309 Alzheimer's disease, unspecified: Secondary | ICD-10-CM | POA: Diagnosis not present

## 2018-05-18 DIAGNOSIS — I10 Essential (primary) hypertension: Secondary | ICD-10-CM | POA: Diagnosis not present

## 2018-05-18 DIAGNOSIS — R54 Age-related physical debility: Secondary | ICD-10-CM | POA: Diagnosis not present

## 2018-05-18 DIAGNOSIS — M810 Age-related osteoporosis without current pathological fracture: Secondary | ICD-10-CM | POA: Diagnosis not present

## 2018-05-18 DIAGNOSIS — G8929 Other chronic pain: Secondary | ICD-10-CM | POA: Diagnosis not present

## 2018-05-23 DIAGNOSIS — F419 Anxiety disorder, unspecified: Secondary | ICD-10-CM | POA: Diagnosis not present

## 2018-06-06 DIAGNOSIS — F064 Anxiety disorder due to known physiological condition: Secondary | ICD-10-CM | POA: Diagnosis not present

## 2018-06-06 DIAGNOSIS — F331 Major depressive disorder, recurrent, moderate: Secondary | ICD-10-CM | POA: Diagnosis not present

## 2018-06-06 DIAGNOSIS — F0281 Dementia in other diseases classified elsewhere with behavioral disturbance: Secondary | ICD-10-CM | POA: Diagnosis not present

## 2018-06-06 DIAGNOSIS — G301 Alzheimer's disease with late onset: Secondary | ICD-10-CM | POA: Diagnosis not present

## 2018-06-07 DIAGNOSIS — F0391 Unspecified dementia with behavioral disturbance: Secondary | ICD-10-CM | POA: Diagnosis not present

## 2018-06-07 DIAGNOSIS — G8929 Other chronic pain: Secondary | ICD-10-CM | POA: Diagnosis not present

## 2018-06-07 DIAGNOSIS — R54 Age-related physical debility: Secondary | ICD-10-CM | POA: Diagnosis not present

## 2018-06-07 DIAGNOSIS — I10 Essential (primary) hypertension: Secondary | ICD-10-CM | POA: Diagnosis not present

## 2018-06-07 DIAGNOSIS — G309 Alzheimer's disease, unspecified: Secondary | ICD-10-CM | POA: Diagnosis not present

## 2018-06-07 DIAGNOSIS — R569 Unspecified convulsions: Secondary | ICD-10-CM | POA: Diagnosis not present

## 2018-06-07 DIAGNOSIS — D649 Anemia, unspecified: Secondary | ICD-10-CM | POA: Diagnosis not present

## 2018-06-07 DIAGNOSIS — E039 Hypothyroidism, unspecified: Secondary | ICD-10-CM | POA: Diagnosis not present

## 2018-06-07 DIAGNOSIS — F419 Anxiety disorder, unspecified: Secondary | ICD-10-CM | POA: Diagnosis not present

## 2018-06-07 DIAGNOSIS — F329 Major depressive disorder, single episode, unspecified: Secondary | ICD-10-CM | POA: Diagnosis not present

## 2018-06-07 DIAGNOSIS — M810 Age-related osteoporosis without current pathological fracture: Secondary | ICD-10-CM | POA: Diagnosis not present

## 2018-06-08 DIAGNOSIS — E559 Vitamin D deficiency, unspecified: Secondary | ICD-10-CM | POA: Diagnosis not present

## 2018-06-08 DIAGNOSIS — E538 Deficiency of other specified B group vitamins: Secondary | ICD-10-CM | POA: Diagnosis not present

## 2018-06-27 DIAGNOSIS — F0281 Dementia in other diseases classified elsewhere with behavioral disturbance: Secondary | ICD-10-CM | POA: Diagnosis not present

## 2018-06-27 DIAGNOSIS — G301 Alzheimer's disease with late onset: Secondary | ICD-10-CM | POA: Diagnosis not present

## 2018-06-27 DIAGNOSIS — F331 Major depressive disorder, recurrent, moderate: Secondary | ICD-10-CM | POA: Diagnosis not present

## 2018-06-27 DIAGNOSIS — F064 Anxiety disorder due to known physiological condition: Secondary | ICD-10-CM | POA: Diagnosis not present

## 2018-07-04 DIAGNOSIS — S0182XA Laceration with foreign body of other part of head, initial encounter: Secondary | ICD-10-CM | POA: Diagnosis not present

## 2018-07-11 DIAGNOSIS — D649 Anemia, unspecified: Secondary | ICD-10-CM | POA: Diagnosis not present

## 2018-07-11 DIAGNOSIS — M810 Age-related osteoporosis without current pathological fracture: Secondary | ICD-10-CM | POA: Diagnosis not present

## 2018-07-11 DIAGNOSIS — E039 Hypothyroidism, unspecified: Secondary | ICD-10-CM | POA: Diagnosis not present

## 2018-07-11 DIAGNOSIS — F419 Anxiety disorder, unspecified: Secondary | ICD-10-CM | POA: Diagnosis not present

## 2018-07-11 DIAGNOSIS — I10 Essential (primary) hypertension: Secondary | ICD-10-CM | POA: Diagnosis not present

## 2018-07-11 DIAGNOSIS — R54 Age-related physical debility: Secondary | ICD-10-CM | POA: Diagnosis not present

## 2018-07-11 DIAGNOSIS — G309 Alzheimer's disease, unspecified: Secondary | ICD-10-CM | POA: Diagnosis not present

## 2018-07-11 DIAGNOSIS — G8929 Other chronic pain: Secondary | ICD-10-CM | POA: Diagnosis not present

## 2018-07-11 DIAGNOSIS — S0182XA Laceration with foreign body of other part of head, initial encounter: Secondary | ICD-10-CM | POA: Diagnosis not present

## 2018-07-11 DIAGNOSIS — F329 Major depressive disorder, single episode, unspecified: Secondary | ICD-10-CM | POA: Diagnosis not present

## 2018-07-11 DIAGNOSIS — F0391 Unspecified dementia with behavioral disturbance: Secondary | ICD-10-CM | POA: Diagnosis not present

## 2018-07-25 DIAGNOSIS — M6281 Muscle weakness (generalized): Secondary | ICD-10-CM | POA: Diagnosis not present

## 2018-07-26 DIAGNOSIS — G309 Alzheimer's disease, unspecified: Secondary | ICD-10-CM | POA: Diagnosis not present

## 2018-07-26 DIAGNOSIS — R55 Syncope and collapse: Secondary | ICD-10-CM | POA: Diagnosis not present

## 2018-07-26 DIAGNOSIS — M6281 Muscle weakness (generalized): Secondary | ICD-10-CM | POA: Diagnosis not present

## 2018-07-26 DIAGNOSIS — E039 Hypothyroidism, unspecified: Secondary | ICD-10-CM | POA: Diagnosis not present

## 2018-07-26 DIAGNOSIS — I519 Heart disease, unspecified: Secondary | ICD-10-CM | POA: Diagnosis not present

## 2018-07-26 DIAGNOSIS — I4891 Unspecified atrial fibrillation: Secondary | ICD-10-CM | POA: Diagnosis not present

## 2018-07-27 DIAGNOSIS — E538 Deficiency of other specified B group vitamins: Secondary | ICD-10-CM | POA: Diagnosis not present

## 2018-07-27 DIAGNOSIS — D649 Anemia, unspecified: Secondary | ICD-10-CM | POA: Diagnosis not present

## 2018-07-27 DIAGNOSIS — I10 Essential (primary) hypertension: Secondary | ICD-10-CM | POA: Diagnosis not present

## 2018-07-27 DIAGNOSIS — M6281 Muscle weakness (generalized): Secondary | ICD-10-CM | POA: Diagnosis not present

## 2018-07-27 DIAGNOSIS — I4891 Unspecified atrial fibrillation: Secondary | ICD-10-CM | POA: Diagnosis not present

## 2018-07-27 DIAGNOSIS — G309 Alzheimer's disease, unspecified: Secondary | ICD-10-CM | POA: Diagnosis not present

## 2018-07-27 DIAGNOSIS — I519 Heart disease, unspecified: Secondary | ICD-10-CM | POA: Diagnosis not present

## 2018-07-27 DIAGNOSIS — E039 Hypothyroidism, unspecified: Secondary | ICD-10-CM | POA: Diagnosis not present

## 2018-07-30 DIAGNOSIS — G309 Alzheimer's disease, unspecified: Secondary | ICD-10-CM | POA: Diagnosis not present

## 2018-07-30 DIAGNOSIS — M6281 Muscle weakness (generalized): Secondary | ICD-10-CM | POA: Diagnosis not present

## 2018-07-30 DIAGNOSIS — I519 Heart disease, unspecified: Secondary | ICD-10-CM | POA: Diagnosis not present

## 2018-07-31 DIAGNOSIS — M6281 Muscle weakness (generalized): Secondary | ICD-10-CM | POA: Diagnosis not present

## 2018-07-31 DIAGNOSIS — F0391 Unspecified dementia with behavioral disturbance: Secondary | ICD-10-CM | POA: Diagnosis not present

## 2018-07-31 DIAGNOSIS — F329 Major depressive disorder, single episode, unspecified: Secondary | ICD-10-CM | POA: Diagnosis not present

## 2018-07-31 DIAGNOSIS — I10 Essential (primary) hypertension: Secondary | ICD-10-CM | POA: Diagnosis not present

## 2018-07-31 DIAGNOSIS — I4891 Unspecified atrial fibrillation: Secondary | ICD-10-CM | POA: Diagnosis not present

## 2018-07-31 DIAGNOSIS — G309 Alzheimer's disease, unspecified: Secondary | ICD-10-CM | POA: Diagnosis not present

## 2018-07-31 DIAGNOSIS — D649 Anemia, unspecified: Secondary | ICD-10-CM | POA: Diagnosis not present

## 2018-07-31 DIAGNOSIS — M810 Age-related osteoporosis without current pathological fracture: Secondary | ICD-10-CM | POA: Diagnosis not present

## 2018-07-31 DIAGNOSIS — G8929 Other chronic pain: Secondary | ICD-10-CM | POA: Diagnosis not present

## 2018-07-31 DIAGNOSIS — F419 Anxiety disorder, unspecified: Secondary | ICD-10-CM | POA: Diagnosis not present

## 2018-07-31 DIAGNOSIS — R54 Age-related physical debility: Secondary | ICD-10-CM | POA: Diagnosis not present

## 2018-07-31 DIAGNOSIS — I519 Heart disease, unspecified: Secondary | ICD-10-CM | POA: Diagnosis not present

## 2018-07-31 DIAGNOSIS — E039 Hypothyroidism, unspecified: Secondary | ICD-10-CM | POA: Diagnosis not present

## 2018-08-01 DIAGNOSIS — F0281 Dementia in other diseases classified elsewhere with behavioral disturbance: Secondary | ICD-10-CM | POA: Diagnosis not present

## 2018-08-01 DIAGNOSIS — I519 Heart disease, unspecified: Secondary | ICD-10-CM | POA: Diagnosis not present

## 2018-08-01 DIAGNOSIS — G301 Alzheimer's disease with late onset: Secondary | ICD-10-CM | POA: Diagnosis not present

## 2018-08-01 DIAGNOSIS — G309 Alzheimer's disease, unspecified: Secondary | ICD-10-CM | POA: Diagnosis not present

## 2018-08-01 DIAGNOSIS — F064 Anxiety disorder due to known physiological condition: Secondary | ICD-10-CM | POA: Diagnosis not present

## 2018-08-01 DIAGNOSIS — M6281 Muscle weakness (generalized): Secondary | ICD-10-CM | POA: Diagnosis not present

## 2018-08-01 DIAGNOSIS — F331 Major depressive disorder, recurrent, moderate: Secondary | ICD-10-CM | POA: Diagnosis not present

## 2018-08-02 DIAGNOSIS — I519 Heart disease, unspecified: Secondary | ICD-10-CM | POA: Diagnosis not present

## 2018-08-02 DIAGNOSIS — M6281 Muscle weakness (generalized): Secondary | ICD-10-CM | POA: Diagnosis not present

## 2018-08-02 DIAGNOSIS — G309 Alzheimer's disease, unspecified: Secondary | ICD-10-CM | POA: Diagnosis not present

## 2018-08-06 DIAGNOSIS — I519 Heart disease, unspecified: Secondary | ICD-10-CM | POA: Diagnosis not present

## 2018-08-06 DIAGNOSIS — G309 Alzheimer's disease, unspecified: Secondary | ICD-10-CM | POA: Diagnosis not present

## 2018-08-06 DIAGNOSIS — M6281 Muscle weakness (generalized): Secondary | ICD-10-CM | POA: Diagnosis not present

## 2018-08-07 DIAGNOSIS — M6281 Muscle weakness (generalized): Secondary | ICD-10-CM | POA: Diagnosis not present

## 2018-08-07 DIAGNOSIS — I519 Heart disease, unspecified: Secondary | ICD-10-CM | POA: Diagnosis not present

## 2018-08-07 DIAGNOSIS — G309 Alzheimer's disease, unspecified: Secondary | ICD-10-CM | POA: Diagnosis not present

## 2018-08-08 DIAGNOSIS — G309 Alzheimer's disease, unspecified: Secondary | ICD-10-CM | POA: Diagnosis not present

## 2018-08-08 DIAGNOSIS — I519 Heart disease, unspecified: Secondary | ICD-10-CM | POA: Diagnosis not present

## 2018-08-08 DIAGNOSIS — M6281 Muscle weakness (generalized): Secondary | ICD-10-CM | POA: Diagnosis not present

## 2018-08-09 DIAGNOSIS — G309 Alzheimer's disease, unspecified: Secondary | ICD-10-CM | POA: Diagnosis not present

## 2018-08-09 DIAGNOSIS — I519 Heart disease, unspecified: Secondary | ICD-10-CM | POA: Diagnosis not present

## 2018-08-09 DIAGNOSIS — M6281 Muscle weakness (generalized): Secondary | ICD-10-CM | POA: Diagnosis not present

## 2018-08-10 DIAGNOSIS — G309 Alzheimer's disease, unspecified: Secondary | ICD-10-CM | POA: Diagnosis not present

## 2018-08-10 DIAGNOSIS — M6281 Muscle weakness (generalized): Secondary | ICD-10-CM | POA: Diagnosis not present

## 2018-08-10 DIAGNOSIS — I519 Heart disease, unspecified: Secondary | ICD-10-CM | POA: Diagnosis not present

## 2018-08-13 DIAGNOSIS — M6281 Muscle weakness (generalized): Secondary | ICD-10-CM | POA: Diagnosis not present

## 2018-08-13 DIAGNOSIS — I519 Heart disease, unspecified: Secondary | ICD-10-CM | POA: Diagnosis not present

## 2018-08-13 DIAGNOSIS — G309 Alzheimer's disease, unspecified: Secondary | ICD-10-CM | POA: Diagnosis not present

## 2018-08-14 DIAGNOSIS — M6281 Muscle weakness (generalized): Secondary | ICD-10-CM | POA: Diagnosis not present

## 2018-08-14 DIAGNOSIS — G309 Alzheimer's disease, unspecified: Secondary | ICD-10-CM | POA: Diagnosis not present

## 2018-08-14 DIAGNOSIS — I519 Heart disease, unspecified: Secondary | ICD-10-CM | POA: Diagnosis not present

## 2018-08-15 ENCOUNTER — Ambulatory Visit: Payer: Self-pay | Admitting: Adult Health

## 2018-08-15 ENCOUNTER — Ambulatory Visit: Payer: Self-pay | Admitting: Family Medicine

## 2018-08-15 DIAGNOSIS — F062 Psychotic disorder with delusions due to known physiological condition: Secondary | ICD-10-CM | POA: Diagnosis not present

## 2018-08-15 DIAGNOSIS — F331 Major depressive disorder, recurrent, moderate: Secondary | ICD-10-CM | POA: Diagnosis not present

## 2018-08-15 DIAGNOSIS — M6281 Muscle weakness (generalized): Secondary | ICD-10-CM | POA: Diagnosis not present

## 2018-08-15 DIAGNOSIS — G309 Alzheimer's disease, unspecified: Secondary | ICD-10-CM | POA: Diagnosis not present

## 2018-08-15 DIAGNOSIS — I519 Heart disease, unspecified: Secondary | ICD-10-CM | POA: Diagnosis not present

## 2018-08-15 DIAGNOSIS — G301 Alzheimer's disease with late onset: Secondary | ICD-10-CM | POA: Diagnosis not present

## 2018-08-15 DIAGNOSIS — F0281 Dementia in other diseases classified elsewhere with behavioral disturbance: Secondary | ICD-10-CM | POA: Diagnosis not present

## 2018-08-15 DIAGNOSIS — F064 Anxiety disorder due to known physiological condition: Secondary | ICD-10-CM | POA: Diagnosis not present

## 2018-08-16 DIAGNOSIS — I519 Heart disease, unspecified: Secondary | ICD-10-CM | POA: Diagnosis not present

## 2018-08-16 DIAGNOSIS — G309 Alzheimer's disease, unspecified: Secondary | ICD-10-CM | POA: Diagnosis not present

## 2018-08-16 DIAGNOSIS — M6281 Muscle weakness (generalized): Secondary | ICD-10-CM | POA: Diagnosis not present

## 2018-08-17 DIAGNOSIS — M6281 Muscle weakness (generalized): Secondary | ICD-10-CM | POA: Diagnosis not present

## 2018-08-17 DIAGNOSIS — I519 Heart disease, unspecified: Secondary | ICD-10-CM | POA: Diagnosis not present

## 2018-08-17 DIAGNOSIS — G309 Alzheimer's disease, unspecified: Secondary | ICD-10-CM | POA: Diagnosis not present

## 2018-08-20 DIAGNOSIS — M6281 Muscle weakness (generalized): Secondary | ICD-10-CM | POA: Diagnosis not present

## 2018-08-20 DIAGNOSIS — I519 Heart disease, unspecified: Secondary | ICD-10-CM | POA: Diagnosis not present

## 2018-08-20 DIAGNOSIS — G309 Alzheimer's disease, unspecified: Secondary | ICD-10-CM | POA: Diagnosis not present

## 2018-08-21 DIAGNOSIS — R569 Unspecified convulsions: Secondary | ICD-10-CM | POA: Diagnosis not present

## 2018-08-21 DIAGNOSIS — M6281 Muscle weakness (generalized): Secondary | ICD-10-CM | POA: Diagnosis not present

## 2018-08-21 DIAGNOSIS — G309 Alzheimer's disease, unspecified: Secondary | ICD-10-CM | POA: Diagnosis not present

## 2018-08-21 DIAGNOSIS — I519 Heart disease, unspecified: Secondary | ICD-10-CM | POA: Diagnosis not present

## 2018-08-21 DIAGNOSIS — F0391 Unspecified dementia with behavioral disturbance: Secondary | ICD-10-CM | POA: Diagnosis not present

## 2018-08-22 DIAGNOSIS — G309 Alzheimer's disease, unspecified: Secondary | ICD-10-CM | POA: Diagnosis not present

## 2018-08-22 DIAGNOSIS — I519 Heart disease, unspecified: Secondary | ICD-10-CM | POA: Diagnosis not present

## 2018-08-22 DIAGNOSIS — M6281 Muscle weakness (generalized): Secondary | ICD-10-CM | POA: Diagnosis not present

## 2018-08-23 DIAGNOSIS — Z79899 Other long term (current) drug therapy: Secondary | ICD-10-CM | POA: Diagnosis not present

## 2018-08-23 DIAGNOSIS — R569 Unspecified convulsions: Secondary | ICD-10-CM | POA: Diagnosis not present

## 2018-09-12 DIAGNOSIS — F0281 Dementia in other diseases classified elsewhere with behavioral disturbance: Secondary | ICD-10-CM | POA: Diagnosis not present

## 2018-09-12 DIAGNOSIS — F062 Psychotic disorder with delusions due to known physiological condition: Secondary | ICD-10-CM | POA: Diagnosis not present

## 2018-09-12 DIAGNOSIS — F331 Major depressive disorder, recurrent, moderate: Secondary | ICD-10-CM | POA: Diagnosis not present

## 2018-09-12 DIAGNOSIS — F064 Anxiety disorder due to known physiological condition: Secondary | ICD-10-CM | POA: Diagnosis not present

## 2018-09-12 DIAGNOSIS — G301 Alzheimer's disease with late onset: Secondary | ICD-10-CM | POA: Diagnosis not present

## 2018-09-13 DIAGNOSIS — R569 Unspecified convulsions: Secondary | ICD-10-CM | POA: Diagnosis not present

## 2018-09-13 DIAGNOSIS — Z79899 Other long term (current) drug therapy: Secondary | ICD-10-CM | POA: Diagnosis not present

## 2018-09-26 DIAGNOSIS — F0281 Dementia in other diseases classified elsewhere with behavioral disturbance: Secondary | ICD-10-CM | POA: Diagnosis not present

## 2018-09-26 DIAGNOSIS — G301 Alzheimer's disease with late onset: Secondary | ICD-10-CM | POA: Diagnosis not present

## 2018-09-26 DIAGNOSIS — F064 Anxiety disorder due to known physiological condition: Secondary | ICD-10-CM | POA: Diagnosis not present

## 2018-09-26 DIAGNOSIS — F062 Psychotic disorder with delusions due to known physiological condition: Secondary | ICD-10-CM | POA: Diagnosis not present

## 2018-09-26 DIAGNOSIS — F331 Major depressive disorder, recurrent, moderate: Secondary | ICD-10-CM | POA: Diagnosis not present

## 2018-10-02 DIAGNOSIS — E039 Hypothyroidism, unspecified: Secondary | ICD-10-CM | POA: Diagnosis not present

## 2018-10-02 DIAGNOSIS — I10 Essential (primary) hypertension: Secondary | ICD-10-CM | POA: Diagnosis not present

## 2018-10-02 DIAGNOSIS — F419 Anxiety disorder, unspecified: Secondary | ICD-10-CM | POA: Diagnosis not present

## 2018-10-02 DIAGNOSIS — F329 Major depressive disorder, single episode, unspecified: Secondary | ICD-10-CM | POA: Diagnosis not present

## 2018-10-02 DIAGNOSIS — R54 Age-related physical debility: Secondary | ICD-10-CM | POA: Diagnosis not present

## 2018-10-02 DIAGNOSIS — I4891 Unspecified atrial fibrillation: Secondary | ICD-10-CM | POA: Diagnosis not present

## 2018-10-02 DIAGNOSIS — D649 Anemia, unspecified: Secondary | ICD-10-CM | POA: Diagnosis not present

## 2018-10-02 DIAGNOSIS — R5383 Other fatigue: Secondary | ICD-10-CM | POA: Diagnosis not present

## 2018-10-02 DIAGNOSIS — F0391 Unspecified dementia with behavioral disturbance: Secondary | ICD-10-CM | POA: Diagnosis not present

## 2018-10-02 DIAGNOSIS — G8929 Other chronic pain: Secondary | ICD-10-CM | POA: Diagnosis not present

## 2018-10-02 DIAGNOSIS — M810 Age-related osteoporosis without current pathological fracture: Secondary | ICD-10-CM | POA: Diagnosis not present

## 2018-10-02 DIAGNOSIS — E559 Vitamin D deficiency, unspecified: Secondary | ICD-10-CM | POA: Diagnosis not present

## 2018-10-08 DIAGNOSIS — E039 Hypothyroidism, unspecified: Secondary | ICD-10-CM | POA: Diagnosis not present

## 2018-10-08 DIAGNOSIS — I10 Essential (primary) hypertension: Secondary | ICD-10-CM | POA: Diagnosis not present

## 2018-10-08 DIAGNOSIS — F419 Anxiety disorder, unspecified: Secondary | ICD-10-CM | POA: Diagnosis not present

## 2018-10-08 DIAGNOSIS — F0391 Unspecified dementia with behavioral disturbance: Secondary | ICD-10-CM | POA: Diagnosis not present

## 2018-10-10 DIAGNOSIS — F064 Anxiety disorder due to known physiological condition: Secondary | ICD-10-CM | POA: Diagnosis not present

## 2018-10-12 DIAGNOSIS — R569 Unspecified convulsions: Secondary | ICD-10-CM | POA: Diagnosis not present

## 2018-10-12 DIAGNOSIS — Z79899 Other long term (current) drug therapy: Secondary | ICD-10-CM | POA: Diagnosis not present

## 2018-10-17 DIAGNOSIS — M6281 Muscle weakness (generalized): Secondary | ICD-10-CM | POA: Diagnosis not present

## 2018-10-17 DIAGNOSIS — I10 Essential (primary) hypertension: Secondary | ICD-10-CM | POA: Diagnosis not present

## 2018-10-17 DIAGNOSIS — E039 Hypothyroidism, unspecified: Secondary | ICD-10-CM | POA: Diagnosis not present

## 2018-10-17 DIAGNOSIS — F419 Anxiety disorder, unspecified: Secondary | ICD-10-CM | POA: Diagnosis not present

## 2018-10-17 DIAGNOSIS — F0391 Unspecified dementia with behavioral disturbance: Secondary | ICD-10-CM | POA: Diagnosis not present

## 2018-10-17 DIAGNOSIS — E559 Vitamin D deficiency, unspecified: Secondary | ICD-10-CM | POA: Diagnosis not present

## 2018-10-24 DIAGNOSIS — F0281 Dementia in other diseases classified elsewhere with behavioral disturbance: Secondary | ICD-10-CM | POA: Diagnosis not present

## 2018-10-24 DIAGNOSIS — F331 Major depressive disorder, recurrent, moderate: Secondary | ICD-10-CM | POA: Diagnosis not present

## 2018-10-24 DIAGNOSIS — F062 Psychotic disorder with delusions due to known physiological condition: Secondary | ICD-10-CM | POA: Diagnosis not present

## 2018-10-24 DIAGNOSIS — F064 Anxiety disorder due to known physiological condition: Secondary | ICD-10-CM | POA: Diagnosis not present

## 2018-10-24 DIAGNOSIS — G301 Alzheimer's disease with late onset: Secondary | ICD-10-CM | POA: Diagnosis not present

## 2018-11-14 DIAGNOSIS — R55 Syncope and collapse: Secondary | ICD-10-CM | POA: Diagnosis not present

## 2018-11-15 DIAGNOSIS — D649 Anemia, unspecified: Secondary | ICD-10-CM | POA: Diagnosis not present

## 2018-11-15 DIAGNOSIS — R5383 Other fatigue: Secondary | ICD-10-CM | POA: Diagnosis not present

## 2018-11-21 DIAGNOSIS — F062 Psychotic disorder with delusions due to known physiological condition: Secondary | ICD-10-CM | POA: Diagnosis not present

## 2018-11-21 DIAGNOSIS — G301 Alzheimer's disease with late onset: Secondary | ICD-10-CM | POA: Diagnosis not present

## 2018-11-21 DIAGNOSIS — F331 Major depressive disorder, recurrent, moderate: Secondary | ICD-10-CM | POA: Diagnosis not present

## 2018-11-21 DIAGNOSIS — F064 Anxiety disorder due to known physiological condition: Secondary | ICD-10-CM | POA: Diagnosis not present

## 2018-11-21 DIAGNOSIS — F0281 Dementia in other diseases classified elsewhere with behavioral disturbance: Secondary | ICD-10-CM | POA: Diagnosis not present

## 2018-11-22 DIAGNOSIS — Z03818 Encounter for observation for suspected exposure to other biological agents ruled out: Secondary | ICD-10-CM | POA: Diagnosis not present

## 2018-11-27 DIAGNOSIS — R634 Abnormal weight loss: Secondary | ICD-10-CM | POA: Diagnosis not present

## 2018-11-27 DIAGNOSIS — E039 Hypothyroidism, unspecified: Secondary | ICD-10-CM | POA: Diagnosis not present

## 2018-11-27 DIAGNOSIS — N184 Chronic kidney disease, stage 4 (severe): Secondary | ICD-10-CM | POA: Diagnosis not present

## 2018-11-27 DIAGNOSIS — M810 Age-related osteoporosis without current pathological fracture: Secondary | ICD-10-CM | POA: Diagnosis not present

## 2018-11-27 DIAGNOSIS — R54 Age-related physical debility: Secondary | ICD-10-CM | POA: Diagnosis not present

## 2018-11-27 DIAGNOSIS — Z9181 History of falling: Secondary | ICD-10-CM | POA: Diagnosis not present

## 2018-12-05 DIAGNOSIS — R05 Cough: Secondary | ICD-10-CM | POA: Diagnosis not present

## 2018-12-11 DIAGNOSIS — Z1159 Encounter for screening for other viral diseases: Secondary | ICD-10-CM | POA: Diagnosis not present

## 2018-12-19 DIAGNOSIS — F064 Anxiety disorder due to known physiological condition: Secondary | ICD-10-CM | POA: Diagnosis not present

## 2018-12-19 DIAGNOSIS — F331 Major depressive disorder, recurrent, moderate: Secondary | ICD-10-CM | POA: Diagnosis not present

## 2018-12-19 DIAGNOSIS — F0281 Dementia in other diseases classified elsewhere with behavioral disturbance: Secondary | ICD-10-CM | POA: Diagnosis not present

## 2018-12-19 DIAGNOSIS — G301 Alzheimer's disease with late onset: Secondary | ICD-10-CM | POA: Diagnosis not present

## 2018-12-19 DIAGNOSIS — F062 Psychotic disorder with delusions due to known physiological condition: Secondary | ICD-10-CM | POA: Diagnosis not present

## 2018-12-24 DIAGNOSIS — R55 Syncope and collapse: Secondary | ICD-10-CM | POA: Diagnosis not present

## 2018-12-24 DIAGNOSIS — R54 Age-related physical debility: Secondary | ICD-10-CM | POA: Diagnosis not present

## 2018-12-24 DIAGNOSIS — F0391 Unspecified dementia with behavioral disturbance: Secondary | ICD-10-CM | POA: Diagnosis not present

## 2018-12-24 DIAGNOSIS — G301 Alzheimer's disease with late onset: Secondary | ICD-10-CM | POA: Diagnosis not present

## 2018-12-24 DIAGNOSIS — F331 Major depressive disorder, recurrent, moderate: Secondary | ICD-10-CM | POA: Diagnosis not present

## 2018-12-24 DIAGNOSIS — M810 Age-related osteoporosis without current pathological fracture: Secondary | ICD-10-CM | POA: Diagnosis not present

## 2018-12-24 DIAGNOSIS — R634 Abnormal weight loss: Secondary | ICD-10-CM | POA: Diagnosis not present

## 2018-12-24 DIAGNOSIS — F419 Anxiety disorder, unspecified: Secondary | ICD-10-CM | POA: Diagnosis not present

## 2018-12-24 DIAGNOSIS — F062 Psychotic disorder with delusions due to known physiological condition: Secondary | ICD-10-CM | POA: Diagnosis not present

## 2018-12-24 DIAGNOSIS — M6281 Muscle weakness (generalized): Secondary | ICD-10-CM | POA: Diagnosis not present

## 2018-12-24 DIAGNOSIS — F0281 Dementia in other diseases classified elsewhere with behavioral disturbance: Secondary | ICD-10-CM | POA: Diagnosis not present

## 2018-12-24 DIAGNOSIS — F064 Anxiety disorder due to known physiological condition: Secondary | ICD-10-CM | POA: Diagnosis not present

## 2019-01-09 DIAGNOSIS — Z03818 Encounter for observation for suspected exposure to other biological agents ruled out: Secondary | ICD-10-CM | POA: Diagnosis not present

## 2019-01-22 DIAGNOSIS — J984 Other disorders of lung: Secondary | ICD-10-CM | POA: Diagnosis not present

## 2019-01-23 DIAGNOSIS — F419 Anxiety disorder, unspecified: Secondary | ICD-10-CM | POA: Diagnosis not present

## 2019-01-23 DIAGNOSIS — G301 Alzheimer's disease with late onset: Secondary | ICD-10-CM | POA: Diagnosis not present

## 2019-01-23 DIAGNOSIS — F331 Major depressive disorder, recurrent, moderate: Secondary | ICD-10-CM | POA: Diagnosis not present

## 2019-01-23 DIAGNOSIS — F062 Psychotic disorder with delusions due to known physiological condition: Secondary | ICD-10-CM | POA: Diagnosis not present

## 2019-01-23 DIAGNOSIS — E039 Hypothyroidism, unspecified: Secondary | ICD-10-CM | POA: Diagnosis not present

## 2019-01-23 DIAGNOSIS — F064 Anxiety disorder due to known physiological condition: Secondary | ICD-10-CM | POA: Diagnosis not present

## 2019-01-23 DIAGNOSIS — M6281 Muscle weakness (generalized): Secondary | ICD-10-CM | POA: Diagnosis not present

## 2019-01-23 DIAGNOSIS — M810 Age-related osteoporosis without current pathological fracture: Secondary | ICD-10-CM | POA: Diagnosis not present

## 2019-01-23 DIAGNOSIS — N184 Chronic kidney disease, stage 4 (severe): Secondary | ICD-10-CM | POA: Diagnosis not present

## 2019-01-23 DIAGNOSIS — R634 Abnormal weight loss: Secondary | ICD-10-CM | POA: Diagnosis not present

## 2019-01-23 DIAGNOSIS — R54 Age-related physical debility: Secondary | ICD-10-CM | POA: Diagnosis not present

## 2019-01-23 DIAGNOSIS — R05 Cough: Secondary | ICD-10-CM | POA: Diagnosis not present

## 2019-01-23 DIAGNOSIS — R55 Syncope and collapse: Secondary | ICD-10-CM | POA: Diagnosis not present

## 2019-01-24 DIAGNOSIS — F331 Major depressive disorder, recurrent, moderate: Secondary | ICD-10-CM | POA: Diagnosis not present

## 2019-01-24 DIAGNOSIS — F0281 Dementia in other diseases classified elsewhere with behavioral disturbance: Secondary | ICD-10-CM | POA: Diagnosis not present

## 2019-01-24 DIAGNOSIS — F064 Anxiety disorder due to known physiological condition: Secondary | ICD-10-CM | POA: Diagnosis not present

## 2019-01-24 DIAGNOSIS — G301 Alzheimer's disease with late onset: Secondary | ICD-10-CM | POA: Diagnosis not present

## 2019-01-24 DIAGNOSIS — F062 Psychotic disorder with delusions due to known physiological condition: Secondary | ICD-10-CM | POA: Diagnosis not present

## 2019-01-29 DIAGNOSIS — Z03818 Encounter for observation for suspected exposure to other biological agents ruled out: Secondary | ICD-10-CM | POA: Diagnosis not present

## 2019-01-31 DIAGNOSIS — M810 Age-related osteoporosis without current pathological fracture: Secondary | ICD-10-CM | POA: Diagnosis not present

## 2019-01-31 DIAGNOSIS — R54 Age-related physical debility: Secondary | ICD-10-CM | POA: Diagnosis not present

## 2019-01-31 DIAGNOSIS — R296 Repeated falls: Secondary | ICD-10-CM | POA: Diagnosis not present

## 2019-01-31 DIAGNOSIS — K5900 Constipation, unspecified: Secondary | ICD-10-CM | POA: Diagnosis not present

## 2019-01-31 DIAGNOSIS — R05 Cough: Secondary | ICD-10-CM | POA: Diagnosis not present

## 2019-01-31 DIAGNOSIS — N184 Chronic kidney disease, stage 4 (severe): Secondary | ICD-10-CM | POA: Diagnosis not present

## 2019-02-13 DIAGNOSIS — R05 Cough: Secondary | ICD-10-CM | POA: Diagnosis not present

## 2019-02-19 DIAGNOSIS — Z1159 Encounter for screening for other viral diseases: Secondary | ICD-10-CM | POA: Diagnosis not present

## 2019-02-26 DIAGNOSIS — Z1159 Encounter for screening for other viral diseases: Secondary | ICD-10-CM | POA: Diagnosis not present

## 2019-03-01 DIAGNOSIS — E559 Vitamin D deficiency, unspecified: Secondary | ICD-10-CM | POA: Diagnosis not present

## 2019-03-01 DIAGNOSIS — M6281 Muscle weakness (generalized): Secondary | ICD-10-CM | POA: Diagnosis not present

## 2019-03-01 DIAGNOSIS — R54 Age-related physical debility: Secondary | ICD-10-CM | POA: Diagnosis not present

## 2019-03-01 DIAGNOSIS — E039 Hypothyroidism, unspecified: Secondary | ICD-10-CM | POA: Diagnosis not present

## 2019-03-01 DIAGNOSIS — R296 Repeated falls: Secondary | ICD-10-CM | POA: Diagnosis not present

## 2019-03-01 DIAGNOSIS — R634 Abnormal weight loss: Secondary | ICD-10-CM | POA: Diagnosis not present

## 2019-03-01 DIAGNOSIS — I10 Essential (primary) hypertension: Secondary | ICD-10-CM | POA: Diagnosis not present

## 2019-03-01 DIAGNOSIS — M810 Age-related osteoporosis without current pathological fracture: Secondary | ICD-10-CM | POA: Diagnosis not present

## 2019-03-01 DIAGNOSIS — K5900 Constipation, unspecified: Secondary | ICD-10-CM | POA: Diagnosis not present

## 2019-03-01 DIAGNOSIS — F0391 Unspecified dementia with behavioral disturbance: Secondary | ICD-10-CM | POA: Diagnosis not present

## 2019-03-01 DIAGNOSIS — Z9181 History of falling: Secondary | ICD-10-CM | POA: Diagnosis not present

## 2019-03-01 DIAGNOSIS — N184 Chronic kidney disease, stage 4 (severe): Secondary | ICD-10-CM | POA: Diagnosis not present

## 2019-03-02 IMAGING — CR DG CHEST 2V
3 series · 3 of 3 positions shown · non-contrast
Comparison: Chest x-ray of September 17, 2017

CLINICAL DATA: Syncopal episode today. Amnesia to the event.
History of dementia.

EXAM:
CHEST - 2 VIEW

[chest lat (1 of 2)]
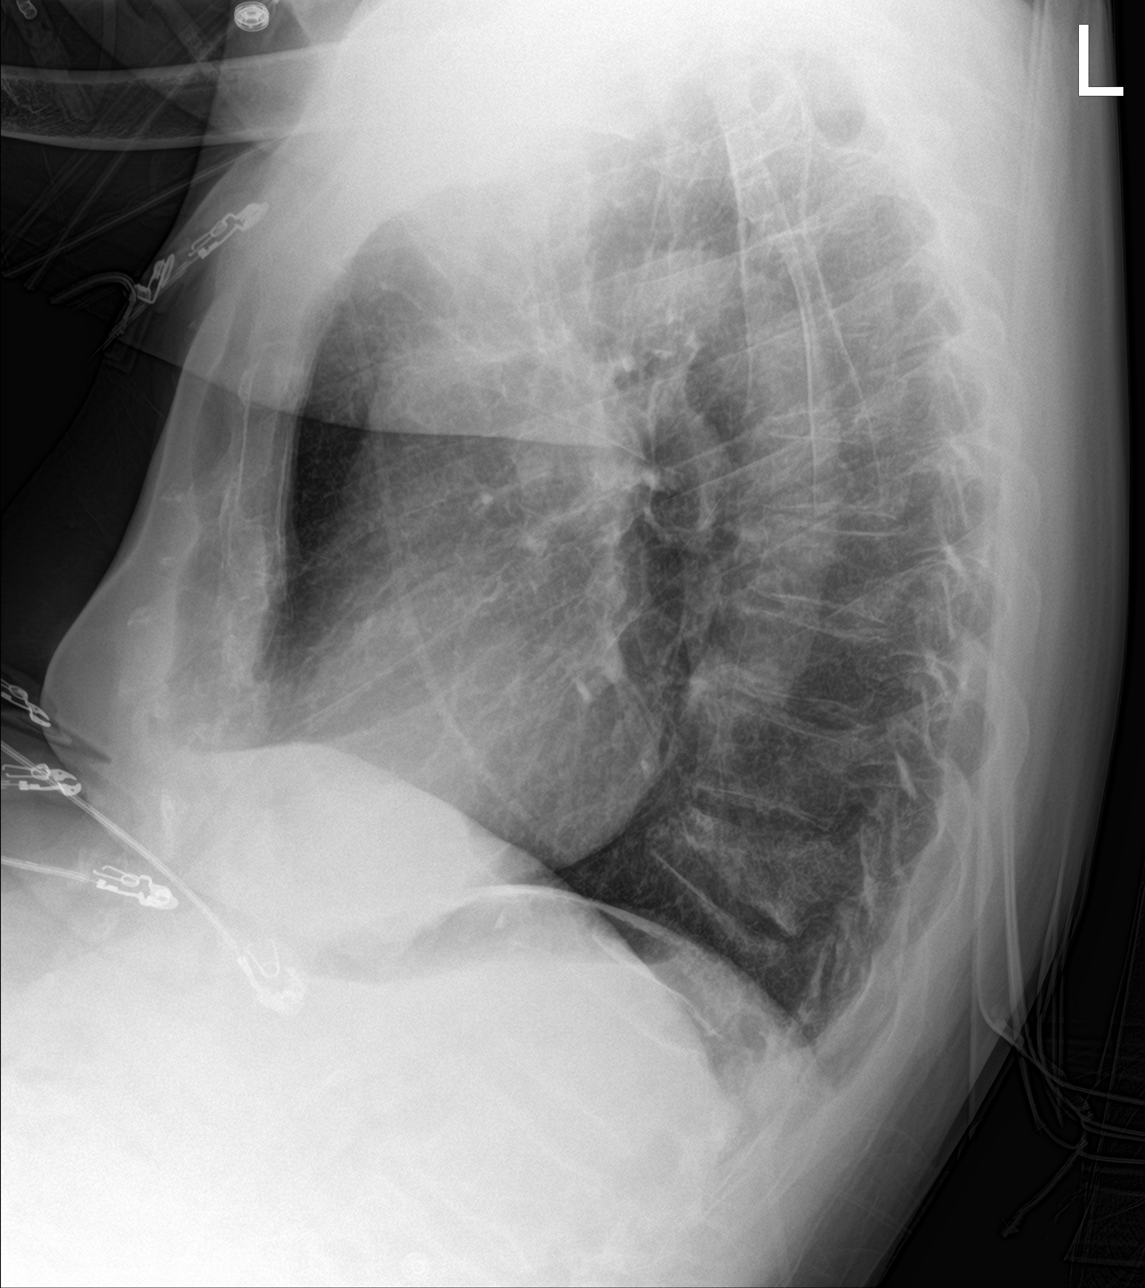

[chest ap]
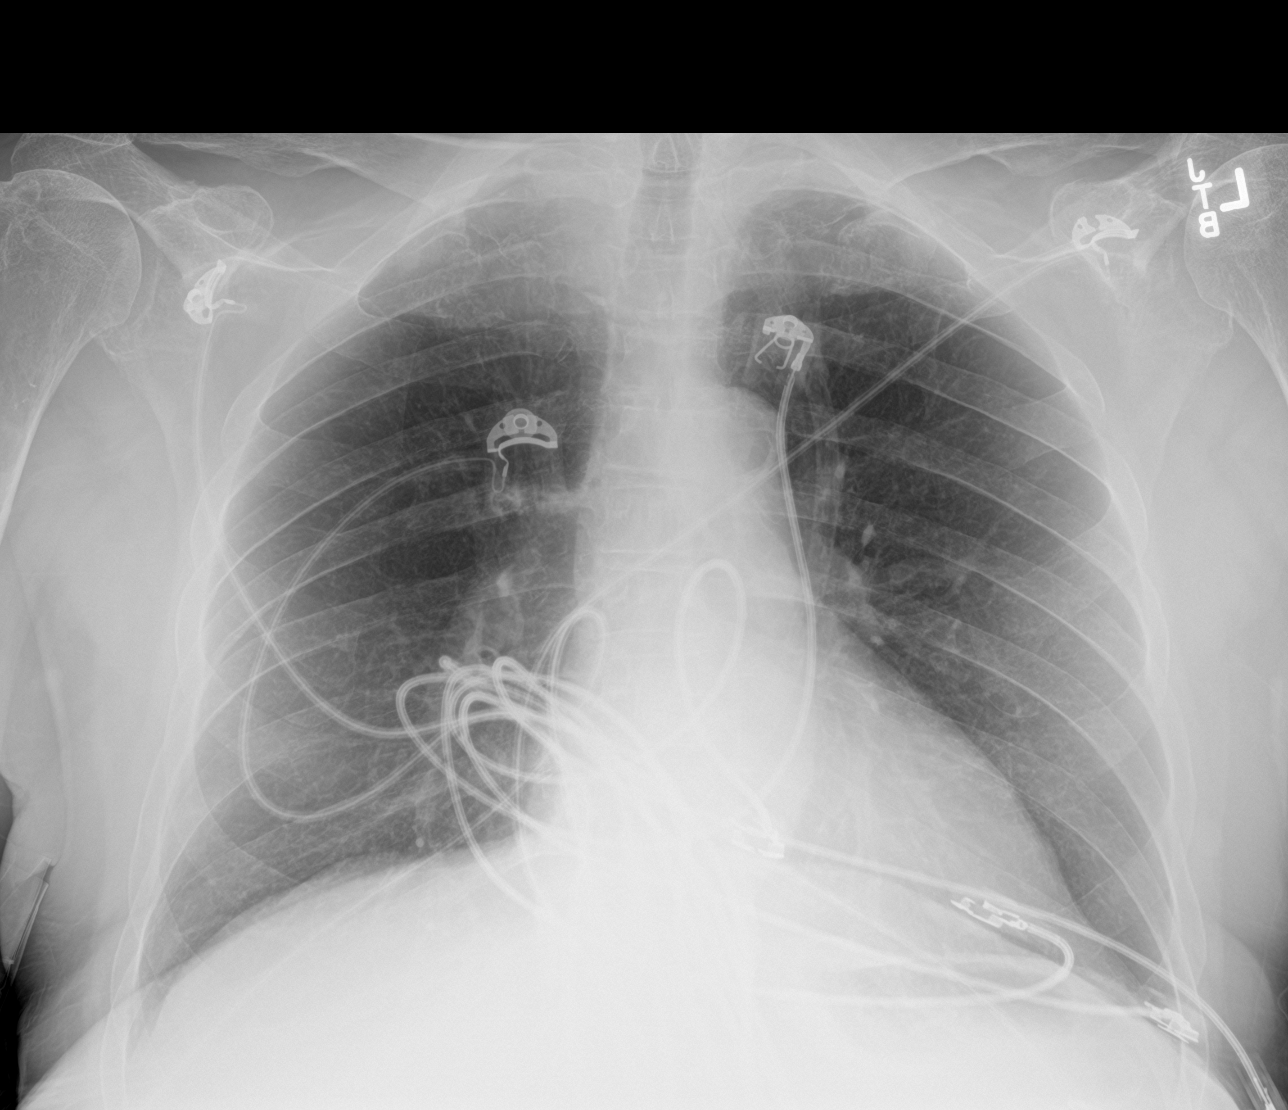

[chest lat (2 of 2)]
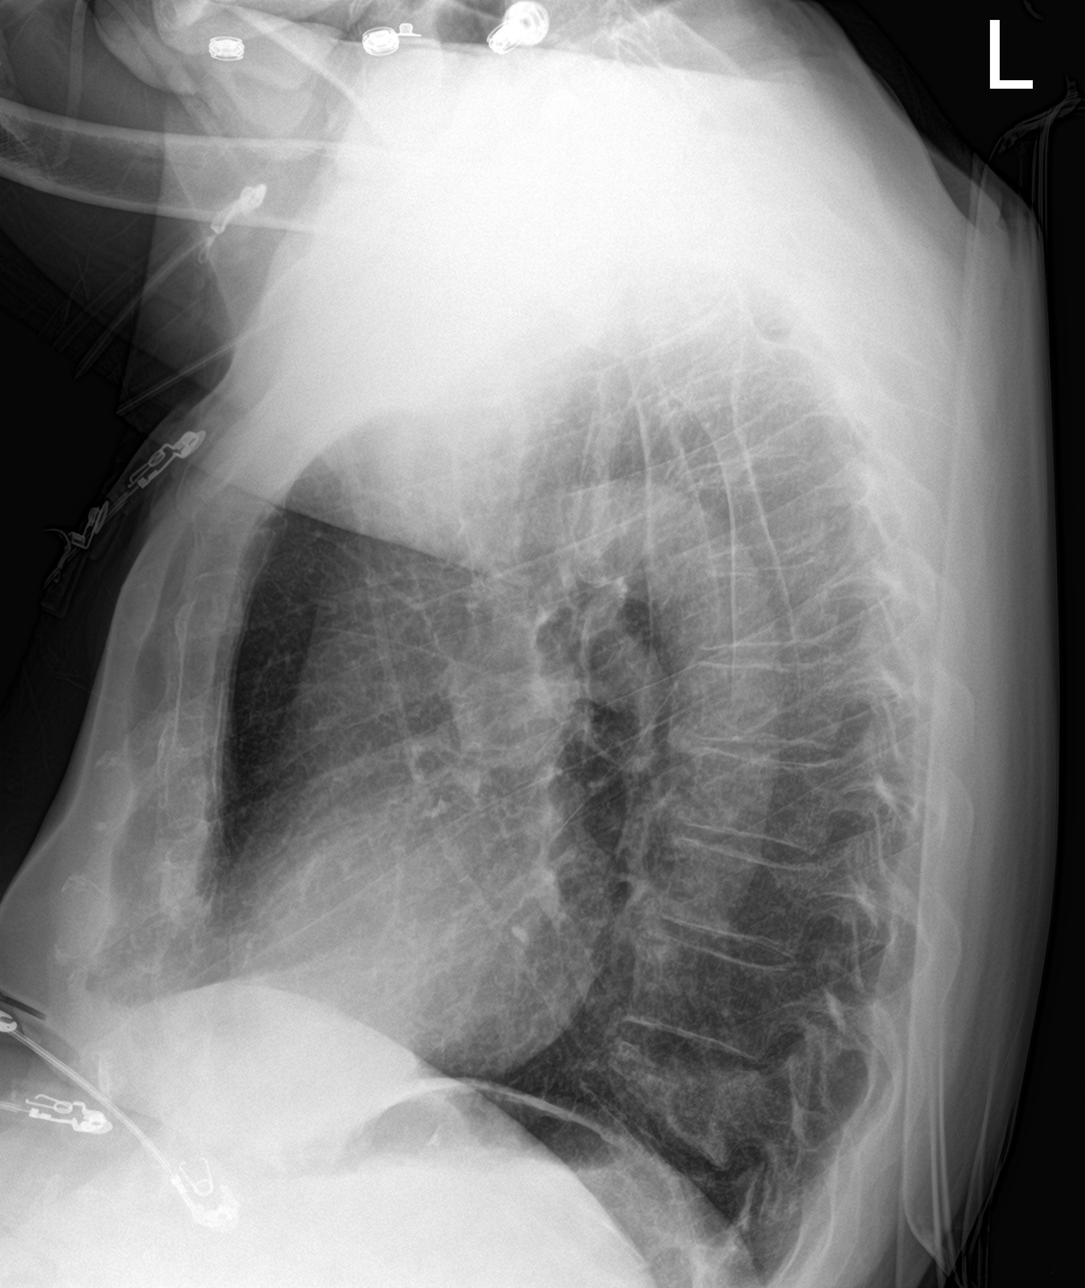

[3 of 3 positions shown; findings below may reference images not displayed]

FINDINGS: The lungs are well-expanded and clear. The heart and pulmonary
vascularity are normal. The mediastinum is normal in width. There is
calcification in the wall of the aortic arch. There is stable
partial compression of the body of T11.
IMPRESSION: There is no acute cardiopulmonary abnormality. Probable chronic
bronchitic changes, stable.

Thoracic aortic atherosclerosis.

## 2019-03-06 DIAGNOSIS — F064 Anxiety disorder due to known physiological condition: Secondary | ICD-10-CM | POA: Diagnosis not present

## 2019-03-06 DIAGNOSIS — G301 Alzheimer's disease with late onset: Secondary | ICD-10-CM | POA: Diagnosis not present

## 2019-03-06 DIAGNOSIS — F331 Major depressive disorder, recurrent, moderate: Secondary | ICD-10-CM | POA: Diagnosis not present

## 2019-03-06 DIAGNOSIS — F062 Psychotic disorder with delusions due to known physiological condition: Secondary | ICD-10-CM | POA: Diagnosis not present

## 2019-03-06 DIAGNOSIS — F0281 Dementia in other diseases classified elsewhere with behavioral disturbance: Secondary | ICD-10-CM | POA: Diagnosis not present

## 2019-03-07 DIAGNOSIS — E039 Hypothyroidism, unspecified: Secondary | ICD-10-CM | POA: Diagnosis not present

## 2019-03-07 DIAGNOSIS — I10 Essential (primary) hypertension: Secondary | ICD-10-CM | POA: Diagnosis not present

## 2019-03-07 DIAGNOSIS — F0391 Unspecified dementia with behavioral disturbance: Secondary | ICD-10-CM | POA: Diagnosis not present

## 2019-03-19 DIAGNOSIS — Z1159 Encounter for screening for other viral diseases: Secondary | ICD-10-CM | POA: Diagnosis not present

## 2019-03-25 ENCOUNTER — Other Ambulatory Visit: Payer: Self-pay

## 2019-03-25 ENCOUNTER — Inpatient Hospital Stay (HOSPITAL_COMMUNITY)
Admission: EM | Admit: 2019-03-25 | Discharge: 2019-03-31 | DRG: 871 | Disposition: A | Discharge status: Skilled Nursing Facility | Payer: Medicare Other | Source: Skilled Nursing Facility | Attending: Internal Medicine | Admitting: Internal Medicine

## 2019-03-25 ENCOUNTER — Emergency Department (HOSPITAL_COMMUNITY): Payer: Medicare Other

## 2019-03-25 DIAGNOSIS — Z9089 Acquired absence of other organs: Secondary | ICD-10-CM

## 2019-03-25 DIAGNOSIS — Z87891 Personal history of nicotine dependence: Secondary | ICD-10-CM | POA: Diagnosis not present

## 2019-03-25 DIAGNOSIS — I10 Essential (primary) hypertension: Secondary | ICD-10-CM | POA: Diagnosis not present

## 2019-03-25 DIAGNOSIS — E860 Dehydration: Secondary | ICD-10-CM | POA: Diagnosis present

## 2019-03-25 DIAGNOSIS — J9601 Acute respiratory failure with hypoxia: Secondary | ICD-10-CM | POA: Diagnosis present

## 2019-03-25 DIAGNOSIS — R410 Disorientation, unspecified: Secondary | ICD-10-CM | POA: Diagnosis not present

## 2019-03-25 DIAGNOSIS — N179 Acute kidney failure, unspecified: Secondary | ICD-10-CM | POA: Diagnosis present

## 2019-03-25 DIAGNOSIS — D539 Nutritional anemia, unspecified: Secondary | ICD-10-CM | POA: Diagnosis present

## 2019-03-25 DIAGNOSIS — Z79890 Hormone replacement therapy (postmenopausal): Secondary | ICD-10-CM

## 2019-03-25 DIAGNOSIS — Z743 Need for continuous supervision: Secondary | ICD-10-CM | POA: Diagnosis not present

## 2019-03-25 DIAGNOSIS — Z806 Family history of leukemia: Secondary | ICD-10-CM

## 2019-03-25 DIAGNOSIS — F0281 Dementia in other diseases classified elsewhere with behavioral disturbance: Secondary | ICD-10-CM | POA: Diagnosis present

## 2019-03-25 DIAGNOSIS — Z800 Family history of malignant neoplasm of digestive organs: Secondary | ICD-10-CM

## 2019-03-25 DIAGNOSIS — J1282 Pneumonia due to coronavirus disease 2019: Secondary | ICD-10-CM | POA: Diagnosis present

## 2019-03-25 DIAGNOSIS — R197 Diarrhea, unspecified: Secondary | ICD-10-CM | POA: Diagnosis present

## 2019-03-25 DIAGNOSIS — Z7401 Bed confinement status: Secondary | ICD-10-CM | POA: Diagnosis not present

## 2019-03-25 DIAGNOSIS — Z7983 Long term (current) use of bisphosphonates: Secondary | ICD-10-CM

## 2019-03-25 DIAGNOSIS — J1289 Other viral pneumonia: Secondary | ICD-10-CM | POA: Diagnosis not present

## 2019-03-25 DIAGNOSIS — A4189 Other specified sepsis: Secondary | ICD-10-CM | POA: Diagnosis present

## 2019-03-25 DIAGNOSIS — R279 Unspecified lack of coordination: Secondary | ICD-10-CM | POA: Diagnosis not present

## 2019-03-25 DIAGNOSIS — N1831 Chronic kidney disease, stage 3a: Secondary | ICD-10-CM | POA: Diagnosis present

## 2019-03-25 DIAGNOSIS — M2550 Pain in unspecified joint: Secondary | ICD-10-CM | POA: Diagnosis not present

## 2019-03-25 DIAGNOSIS — G9340 Encephalopathy, unspecified: Secondary | ICD-10-CM | POA: Diagnosis not present

## 2019-03-25 DIAGNOSIS — E876 Hypokalemia: Secondary | ICD-10-CM | POA: Diagnosis not present

## 2019-03-25 DIAGNOSIS — D72829 Elevated white blood cell count, unspecified: Secondary | ICD-10-CM | POA: Diagnosis not present

## 2019-03-25 DIAGNOSIS — A0472 Enterocolitis due to Clostridium difficile, not specified as recurrent: Secondary | ICD-10-CM | POA: Diagnosis present

## 2019-03-25 DIAGNOSIS — I129 Hypertensive chronic kidney disease with stage 1 through stage 4 chronic kidney disease, or unspecified chronic kidney disease: Secondary | ICD-10-CM | POA: Diagnosis present

## 2019-03-25 DIAGNOSIS — G309 Alzheimer's disease, unspecified: Secondary | ICD-10-CM | POA: Diagnosis present

## 2019-03-25 DIAGNOSIS — I959 Hypotension, unspecified: Secondary | ICD-10-CM | POA: Diagnosis not present

## 2019-03-25 DIAGNOSIS — E039 Hypothyroidism, unspecified: Secondary | ICD-10-CM | POA: Diagnosis present

## 2019-03-25 DIAGNOSIS — I491 Atrial premature depolarization: Secondary | ICD-10-CM | POA: Diagnosis present

## 2019-03-25 DIAGNOSIS — R319 Hematuria, unspecified: Secondary | ICD-10-CM | POA: Diagnosis present

## 2019-03-25 DIAGNOSIS — Z1159 Encounter for screening for other viral diseases: Secondary | ICD-10-CM | POA: Diagnosis not present

## 2019-03-25 DIAGNOSIS — D649 Anemia, unspecified: Secondary | ICD-10-CM | POA: Diagnosis not present

## 2019-03-25 DIAGNOSIS — R935 Abnormal findings on diagnostic imaging of other abdominal regions, including retroperitoneum: Secondary | ICD-10-CM | POA: Diagnosis present

## 2019-03-25 DIAGNOSIS — F1021 Alcohol dependence, in remission: Secondary | ICD-10-CM | POA: Diagnosis present

## 2019-03-25 DIAGNOSIS — Z66 Do not resuscitate: Secondary | ICD-10-CM | POA: Diagnosis present

## 2019-03-25 DIAGNOSIS — U071 COVID-19: Secondary | ICD-10-CM | POA: Diagnosis present

## 2019-03-25 DIAGNOSIS — G9341 Metabolic encephalopathy: Secondary | ICD-10-CM | POA: Diagnosis present

## 2019-03-25 DIAGNOSIS — A09 Infectious gastroenteritis and colitis, unspecified: Secondary | ICD-10-CM | POA: Diagnosis not present

## 2019-03-25 DIAGNOSIS — Z79899 Other long term (current) drug therapy: Secondary | ICD-10-CM

## 2019-03-25 DIAGNOSIS — R5381 Other malaise: Secondary | ICD-10-CM | POA: Diagnosis not present

## 2019-03-25 DIAGNOSIS — E785 Hyperlipidemia, unspecified: Secondary | ICD-10-CM | POA: Diagnosis present

## 2019-03-25 DIAGNOSIS — F329 Major depressive disorder, single episode, unspecified: Secondary | ICD-10-CM | POA: Diagnosis present

## 2019-03-25 DIAGNOSIS — G301 Alzheimer's disease with late onset: Secondary | ICD-10-CM | POA: Diagnosis not present

## 2019-03-25 LAB — CBC WITH DIFFERENTIAL/PLATELET
Abs Immature Granulocytes: 0.22 10*3/uL — ABNORMAL HIGH (ref 0.00–0.07)
Basophils Absolute: 0 10*3/uL (ref 0.0–0.1)
Basophils Relative: 0 %
Eosinophils Absolute: 0.1 10*3/uL (ref 0.0–0.5)
Eosinophils Relative: 0 %
HCT: 40.4 % (ref 39.0–52.0)
Hemoglobin: 13.4 g/dL (ref 13.0–17.0)
Immature Granulocytes: 1 %
Lymphocytes Relative: 9 %
Lymphs Abs: 1.8 10*3/uL (ref 0.7–4.0)
MCH: 38.3 pg — ABNORMAL HIGH (ref 26.0–34.0)
MCHC: 33.2 g/dL (ref 30.0–36.0)
MCV: 115.4 fL — ABNORMAL HIGH (ref 80.0–100.0)
Monocytes Absolute: 6.1 10*3/uL — ABNORMAL HIGH (ref 0.1–1.0)
Monocytes Relative: 32 %
Neutro Abs: 11.1 10*3/uL — ABNORMAL HIGH (ref 1.7–7.7)
Neutrophils Relative %: 58 %
Platelets: 151 10*3/uL (ref 150–400)
RBC: 3.5 MIL/uL — ABNORMAL LOW (ref 4.22–5.81)
RDW: 14.7 % (ref 11.5–15.5)
WBC: 19.3 10*3/uL — ABNORMAL HIGH (ref 4.0–10.5)
nRBC: 0 % (ref 0.0–0.2)

## 2019-03-25 LAB — PROTIME-INR
INR: 1.1 (ref 0.8–1.2)
Prothrombin Time: 14 seconds (ref 11.4–15.2)

## 2019-03-25 LAB — COMPREHENSIVE METABOLIC PANEL
ALT: 34 U/L (ref 0–44)
AST: 80 U/L — ABNORMAL HIGH (ref 15–41)
Albumin: 3.2 g/dL — ABNORMAL LOW (ref 3.5–5.0)
Alkaline Phosphatase: 45 U/L (ref 38–126)
Anion gap: 14 (ref 5–15)
BUN: 31 mg/dL — ABNORMAL HIGH (ref 8–23)
CO2: 22 mmol/L (ref 22–32)
Calcium: 9.2 mg/dL (ref 8.9–10.3)
Chloride: 102 mmol/L (ref 98–111)
Creatinine, Ser: 2.6 mg/dL — ABNORMAL HIGH (ref 0.61–1.24)
GFR calc Af Amer: 24 mL/min — ABNORMAL LOW (ref >60)
GFR calc non Af Amer: 21 mL/min — ABNORMAL LOW (ref >60)
Glucose, Bld: 154 mg/dL — ABNORMAL HIGH (ref 70–99)
Potassium: 4.3 mmol/L (ref 3.5–5.1)
Sodium: 138 mmol/L (ref 135–145)
Total Bilirubin: 0.9 mg/dL (ref 0.3–1.2)
Total Protein: 6.7 g/dL (ref 6.5–8.1)

## 2019-03-25 LAB — URINALYSIS, ROUTINE W REFLEX MICROSCOPIC
Bacteria, UA: NONE SEEN
Bilirubin Urine: NEGATIVE
Glucose, UA: NEGATIVE mg/dL
Ketones, ur: 5 mg/dL — AB
Leukocytes,Ua: NEGATIVE
Nitrite: NEGATIVE
Protein, ur: 30 mg/dL — AB
RBC / HPF: >50 RBC/hpf — ABNORMAL HIGH (ref 0–5)
Specific Gravity, Urine: 1.018 (ref 1.005–1.030)
pH: 6 (ref 5.0–8.0)

## 2019-03-25 LAB — LACTIC ACID, PLASMA: Lactic Acid, Venous: 2.6 mmol/L (ref 0.5–1.9)

## 2019-03-25 LAB — CBG MONITORING, ED: Glucose-Capillary: 147 mg/dL — ABNORMAL HIGH (ref 70–99)

## 2019-03-25 LAB — C DIFFICILE QUICK SCREEN W PCR REFLEX
C Diff antigen: POSITIVE — AB
C Diff toxin: NEGATIVE

## 2019-03-25 LAB — APTT: aPTT: 40 seconds — ABNORMAL HIGH (ref 24–36)

## 2019-03-25 LAB — POC SARS CORONAVIRUS 2 AG -  ED: SARS Coronavirus 2 Ag: NEGATIVE

## 2019-03-25 MED ORDER — ACETAMINOPHEN 650 MG RE SUPP: 650.0000 mg | Freq: Four times a day (QID) | RECTAL | Status: DC | PRN

## 2019-03-25 MED ORDER — SODIUM CHLORIDE 0.9 % IV BOLUS
500.0000 mL | Freq: Once | INTRAVENOUS | Status: AC
  Administered 2019-03-25: 500 mL via INTRAVENOUS

## 2019-03-25 MED ORDER — SODIUM CHLORIDE 0.9 % IV SOLN
1.0000 g | INTRAVENOUS | Status: DC
  Administered 2019-03-25: 1 g via INTRAVENOUS
  Filled 2019-03-25: qty 10

## 2019-03-25 MED ORDER — HEPARIN SODIUM (PORCINE) 5000 UNIT/ML IJ SOLN
5000.0000 [IU] | Freq: Three times a day (TID) | INTRAMUSCULAR | Status: DC
  Administered 2019-03-26 – 2019-03-31 (×17): 5000 [IU] via SUBCUTANEOUS
  Filled 2019-03-25 (×17): qty 1

## 2019-03-25 MED ORDER — SODIUM CHLORIDE 0.9 % IV SOLN: INTRAVENOUS | Status: DC

## 2019-03-25 MED ORDER — ONDANSETRON HCL 4 MG PO TABS: 4.0000 mg | ORAL_TABLET | Freq: Four times a day (QID) | ORAL | Status: DC | PRN

## 2019-03-25 MED ORDER — ACETAMINOPHEN 325 MG PO TABS: 650.0000 mg | ORAL_TABLET | Freq: Four times a day (QID) | ORAL | Status: DC | PRN

## 2019-03-25 MED ORDER — ONDANSETRON HCL 4 MG/2ML IJ SOLN: 4.0000 mg | Freq: Four times a day (QID) | INTRAMUSCULAR | Status: DC | PRN

## 2019-03-25 NOTE — ED Provider Notes (Signed)
[facility] EMERGENCY DEPARTMENT Provider Note   CSN: [id number] Arrival date & time: 03/25/19  [id number]     History Chief Complaint  Patient presents with  . Transient Ischemic Attack    Spencer Howard is a 85 y.o. male.  Patient is an 85 year old male with a history of dementia associated with alcoholism, depression, hypertension who lives in an extended care facility presenting today with altered mental status. Staff noticed around 7 or 8:00 this morning patient did not seem himself but they felt like it improved. However this afternoon patient still seemed altered to them. He also was noted to have some loose stools today EMS reported a temperature of 100.7 and patient states he has not had much appetite. They initially stated that his oxygen was low and placed him on 3 L but EMS reported he was satting normally throughout transport. Patient denies any pain anywhere, shortness of breath or cough. However facility noted that they had 8 new cases of Covid within the last 2 days.  The history is provided by the EMS personnel, the patient and the nursing home.       Past Medical History:  Diagnosis Date  . Alcohol abuse with alcohol-induced mental disorder ([place])   . Dementia associated with alcoholism ([place])    Alzheimer's dementia versus alcohol related dementia. 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 Depression   . High cholesterol   . Hypertension   . Hypothyroidism (acquired)     Patient Active Problem List   Diagnosis Date Noted  . Palliative care encounter   . Fall   . Unresponsive episode   . Altered mental status 09/17/2017  . Anemia 09/17/2017  . Dementia, Alzheimer's, with behavior disturbance ([place]) 05/29/2017  . Orthostatic hypotension 11/04/2016  . Closed left hand fracture 11/02/2016  . Dementia ([place]) 11/02/2016  . Syncope 10/31/2016  . DOE (dyspnea on exertion) 03/05/2016  . Abnormal fourth heart sound (S4)  03/05/2016  . Essential hypertension 02/01/2015  . HLD (hyperlipidemia) 02/01/2015  . Depressed 02/01/2015  . Hypothyroidism 02/01/2015    Past Surgical History:  Procedure Laterality Date  . ELBOW SURGERY Left 1995   Broken  . NM MYOVIEW LTD  02/2016   Normal LV function - EF 60-65%. No EKG changes. No ischemia or infarction noted on imaging. LOW RISK  . TONSILLECTOMY  1942  . TRANSTHORACIC ECHOCARDIOGRAM  02/2016   Moderate concentric LVH with EF 55-60%. GR 1 DD. Mild LA dilation. Mildly increased PA pressures  . VARICOCELECTOMY  1967       Family History  Problem Relation Age of Onset  . Leukemia Father   . Colon cancer Mother   . Dementia Neg Hx     Social History   Tobacco Use  . Smoking status: Former Smoker    Quit date: 03/28/1969    Years since quitting: 50.0  . Smokeless tobacco: Never Used  Substance Use Topics  . Alcohol use: No    Alcohol/week: 0.0 standard drinks    Comment: Stopped August 2018  . Drug use: No    Home Medications Prior to Admission medications   Medication Sig Start Date End Date Taking? Authorizing Provider  acetaminophen (TYLENOL) 325 MG tablet Take 650 mg by mouth every 6 (six) hours as needed.    [provider]  alendronate (FOSAMAX) 70 MG tablet Take 70 mg by mouth every Monday.  05/18/15   [provider]  Calcium Citrate-Vitamin D (CALCIUM CITRATE +  D3 PO) Take 1 tablet by mouth daily.    [provider]  Cholecalciferol (VITAMIN D) 2000 units tablet Take 2,000 Units by mouth daily.    [provider]  cloNIDine (CATAPRES) 0.1 MG tablet 0.1 mg. Every 8 hours as needed 11/29/17   [provider]  donepezil (ARICEPT) 10 MG tablet TAKE 1 TABLET TWICE A DAY 07/06/17   Melvenia Beam, MD  levothyroxine (SYNTHROID, LEVOTHROID) 75 MCG tablet Take 1 tablet (75 mcg total) by mouth daily before breakfast. 09/20/17   Geradine Girt, DO  lisinopril (PRINIVIL,ZESTRIL) 2.5 MG tablet 2.5 mg daily.  11/28/17   [provider]  LORazepam (ATIVAN) 0.5 MG tablet 0.5 mg at bedtime. As needed 10/26/17   [provider]  memantine (NAMENDA) 5 MG tablet Take 1 tablet (5 mg total) by mouth 2 (two) times daily. 05/29/17   Melvenia Beam, MD  midodrine (PROAMATINE) 10 MG tablet Take 1 tablet (10 mg total) by mouth 3 (three) times daily. Patient not taking: Reported on 12/04/2017 12/21/16   Melvenia Beam, MD  Multiple Vitamins-Minerals (CENTRUM SILVER 50+MEN) TABS Take 1 tablet by mouth daily with breakfast.    [provider]  PARoxetine (PAXIL) 10 MG tablet Take 10 mg by mouth 2 (two) times daily.  06/27/17   [provider]    Allergies    Patient has no known allergies.  Review of Systems   Review of Systems  All other systems reviewed and are negative.   Physical Exam Updated Vital Signs BP 129/68   Pulse 78   Temp 98 F (36.7 C) (Oral)   Resp 17   SpO2 100%   Physical Exam Vitals and nursing note reviewed.  Constitutional:      General: He is not in acute distress.    Appearance: He is well-developed and normal weight.  HENT:     Head: Normocephalic and atraumatic.     Mouth/Throat:     Mouth: Mucous membranes are dry.  Eyes:     Conjunctiva/sclera: Conjunctivae normal.     Pupils: Pupils are equal, round, and reactive to light.  Cardiovascular:     Rate and Rhythm: Normal rate and regular rhythm.     Heart sounds: No murmur.  Pulmonary:     Effort: Pulmonary effort is normal. No respiratory distress.     Breath sounds: Normal breath sounds. No wheezing or rales.  Abdominal:     General: Abdomen is flat. Bowel sounds are normal. There is no distension.     Palpations: Abdomen is soft.     Tenderness: There is no abdominal tenderness. There is no guarding or rebound.  Musculoskeletal:        General: No tenderness. Normal range of motion.     Cervical back: Normal range of motion and neck supple.     Right lower leg: No edema.     Left  lower leg: No edema.  Skin:    General: Skin is warm and dry.     Findings: No erythema or rash.  Neurological:     Mental Status: He is alert. Mental status is at baseline.     Comments: Oriented to person and place. Can move all extremities and has no facial droop or sensory changes  Psychiatric:     Comments: Calm and cooperative. Awake and alert on exam     ED Results / Procedures / Treatments   Labs (all labs ordered are listed, but only abnormal results  are displayed) Labs Reviewed  LACTIC ACID, PLASMA - Abnormal; Notable for the following components:      Result Value   Lactic Acid, Venous 2.6 (*)    All other components within normal limits  COMPREHENSIVE METABOLIC PANEL - Abnormal; Notable for the following components:   Glucose, Bld 154 (*)    BUN 31 (*)    Creatinine, Ser 2.60 (*)    Albumin 3.2 (*)    AST 80 (*)    GFR calc non Af Amer 21 (*)    GFR calc Af Amer 24 (*)    All other components within normal limits  CBC WITH DIFFERENTIAL/PLATELET - Abnormal; Notable for the following components:   WBC 19.3 (*)    RBC 3.50 (*)    MCV 115.4 (*)    MCH 38.3 (*)    Neutro Abs 11.1 (*)    Monocytes Absolute 6.1 (*)    Abs Immature Granulocytes 0.22 (*)    All other components within normal limits  APTT - Abnormal; Notable for the following components:   aPTT 40 (*)    All other components within normal limits  URINALYSIS, ROUTINE W REFLEX MICROSCOPIC - Abnormal; Notable for the following components:   Color, Urine AMBER (*)    APPearance HAZY (*)    Hgb urine dipstick LARGE (*)    Ketones, ur 5 (*)    Protein, ur 30 (*)    RBC / HPF >50 (*)    All other components within normal limits  CBG MONITORING, ED - Abnormal; Notable for the following components:   Glucose-Capillary 147 (*)    All other components within normal limits  CULTURE, BLOOD (ROUTINE X 2)  CULTURE, BLOOD (ROUTINE X 2)  URINE CULTURE  SARS CORONAVIRUS 2 (TAT 6-24 HRS)  C DIFFICILE QUICK  SCREEN W PCR REFLEX  PROTIME-INR  LACTIC ACID, PLASMA  POC SARS CORONAVIRUS 2 AG -  ED    EKG EKG Interpretation  Date/Time:  Monday March 25 2019 18:22:37 EST Ventricular Rate:  82 PR Interval:    QRS Duration: 86 QT Interval:  366 QTC Calculation: 428 R Axis:   -36 Text Interpretation: Sinus rhythm Atrial premature complex Left axis deviation No significant change since last tracing Confirmed by Blanchie Dessert [id number]) on 03/25/2019 6:46:47 PM   Radiology DG Chest Port 1 View  Result Date: 03/25/2019 CLINICAL DATA:  Fever EXAM: PORTABLE CHEST 1 VIEW COMPARISON:  07/13/2017 FINDINGS: No focal airspace disease or effusion. Atelectasis or scar at the left base. Normal heart size. Aortic atherosclerosis. No pneumothorax. Non inclusion of the right CP angle. IMPRESSION: No active disease.  Scarring or atelectasis at the left base. Electronically Signed   By: Donavan Foil M.D.   On: 03/25/2019 19:49    Procedures Procedures (including critical care time)  Medications Ordered in ED Medications - No data to display  ED Course  I have reviewed the triage vital signs and the nursing notes.  Pertinent labs & imaging results that were available during my care of the patient were reviewed by me and considered in my medical decision making (see chart for details).    MDM Rules/Calculators/A&P                      Elderly male who is currently living at a long-term care facility presenting today with altered mental status, loose stools, fever and recent Covid positive residents at the same facility. Patient is awake and alert here he  is oriented to person and place but not time. Based on report this seems to be baseline. He is moving all extremities and has no evidence of strokelike symptoms at this time. Patient was placed on oxygen at the facility however he is satting 100% here and will wean oxygen to see if he is hypoxic. He has no evidence of cellulitis, abdominal pain or  abnormal breath sounds. Concern for possible Covid or UTI or other infectious etiology.  Pt currently not displaying signs of stroke as he is moving all ext and has no sensory deficits and is awake/alert and able to answer most questions.  Will Ct head and code sepsis order set and re-evaluated.   10:07 PM Patient's rectal temperature was 99.9.  Significant lab findings with a white count of 19,000, new AKI with a creatinine of 2.6 from his baseline of less than 1.  Chest x-ray is clear without acute findings, rapid Covid is negative and PCR is pending and UA without signs of UTI.  Given patient's new leukocytosis, AKI and low-grade fever he was initially covered with Rocephin prior to urine resulting.  However concern patient may have C. difficile and a C. difficile PCR was also ordered given his numerous loose stools today.  Patient was given a bolus of IV fluid for his lactate of 2.6 and was placed on a rate.  He will need admission for further care.  Final Clinical Impression(s) / ED Diagnoses Final diagnoses:  AKI (acute kidney injury) ([place])  Diarrhea of presumed infectious origin    Rx / DC Orders ED Discharge Orders    None       Blanchie Dessert, MD 03/25/19 [id number]

## 2019-03-25 NOTE — ED Triage Notes (Signed)
Pt presents from [facility], called out for AMS related to episode of "stroke like sx" that began around 0700 and then resolved. Staff are poor historians per EMS, states that sx resolved but also state that pt is still altered and does not typically ask repetitive questions despite being in dementia unit of facility. Staff provided 3L O2 prior to EMS arrival to increase spO2 from 91% to 99%.  There have been 8 new cases of covid at [facility] per staff, concern that pt has been exposed.  EMS exam- repetitive questions, alert, EKG unremarkable, 20G L forearm, 100.75F, 112/60, 70bpm

## 2019-03-25 NOTE — ED Notes (Addendum)
One instance of sanguineous diarrhea. Pt changed and rectal temp taken.

## 2019-03-25 NOTE — ED Notes (Signed)
Pt reduced to 1L O2 to trial

## 2019-03-26 ENCOUNTER — Inpatient Hospital Stay (HOSPITAL_COMMUNITY): Payer: Medicare Other

## 2019-03-26 ENCOUNTER — Encounter (HOSPITAL_COMMUNITY): Payer: Self-pay | Admitting: Internal Medicine

## 2019-03-26 DIAGNOSIS — R197 Diarrhea, unspecified: Secondary | ICD-10-CM

## 2019-03-26 DIAGNOSIS — G301 Alzheimer's disease with late onset: Secondary | ICD-10-CM

## 2019-03-26 DIAGNOSIS — F0281 Dementia in other diseases classified elsewhere with behavioral disturbance: Secondary | ICD-10-CM

## 2019-03-26 DIAGNOSIS — N179 Acute kidney failure, unspecified: Secondary | ICD-10-CM

## 2019-03-26 DIAGNOSIS — I10 Essential (primary) hypertension: Secondary | ICD-10-CM

## 2019-03-26 DIAGNOSIS — R319 Hematuria, unspecified: Secondary | ICD-10-CM

## 2019-03-26 DIAGNOSIS — G9340 Encephalopathy, unspecified: Secondary | ICD-10-CM

## 2019-03-26 DIAGNOSIS — D72829 Elevated white blood cell count, unspecified: Secondary | ICD-10-CM

## 2019-03-26 LAB — COMPREHENSIVE METABOLIC PANEL
ALT: 35 U/L (ref 0–44)
AST: 88 U/L — ABNORMAL HIGH (ref 15–41)
Albumin: 2.9 g/dL — ABNORMAL LOW (ref 3.5–5.0)
Alkaline Phosphatase: 40 U/L (ref 38–126)
Anion gap: 12 (ref 5–15)
BUN: 39 mg/dL — ABNORMAL HIGH (ref 8–23)
CO2: 24 mmol/L (ref 22–32)
Calcium: 8.7 mg/dL — ABNORMAL LOW (ref 8.9–10.3)
Chloride: 105 mmol/L (ref 98–111)
Creatinine, Ser: 2.7 mg/dL — ABNORMAL HIGH (ref 0.61–1.24)
GFR calc Af Amer: 23 mL/min — ABNORMAL LOW (ref >60)
GFR calc non Af Amer: 20 mL/min — ABNORMAL LOW (ref >60)
Glucose, Bld: 120 mg/dL — ABNORMAL HIGH (ref 70–99)
Potassium: 4.4 mmol/L (ref 3.5–5.1)
Sodium: 141 mmol/L (ref 135–145)
Total Bilirubin: 0.4 mg/dL (ref 0.3–1.2)
Total Protein: 6.1 g/dL — ABNORMAL LOW (ref 6.5–8.1)

## 2019-03-26 LAB — CBC
HCT: 37.3 % — ABNORMAL LOW (ref 39.0–52.0)
Hemoglobin: 12.6 g/dL — ABNORMAL LOW (ref 13.0–17.0)
MCH: 38.3 pg — ABNORMAL HIGH (ref 26.0–34.0)
MCHC: 33.8 g/dL (ref 30.0–36.0)
MCV: 113.4 fL — ABNORMAL HIGH (ref 80.0–100.0)
Platelets: 151 10*3/uL (ref 150–400)
RBC: 3.29 MIL/uL — ABNORMAL LOW (ref 4.22–5.81)
RDW: 14.9 % (ref 11.5–15.5)
WBC: 30.3 10*3/uL — ABNORMAL HIGH (ref 4.0–10.5)
nRBC: 0 % (ref 0.0–0.2)

## 2019-03-26 LAB — SARS CORONAVIRUS 2 (TAT 6-24 HRS): SARS Coronavirus 2: POSITIVE — AB

## 2019-03-26 LAB — PROCALCITONIN: Procalcitonin: 31.79 ng/mL

## 2019-03-26 LAB — URINE CULTURE

## 2019-03-26 LAB — CLOSTRIDIUM DIFFICILE BY PCR, REFLEXED: Toxigenic C. Difficile by PCR: POSITIVE — AB

## 2019-03-26 LAB — LACTIC ACID, PLASMA
Lactic Acid, Venous: 2.6 mmol/L (ref 0.5–1.9)
Lactic Acid, Venous: 2.8 mmol/L (ref 0.5–1.9)

## 2019-03-26 LAB — C-REACTIVE PROTEIN: CRP: 11.1 mg/dL — ABNORMAL HIGH (ref <1.0)

## 2019-03-26 LAB — ABO/RH: ABO/RH(D): A POS

## 2019-03-26 LAB — D-DIMER, QUANTITATIVE: D-Dimer, Quant: 1.67 ug{FEU}/mL — ABNORMAL HIGH (ref 0.00–0.50)

## 2019-03-26 MED ORDER — LIP MEDEX EX OINT
1.0000 "application " | TOPICAL_OINTMENT | CUTANEOUS | Status: DC | PRN
  Filled 2019-03-26: qty 7

## 2019-03-26 MED ORDER — PAROXETINE HCL 10 MG PO TABS: 10.0000 mg | ORAL_TABLET | Freq: Two times a day (BID) | ORAL | Status: DC

## 2019-03-26 MED ORDER — SODIUM CHLORIDE 0.9 % IV SOLN
200.0000 mg | Freq: Once | INTRAVENOUS | Status: AC
  Administered 2019-03-26: 200 mg via INTRAVENOUS
  Filled 2019-03-26: qty 200

## 2019-03-26 MED ORDER — DONEPEZIL HCL 10 MG PO TABS
10.0000 mg | ORAL_TABLET | Freq: Every day | ORAL | Status: DC
  Administered 2019-03-26 – 2019-03-31 (×6): 10 mg via ORAL
  Filled 2019-03-26 (×6): qty 1

## 2019-03-26 MED ORDER — LEVOTHYROXINE SODIUM 75 MCG PO TABS
75.0000 ug | ORAL_TABLET | Freq: Every day | ORAL | Status: DC
  Administered 2019-03-27 – 2019-03-31 (×5): 75 ug via ORAL
  Filled 2019-03-26 (×5): qty 1

## 2019-03-26 MED ORDER — DONEPEZIL HCL 10 MG PO TABS: 10.0000 mg | ORAL_TABLET | Freq: Two times a day (BID) | ORAL | Status: DC

## 2019-03-26 MED ORDER — MEMANTINE HCL 5 MG PO TABS
5.0000 mg | ORAL_TABLET | Freq: Two times a day (BID) | ORAL | Status: DC
  Administered 2019-03-26 – 2019-03-31 (×10): 5 mg via ORAL
  Filled 2019-03-26 (×13): qty 1

## 2019-03-26 MED ORDER — SODIUM CHLORIDE 0.9 % IV SOLN
100.0000 mg | Freq: Every day | INTRAVENOUS | Status: AC
  Administered 2019-03-27 – 2019-03-30 (×4): 100 mg via INTRAVENOUS
  Filled 2019-03-26 (×4): qty 20

## 2019-03-26 MED ORDER — ADULT MULTIVITAMIN W/MINERALS CH
1.0000 | ORAL_TABLET | Freq: Every day | ORAL | Status: DC
  Administered 2019-03-27 – 2019-03-31 (×5): 1 via ORAL
  Filled 2019-03-26 (×8): qty 1

## 2019-03-26 MED ORDER — VANCOMYCIN 50 MG/ML ORAL SOLUTION
125.0000 mg | Freq: Four times a day (QID) | ORAL | Status: DC
  Administered 2019-03-26 – 2019-03-31 (×20): 125 mg via ORAL
  Filled 2019-03-26 (×27): qty 2.5

## 2019-03-26 NOTE — ED Notes (Signed)
Breakfast tray ordered for pt

## 2019-03-26 NOTE — Progress Notes (Signed)
Notified bedside nurse of need to draw lactic acid.  

## 2019-03-26 NOTE — Plan of Care (Signed)

## 2019-03-26 NOTE — ED Notes (Addendum)
Pt lying on bed - alert. Pt repeatedly asking where he is. Pt is alert to self. Pt drank 2 cups of water.

## 2019-03-26 NOTE — Progress Notes (Addendum)
Patient COVID+  Will start remdesivir per pharm.  Will change admit order to [facility] which is off of the O2 requirement protocol tonight given bed availability.

## 2019-03-26 NOTE — ED Notes (Signed)
Called [facility] for transport to [facility]  

## 2019-03-26 NOTE — ED Notes (Signed)
Patient transported to CT 

## 2019-03-26 NOTE — H&P (Signed)
History and Physical    Spencer Howard [id number] DOB: 1928/07/05 DOA: 03/25/2019  PCP: Haywood Pao, MD  Patient coming from: ECF  I have personally briefly reviewed patient's old medical records in [place]  Chief Complaint: Diarrhea, AMS  HPI: Spencer Howard is a 85 y.o. male with medical history significant of dementia associated with prior EtOHism, depression, HTN.  Patient lives in an ECF.  Pt sent in to ED today with AMS.  Staff noticed around 7 or 8:00 this morning patient did not seem himself but they felt like it improved. However this afternoon patient still seemed altered to them. He also was noted to have some loose stools today EMS reported a temperature of 100.7 and patient states he has not had much appetite. They initially stated that his oxygen was low and placed him on 3 L but EMS reported he was satting normally throughout transport. Patient denies any pain anywhere, shortness of breath or cough. However facility noted that they had 8 new cases of Covid within the last 2 days.   ED Course: CXR neg, UA with blood, but no signs of infection (cathed specimen).  WBC 19k.  POC COVID is negative.  Got rocephin in ED initially before UA was back.  Patient with ongoing diarrhea in ED, this was sent down for C.Diff testing.  On the quick scan: C.Diff antigen is positive, C.Diff Toxin is negative.  Initial lactate of 2.6, repeat is 2.8, given 500cc bolus looks like bolus after the 2nd lactate.  3rd lactate pending.  CT head without acute finding.   Review of Systems: As per HPI, otherwise all review of systems negative.  Past Medical History:  Diagnosis Date  . Alcohol abuse with alcohol-induced mental disorder ([place])   . Dementia associated with alcoholism ([place])    Alzheimer's dementia versus alcohol related dementia. 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 Depression   . High cholesterol   . Hypertension   .  Hypothyroidism (acquired)     Past Surgical History:  Procedure Laterality Date  . ELBOW SURGERY Left 1995   Broken  . NM MYOVIEW LTD  02/2016   Normal LV function - EF 60-65%. No EKG changes. No ischemia or infarction noted on imaging. LOW RISK  . TONSILLECTOMY  1942  . TRANSTHORACIC ECHOCARDIOGRAM  02/2016   Moderate concentric LVH with EF 55-60%. GR 1 DD. Mild LA dilation. Mildly increased PA pressures  . VARICOCELECTOMY  1967     reports that he quit smoking about 50 years ago. He has never used smokeless tobacco. He reports that he does not drink alcohol or use drugs.  No Known Allergies  Family History  Problem Relation Age of Onset  . Leukemia Father   . Colon cancer Mother   . Dementia Neg Hx      Prior to Admission medications   Medication Sig Start Date End Date Taking? Authorizing Provider  acetaminophen (TYLENOL) 325 MG tablet Take 650 mg by mouth every 6 (six) hours as needed.    [provider]  alendronate (FOSAMAX) 70 MG tablet Take 70 mg by mouth every Monday.  05/18/15   [provider]  Calcium Citrate-Vitamin D (CALCIUM CITRATE + D3 PO) Take 1 tablet by mouth daily.    [provider]  Cholecalciferol (VITAMIN D) 2000 units tablet Take 2,000 Units by mouth daily.    [provider]  cloNIDine (CATAPRES) 0.1 MG tablet 0.1 mg. Every 8 hours as  needed 11/29/17   [provider]  donepezil (ARICEPT) 10 MG tablet TAKE 1 TABLET TWICE A DAY 07/06/17   Melvenia Beam, MD  levothyroxine (SYNTHROID, LEVOTHROID) 75 MCG tablet Take 1 tablet (75 mcg total) by mouth daily before breakfast. 09/20/17   Geradine Girt, DO  lisinopril (PRINIVIL,ZESTRIL) 2.5 MG tablet 2.5 mg daily. 11/28/17   [provider]  LORazepam (ATIVAN) 0.5 MG tablet 0.5 mg at bedtime. As needed 10/26/17   [provider]  memantine (NAMENDA) 5 MG tablet Take 1 tablet (5 mg total) by mouth 2 (two) times daily. 05/29/17   Melvenia Beam, MD    midodrine (PROAMATINE) 10 MG tablet Take 1 tablet (10 mg total) by mouth 3 (three) times daily. Patient not taking: Reported on 12/04/2017 12/21/16   Melvenia Beam, MD  Multiple Vitamins-Minerals (CENTRUM SILVER 50+MEN) TABS Take 1 tablet by mouth daily with breakfast.    [provider]  PARoxetine (PAXIL) 10 MG tablet Take 10 mg by mouth 2 (two) times daily.  06/27/17   [provider]    Physical Exam: Vitals:   03/25/19 2200 03/25/19 2230 03/25/19 2300 03/25/19 2315  BP: (!) 162/71 (!) 166/70 (!) 155/74 (!) 152/70  Pulse: 75 75 78 83  Resp: 14 14 15 16   Temp:  98.7 F (37.1 C)    TempSrc:  Oral    SpO2: 100% 100% 100% 100%  Weight:      Height:        Constitutional: NAD, calm, comfortable Eyes: PERRL, lids and conjunctivae normal ENMT: Mucous membranes are moist. Posterior pharynx clear of any exudate or lesions.Normal dentition.  Neck: normal, supple, no masses, no thyromegaly Respiratory: clear to auscultation bilaterally, no wheezing, no crackles. Normal respiratory effort. No accessory muscle use.  Cardiovascular: Regular rate and rhythm, no murmurs / rubs / gallops. No extremity edema. 2+ pedal pulses. No carotid bruits.  Abdomen: no tenderness, no masses palpated. No hepatosplenomegaly. Bowel sounds positive.  Musculoskeletal: no clubbing / cyanosis. No joint deformity upper and lower extremities. Good ROM, no contractures. Normal muscle tone.  Skin: no rashes, lesions, ulcers. No induration Neurologic: CN 2-12 grossly intact. Sensation intact, DTR normal. Strength 5/5 in all 4.  Psychiatric: Resting comfortably, wakes up to voice and answers questions, doesn't seem completely oriented though, denies having any diarrhea for example.  Nurse tech in ED says that he "begs to differ".   Labs on Admission: I have personally reviewed following labs and imaging studies  CBC: Recent Labs  Lab 03/25/19 1833  WBC 19.3*  NEUTROABS 11.1*  HGB 13.4  HCT 40.4   MCV 115.4*  PLT 123XX123   Basic Metabolic Panel: Recent Labs  Lab 03/25/19 1833  NA 138  K 4.3  CL 102  CO2 22  GLUCOSE 154*  BUN 31*  CREATININE 2.60*  CALCIUM 9.2   GFR: Estimated Creatinine Clearance: 20.9 mL/min (A) (by C-G formula based on SCr of 2.6 mg/dL (H)). Liver Function Tests: Recent Labs  Lab 03/25/19 1833  AST 80*  ALT 34  ALKPHOS 45  BILITOT 0.9  PROT 6.7  ALBUMIN 3.2*   No results for input(s): LIPASE, AMYLASE in the last 168 hours. No results for input(s): AMMONIA in the last 168 hours. Coagulation Profile: Recent Labs  Lab 03/25/19 1833  INR 1.1   Cardiac Enzymes: No results for input(s): CKTOTAL, CKMB, CKMBINDEX, TROPONINI in the last 168 hours. BNP (last 3 results) No results for input(s): PROBNP in the last  8760 hours. HbA1C: No results for input(s): HGBA1C in the last 72 hours. CBG: Recent Labs  Lab 03/25/19 1824  GLUCAP 147*   Lipid Profile: No results for input(s): CHOL, HDL, LDLCALC, TRIG, CHOLHDL, LDLDIRECT in the last 72 hours. Thyroid Function Tests: No results for input(s): TSH, T4TOTAL, FREET4, T3FREE, THYROIDAB in the last 72 hours. Anemia Panel: No results for input(s): VITAMINB12, FOLATE, FERRITIN, TIBC, IRON, RETICCTPCT in the last 72 hours. Urine analysis:    Component Value Date/Time   COLORURINE AMBER (A) 03/25/2019 2114   APPEARANCEUR HAZY (A) 03/25/2019 2114   LABSPEC 1.018 03/25/2019 2114   PHURINE 6.0 03/25/2019 2114   GLUCOSEU NEGATIVE 03/25/2019 2114   HGBUR LARGE (A) 03/25/2019 2114   BILIRUBINUR NEGATIVE 03/25/2019 2114   KETONESUR 5 (A) 03/25/2019 2114   PROTEINUR 30 (A) 03/25/2019 2114   NITRITE NEGATIVE 03/25/2019 2114   LEUKOCYTESUR NEGATIVE 03/25/2019 2114    Radiological Exams on Admission: CT HEAD WO CONTRAST  Result Date: 03/25/2019 CLINICAL DATA:  Altered mental status EXAM: CT HEAD WITHOUT CONTRAST TECHNIQUE: Contiguous axial images were obtained from the base of the skull through the  vertex without intravenous contrast. COMPARISON:  MRI 10/25/2017, CT brain 10/25/2017 FINDINGS: Brain: No acute territorial infarction, hemorrhage, or intracranial mass. Atrophy and moderate hypodensity in the white matter consistent with chronic small vessel ischemic change. Stable enlarged ventricles. Vascular: No hyperdense vessels. Carotid vascular and vertebral artery calcification. Skull: Normal. Negative for fracture or focal lesion. Sinuses/Orbits: Mild mucosal thickening in the sinuses Other: None IMPRESSION: 1. No CT evidence for acute intracranial abnormality. 2. Atrophy and chronic small vessel ischemic changes of the white matter Electronically Signed   By: Donavan Foil M.D.   On: 03/25/2019 23:14   DG Chest Port 1 View  Result Date: 03/25/2019 CLINICAL DATA:  Fever EXAM: PORTABLE CHEST 1 VIEW COMPARISON:  07/13/2017 FINDINGS: No focal airspace disease or effusion. Atelectasis or scar at the left base. Normal heart size. Aortic atherosclerosis. No pneumothorax. Non inclusion of the right CP angle. IMPRESSION: No active disease.  Scarring or atelectasis at the left base. Electronically Signed   By: Donavan Foil M.D.   On: 03/25/2019 19:49    EKG: Independently reviewed.  Assessment/Plan Principal Problem:   Acute encephalopathy Active Problems:   Essential hypertension   Dementia, Alzheimer's, with behavior disturbance (HCC)   AKI (acute kidney injury) ([place])   Leukocytosis   Hematuria   Diarrhea    1. SIRS, diarrhea - 1. DDx includes but not limited to: [place] since there is a major outbreak at his facility (though usually dont see WBC elevation with COVID, and not having respiratory symptoms currently it seems). 2. C.Diff - is antigen positive but toxin negative 3. Other cause 2. IVF: 500cc bolus in ED and NS at 125 cc/hr 3. Starting PO vanc for C.Diff at this point 4. Check CT abd/pelvis non-contrast 1. Also to make sure no urinary obstruction / obstructing  stone 5. Trend lactates 6. Tylenol PRN fever 7. Repeat CBC in AM 2. Hematuria - 1. DDx includes traumatic cath used to get the UA vs other cause of hematuria 2. CT abd/pelvis non-contrast 3. Acute encephalopathy on chronic dementia - 1. Mild at this point 2. Due to #1 above 4. AKI on CKD stage 3 - alternatively could be progression of CKD since the last baseline we have on file is from 2019 1. Could be dehydration due to #1 above 2. IVF as above 3. Hold any nephrotoxic  meds when med rec completed 4. Repeat BMP in AM 5. CT to r/o obstruction 5. HTN - 1. Med rec pending  DVT prophylaxis: Heparin [place] Code Status: DNR - see consult note from 2019 Family Communication: no family in room Disposition Plan: SNF after admit Consults called: None Admission status: Admit to inpatient  Severity of Illness: The appropriate patient status for this patient is INPATIENT. Inpatient status is judged to be reasonable and necessary in order to provide the required intensity of service to ensure the patient's safety. The patient's presenting symptoms, physical exam findings, and initial radiographic and laboratory data in the context of their chronic comorbidities is felt to place them at high risk for further clinical deterioration. Furthermore, it is not anticipated that the patient will be medically stable for discharge from the hospital within 2 midnights of admission. The following factors support the patient status of inpatient.   IP status for treatment of diarrhea and AKI, possibly C.Diff.  * I certify that at the point of admission it is my clinical judgment that the patient will require inpatient hospital care spanning beyond 2 midnights from the point of admission due to high intensity of service, high risk for further deterioration and high frequency of surveillance required.*    Cleveland Yarbro M. DO [facility]  How to contact the [facility] Attending or Consulting provider [place] or  covering provider during after hours [place], for this patient?  1. Check the care team in [facility] and look for a) attending/consulting [facility] provider listed and b) the [facility] team listed 2. Log into [email or web address]  Amion Physician Scheduling and messaging for groups and whole hospitals  On call and physician scheduling software for group practices, residents, hospitalists and other medical providers for call, clinic, rotation and shift schedules. On[employer] is a hospital-wide system for scheduling doctors and paging doctors on call. EasyPlot is for scientific plotting and data analysis.  [email or web address]  and use [place]'s universal password to access. If you do not have the password, please contact the hospital operator.  3. Locate the [facility] provider you are looking for under [facility] and page to a number that you can be directly reached. 4. If you still have difficulty reaching the provider, please page the [facility] (Director on Call) for the Hospitalists listed on amion for assistance.  03/26/2019, 12:21 AM

## 2019-03-26 NOTE — ED Notes (Addendum)
[facility] transporting pt to [place]. SL IV. Pt ate few bites of breakfast. PT[facility] obtained all paperwork including pt's DNR form.

## 2019-03-26 NOTE — Progress Notes (Addendum)
1900 Report from [place] Shift assessment. VS WDL. Room air. Condom cath. Call bell in reach. Bed alarmed. Pt oriented to self. Disoriented to place and time.   2100 PM medications given.  2300 Rounds on pt, pt asleep in bed.  0000 Rounds on pt, VS WDL.   0200 Rounds on pt, pt asleep in bed,  0400 Rounds on pt, VS WDL. Pt had BM, cleaned up. Bed alarmed and placed in lowest position. AM medications given  0600 Rounds on pt, pt asleep in bed  0700 Report given to oncoming RN

## 2019-03-26 NOTE — ED Notes (Signed)
ED TO INPATIENT HANDOFF REPORT  ED Nurse Name and Phone #: Gwyndolyn Saxon [phone]  S Name/Age/Gender Spencer Howard 85 y.o. male Room/Bed: 052C/052C  Code Status   Code Status: DNR  Home/SNF/Other Nursing Home Patient oriented to: self and place Is this baseline? Yes   Triage Complete: Triage complete  Chief Complaint AKI (acute kidney injury) [facility]) [N17.9]  Triage Note Pt presents from [facility] home, called out for AMS related to episode of "stroke like sx" that began around 0700 and then resolved. Staff are poor historians per EMS, states that sx resolved but also state that pt is still altered and does not typically ask repetitive questions despite being in dementia unit of facility. Staff provided 3L O2 prior to EMS arrival to increase spO2 from 91% to 99%.  There have been 8 new cases of covid at [facility] per staff, concern that pt has been exposed.  EMS exam- repetitive questions, alert, EKG unremarkable, 20G L forearm, 100.74F, 112/60, 70bpm    Allergies No Known Allergies  Level of Care/Admitting Diagnosis ED Disposition    ED Disposition Condition [facility] Area: [place] [[id number]]  Level of Care: Med-Surg [16]  Covid Evaluation: Confirmed COVID Positive  Diagnosis: AKI (acute kidney injury) [facility]) [[id number]]  Admitting Physician: Doreatha Massed  Attending Physician: Etta Quill [phone]  Estimated length of stay: past midnight tomorrow  Certification:: I certify this patient will need inpatient services for at least 2 midnights       B Medical/Surgery History Past Medical History:  Diagnosis Date  . Alcohol abuse with alcohol-induced mental disorder ([place])   . Dementia associated with alcoholism ([place])    Alzheimer's dementia versus alcohol related dementia. 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 Depression   . High cholesterol   . Hypertension   . Hypothyroidism  (acquired)    Past Surgical History:  Procedure Laterality Date  . ELBOW SURGERY Left 1995   Broken  . NM MYOVIEW LTD  02/2016   Normal LV function - EF 60-65%. No EKG changes. No ischemia or infarction noted on imaging. LOW RISK  . TONSILLECTOMY  1942  . TRANSTHORACIC ECHOCARDIOGRAM  02/2016   Moderate concentric LVH with EF 55-60%. GR 1 DD. Mild LA dilation. Mildly increased PA pressures  . VARICOCELECTOMY  1967     A IV Location/Drains/Wounds Patient Lines/Drains/Airways Status   Active Line/Drains/Airways    Name:   Placement date:   Placement time:   Site:   Days:   Peripheral IV 03/25/19 Left Forearm   03/25/19    0000    Forearm   1   Peripheral IV 03/25/19 Right Wrist   03/25/19    2227    Wrist   1          Intake/Output Last 24 hours  Intake/Output Summary (Last 24 hours) at 03/26/2019 5974 Last data filed at 03/26/2019 1638 Gross per 24 hour  Intake 1033.33 ml  Output --  Net 1033.33 ml    Labs/Imaging Results for orders placed or performed during the hospital encounter of 03/25/19 (from the past 48 hour(s))  Blood Culture (routine x 2)     Status: None (Preliminary result)   Collection Time: 03/25/19  4:45 AM   Specimen: BLOOD RIGHT FOREARM  Result Value Ref Range   Specimen Description BLOOD RIGHT FOREARM    Special Requests      BOTTLES DRAWN AEROBIC AND ANAEROBIC Blood  Culture adequate volume Performed at [facility], [place] [address] [place], [place] [address]    Culture PENDING    Report Status PENDING   CBG monitoring, ED     Status: Abnormal   Collection Time: 03/25/19  6:24 PM  Result Value Ref Range   Glucose-Capillary 147 (H) 70 - 99 mg/dL  Comprehensive metabolic panel     Status: Abnormal   Collection Time: 03/25/19  6:33 PM  Result Value Ref Range   Sodium 138 135 - 145 mmol/L   Potassium 4.3 3.5 - 5.1 mmol/L   Chloride 102 98 - 111 mmol/L   CO2 22 22 - 32 mmol/L   Glucose, Bld 154 (H) 70 - 99 mg/dL   BUN 31 (H) 8 - 23 mg/dL    Creatinine, Ser 2.60 (H) 0.61 - 1.24 mg/dL   Calcium 9.2 8.9 - 10.3 mg/dL   Total Protein 6.7 6.5 - 8.1 g/dL   Albumin 3.2 (L) 3.5 - 5.0 g/dL   AST 80 (H) 15 - 41 U/L   ALT 34 0 - 44 U/L   Alkaline Phosphatase 45 38 - 126 U/L   Total Bilirubin 0.9 0.3 - 1.2 mg/dL   GFR calc non Af Amer 21 (L) >60 mL/min   GFR calc Af Amer 24 (L) >60 mL/min   Anion gap 14 5 - 15    Comment: Performed at [facility], [address] [place], [place] [address]  CBC WITH DIFFERENTIAL     Status: Abnormal   Collection Time: 03/25/19  6:33 PM  Result Value Ref Range   WBC 19.3 (H) 4.0 - 10.5 K/uL   RBC 3.50 (L) 4.22 - 5.81 MIL/uL   Hemoglobin 13.4 13.0 - 17.0 g/dL   HCT 40.4 39.0 - 52.0 %   MCV 115.4 (H) 80.0 - 100.0 fL   MCH 38.3 (H) 26.0 - 34.0 pg   MCHC 33.2 30.0 - 36.0 g/dL   RDW 14.7 11.5 - 15.5 %   Platelets 151 150 - 400 K/uL   nRBC 0.0 0.0 - 0.2 %   Neutrophils Relative % 58 %   Neutro Abs 11.1 (H) 1.7 - 7.7 K/uL   Lymphocytes Relative 9 %   Lymphs Abs 1.8 0.7 - 4.0 K/uL   Monocytes Relative 32 %   Monocytes Absolute 6.1 (H) 0.1 - 1.0 K/uL   Eosinophils Relative 0 %   Eosinophils Absolute 0.1 0.0 - 0.5 K/uL   Basophils Relative 0 %   Basophils Absolute 0.0 0.0 - 0.1 K/uL   Immature Granulocytes 1 %   Abs Immature Granulocytes 0.22 (H) 0.00 - 0.07 K/uL    Comment: Performed at [facility], [address], [place], [place] [address]  APTT     Status: Abnormal   Collection Time: 03/25/19  6:33 PM  Result Value Ref Range   aPTT 40 (H) 24 - 36 seconds    Comment:        IF BASELINE aPTT IS ELEVATED, SUGGEST PATIENT RISK ASSESSMENT BE USED TO DETERMINE APPROPRIATE ANTICOAGULANT THERAPY. Performed at [facility], [place] [address] [place], [place] [address]   Protime-INR     Status: None   Collection Time: 03/25/19  6:33 PM  Result Value Ref Range   Prothrombin Time 14.0 11.4 - 15.2 seconds   INR 1.1 0.8 - 1.2    Comment: (NOTE) INR goal varies based on device  and disease states. Performed at [facility],  [address], [place], [place] [address]   Lactic acid, plasma     Status: Abnormal   Collection Time: 03/25/19  6:38 PM  Result Value Ref Range   Lactic Acid, Venous 2.6 (HH) 0.5 - 1.9 mmol/L    Comment: CRITICAL RESULT CALLED TO, READ BACK BY AND VERIFIED WITH: RN B HONEYCUTT AT 1944 03/25/2019 BY L BENFIELD Performed at [facility], [place] [address], [place], [place] [address]   POC SARS Coronavirus 2 Ag-ED - Nasal Swab (BD Veritor Kit)     Status: None   Collection Time: 03/25/19  7:32 PM  Result Value Ref Range   SARS Coronavirus 2 Ag NEGATIVE NEGATIVE    Comment: (NOTE) SARS-CoV-2 antigen NOT DETECTED.  Negative results are presumptive.  Negative results do not preclude SARS-CoV-2 infection and should not be used as the sole basis for treatment or other patient management decisions, including infection  control decisions, particularly in the presence of clinical signs and  symptoms consistent with COVID-19, or in those who have been in contact with the virus.  Negative results must be combined with clinical observations, patient history, and epidemiological information. The expected result is Negative. Fact Sheet for Patients: PodPark.tn Fact Sheet for Healthcare Providers: GiftContent.is This test is not yet approved or cleared by the [place] FDA and  has been authorized for detection and/or diagnosis of SARS-CoV-2 by FDA under an Emergency Use Authorization (EUA).  This EUA will remain in effect (meaning this test can be used) for the duration of  the COVID-19 de claration under Section 564(b)(1) of the Act, 21 U.S.C. section 360bbb-3(b)(1), unless the authorization is terminated or revoked sooner.   SARS CORONAVIRUS 2 (TAT 6-24 HRS) Nasopharyngeal Nasopharyngeal Swab     Status: Abnormal   Collection Time: 03/25/19  7:51 PM   Specimen:  Nasopharyngeal Swab  Result Value Ref Range   SARS Coronavirus 2 POSITIVE (A) NEGATIVE    Comment: RESULT CALLED TO, READ BACK BY AND VERIFIED WITH: MONIQUE Rami Waddle RN._0  ON 12.29.2020 BY TCALDWELL MT. (NOTE) SARS-CoV-2 target nucleic acids are DETECTED. The SARS-CoV-2 RNA is generally detectable in upper and lower respiratory specimens during the acute phase of infection. Positive results are indicative of the presence of SARS-CoV-2 RNA. Clinical correlation with patient history and other diagnostic information is  necessary to determine patient infection status. Positive results do not rule out bacterial infection or co-infection with other viruses.  The expected result is Negative. Fact Sheet for Patients: SugarRoll.be Fact Sheet for Healthcare Providers: https://www.woods-mathews.com/ This test is not yet approved or cleared by the [place] FDA and  has been authorized for detection and/or diagnosis of SARS-CoV-2 by FDA under an Emergency Use Authorization (EUA). This EUA will remain  in effect (meaning this test ca n be used) for the duration of the COVID-19 declaration under Section 564(b)(1) of the Act, 21 U.S.C. section 360bbb-3(b)(1), unless the authorization is terminated or revoked sooner. Performed at [facility], [place] [address], [place], [place] [address]   Lactic acid, plasma     Status: Abnormal   Collection Time: 03/25/19  8:38 PM  Result Value Ref Range   Lactic Acid, Venous 2.8 (HH) 0.5 - 1.9 mmol/L    Comment: CRITICAL VALUE NOTED.  VALUE IS CONSISTENT WITH PREVIOUSLY REPORTED AND CALLED VALUE. Performed at [facility], [place] [address] [place], [place] [address]   Urinalysis, Routine w reflex microscopic     Status: Abnormal   Collection Time:  03/25/19  9:14 PM  Result Value Ref Range   Color, Urine AMBER (A) YELLOW    Comment: BIOCHEMICALS MAY BE AFFECTED BY COLOR   APPearance HAZY (A) CLEAR    Specific Gravity, Urine 1.018 1.005 - 1.030   pH 6.0 5.0 - 8.0   Glucose, UA NEGATIVE NEGATIVE mg/dL   Hgb urine dipstick LARGE (A) NEGATIVE   Bilirubin Urine NEGATIVE NEGATIVE   Ketones, ur 5 (A) NEGATIVE mg/dL   Protein, ur 30 (A) NEGATIVE mg/dL   Nitrite NEGATIVE NEGATIVE   Leukocytes,Ua NEGATIVE NEGATIVE   RBC / HPF >50 (H) 0 - 5 RBC/hpf   Bacteria, UA NONE SEEN NONE SEEN   Squamous Epithelial / LPF 0-5 0 - 5   Mucus PRESENT    Hyaline Casts, UA PRESENT     Comment: Performed at [facility], [place] [address], [place], [place] [address]  C difficile quick scan w PCR reflex     Status: Abnormal   Collection Time: 03/25/19 10:58 PM   Specimen: STOOL  Result Value Ref Range   C Diff antigen POSITIVE (A) NEGATIVE   C Diff toxin NEGATIVE NEGATIVE   C Diff interpretation Results are indeterminate. See PCR results.     Comment: Performed at [facility], [place] [address], [place], [place] [address]  C. Diff by PCR, Reflexed     Status: Abnormal   Collection Time: 03/25/19 10:58 PM  Result Value Ref Range   Toxigenic C. Difficile by PCR POSITIVE (A) NEGATIVE    Comment: Positive for toxigenic C. difficile with little to no toxin production. Only treat if clinical presentation suggests symptomatic illness. Performed at [facility], [place] [address] [place], [place] [address]   Lactic acid, plasma     Status: Abnormal   Collection Time: 03/26/19 12:29 AM  Result Value Ref Range   Lactic Acid, Venous 2.6 (HH) 0.5 - 1.9 mmol/L    Comment: CRITICAL VALUE NOTED.  VALUE IS CONSISTENT WITH PREVIOUSLY REPORTED AND CALLED VALUE. Performed at [facility], [place] [address] [place], [place] [address]   CBC     Status: Abnormal   Collection Time: 03/26/19  2:57 AM  Result Value Ref Range   WBC 30.3 (H) 4.0 - 10.5 K/uL   RBC 3.29 (L) 4.22 - 5.81 MIL/uL   Hemoglobin 12.6 (L) 13.0 - 17.0 g/dL   HCT 37.3 (L) 39.0 - 52.0 %   MCV 113.4 (H) 80.0 - 100.0 fL   MCH 38.3 (H) 26.0  - 34.0 pg   MCHC 33.8 30.0 - 36.0 g/dL   RDW 14.9 11.5 - 15.5 %   Platelets 151 150 - 400 K/uL   nRBC 0.0 0.0 - 0.2 %    Comment: Performed at [facility], [place] [address] [place], [place] [address]  Comprehensive metabolic panel     Status: Abnormal   Collection Time: 03/26/19  2:57 AM  Result Value Ref Range   Sodium 141 135 - 145 mmol/L   Potassium 4.4 3.5 - 5.1 mmol/L   Chloride 105 98 - 111 mmol/L   CO2 24 22 - 32 mmol/L   Glucose, Bld 120 (H) 70 - 99 mg/dL   BUN 39 (H) 8 - 23 mg/dL   Creatinine, Ser 2.70 (H) 0.61 - 1.24 mg/dL   Calcium 8.7 (L) 8.9 - 10.3 mg/dL   Total Protein 6.1 (L) 6.5 - 8.1 g/dL   Albumin 2.9 (L) 3.5 - 5.0 g/dL  AST 88 (H) 15 - 41 U/L   ALT 35 0 - 44 U/L   Alkaline Phosphatase 40 38 - 126 U/L   Total Bilirubin 0.4 0.3 - 1.2 mg/dL   GFR calc non Af Amer 20 (L) >60 mL/min   GFR calc Af Amer 23 (L) >60 mL/min   Anion gap 12 5 - 15    Comment: Performed at [place] [address], [place], [place] [address]  D-dimer, quantitative (not at [facility])     Status: Abnormal   Collection Time: 03/26/19  2:57 AM  Result Value Ref Range   D-Dimer, Quant 1.67 (H) 0.00 - 0.50 ug/mL-FEU    Comment: (NOTE) At the manufacturer cut-off of 0.50 ug/mL FEU, this assay has been documented to exclude PE with a sensitivity and negative predictive value of 97 to 99%.  At this time, this assay has not been approved by the FDA to exclude DVT/VTE. Results should be correlated with clinical presentation. Performed at [facility], [place] [address], [place], [place] [address]   C-reactive protein     Status: Abnormal   Collection Time: 03/26/19  2:57 AM  Result Value Ref Range   CRP 11.1 (H) <1.0 mg/dL    Comment: Performed at [facility] [address], [place], [place] [address]  ABO/Rh     Status: None   Collection Time: 03/26/19  4:45 AM  Result Value Ref Range   ABO/RH(D)      A POS Performed at [facility] [address],  [place], [place] [address]    CT HEAD WO CONTRAST  Result Date: 03/25/2019 CLINICAL DATA:  Altered mental status EXAM: CT HEAD WITHOUT CONTRAST TECHNIQUE: Contiguous axial images were obtained from the base of the skull through the vertex without intravenous contrast. COMPARISON:  MRI 10/25/2017, CT brain 10/25/2017 FINDINGS: Brain: No acute territorial infarction, hemorrhage, or intracranial mass. Atrophy and moderate hypodensity in the white matter consistent with chronic small vessel ischemic change. Stable enlarged ventricles. Vascular: No hyperdense vessels. Carotid vascular and vertebral artery calcification. Skull: Normal. Negative for fracture or focal lesion. Sinuses/Orbits: Mild mucosal thickening in the sinuses Other: None IMPRESSION: 1. No CT evidence for acute intracranial abnormality. 2. Atrophy and chronic small vessel ischemic changes of the white matter Electronically Signed   By: Donavan Foil M.D.   On: 03/25/2019 23:14   DG Chest Port 1 View  Result Date: 03/25/2019 CLINICAL DATA:  Fever EXAM: PORTABLE CHEST 1 VIEW COMPARISON:  07/13/2017 FINDINGS: No focal airspace disease or effusion. Atelectasis or scar at the left base. Normal heart size. Aortic atherosclerosis. No pneumothorax. Non inclusion of the right CP angle. IMPRESSION: No active disease.  Scarring or atelectasis at the left base. Electronically Signed   By: Donavan Foil M.D.   On: 03/25/2019 19:49   CT Renal Stone Study  Result Date: 03/26/2019 CLINICAL DATA:  Hematuria from unknown cause. Loss of appetite fever and diarrhea. COVID-19 positive EXAM: CT ABDOMEN AND PELVIS WITHOUT CONTRAST TECHNIQUE: Multidetector CT imaging of the abdomen and pelvis was performed following the standard protocol without IV contrast. COMPARISON:  03/09/2017 FINDINGS: Lower chest: Volume loss and subpleural reticular density in the left lower lobe which has a scar-like or atelectatic appearance. Coronary calcification. Hepatobiliary: No focal  liver abnormality.No evidence of biliary obstruction or stone. Pancreas: Generalized atrophy Spleen: Unremarkable. Adrenals/Urinary Tract: Negative adrenals. No hydronephrosis or stone. Chronic bladder wall thickening with diverticula/cellule suggesting chronic outlet obstruction. Stomach/Bowel:  Mild stranding around the rectum without underlying wall thickening. Sigmoid diverticulosis without active inflammation. No bowel obstruction or appendicitis. Vascular/Lymphatic: Multifocal atherosclerotic calcification with implied high-grade bilateral proximal common iliac stenosis due to the degree of bulky calcified plaque. No mass or adenopathy. Reproductive:Negative Other: No ascites or pneumoperitoneum. Musculoskeletal: Unusual generalized asymmetric thickening of the obturators internus on the right. No underlying fracture or hip joint effusion is seen. There is spinal degeneration with L4-5 anterolisthesis and high-grade lower lumbar spinal stenosis. Remote T11 compression fracture with advanced height loss IMPRESSION: 1. No specific explanation for hematuria. There are changes of chronic bladder outlet obstruction with only mild bladder distension today. 2. Abnormal asymmetric thickening of the right obturators interna with pelvic fat edema, question right hip pain and trauma. Pelvis MRI would be the most definitive study for workup. 3. Chronic findings are described above. Electronically Signed   By: Monte Fantasia M.D.   On: 03/26/2019 04:29    Pending Labs Unresulted Labs (From admission, onward)    Start     Ordered   03/27/19 0500  C-reactive protein  Daily,   R     03/26/19 0257   03/27/19 0500  D-dimer, quantitative (not at [facility])  Daily,   R     03/26/19 0257   03/27/19 0500  CBC with Differential/Platelet  Daily,   R     03/26/19 0257   03/27/19 0500  Comprehensive metabolic panel  Daily,   R     03/26/19 0257   03/26/19 0257  Procalcitonin  Once,   STAT     03/26/19 0257   03/25/19 1838   Blood Culture (routine x 2)  BLOOD CULTURE X 2,   STAT     03/25/19 1839   03/25/19 1838  Urine culture  ONCE - STAT,   STAT     03/25/19 1839          Vitals/Pain Today's Vitals   03/26/19 0015 03/26/19 0030 03/26/19 0133 03/26/19 0507  BP: (!) 150/67 (!) 154/78  (!) 154/81  Pulse: 82 89  88  Resp: _0 Temp:    (!) 97.5 F (36.4 C)  TempSrc:      SpO2: 100% 100%  100%  Weight:      Height:      PainSc:   Asleep     Isolation Precautions Airborne and Contact precautions  Medications Medications  0.9 %  sodium chloride infusion ( Intravenous New Bag/Given 03/26/19 0520)  acetaminophen (TYLENOL) tablet 650 mg (has no administration in time range)    Or  acetaminophen (TYLENOL) suppository 650 mg (has no administration in time range)  ondansetron (ZOFRAN) tablet 4 mg (has no administration in time range)    Or  ondansetron (ZOFRAN) injection 4 mg (has no administration in time range)  heparin injection 5,000 Units (5,000 Units Subcutaneous Given 03/26/19 0521)  vancomycin (VANCOCIN) 50 mg/mL oral solution 125 mg (125 mg Oral Given 03/26/19 0036)  remdesivir 200 mg in sodium chloride 0.9% 250 mL IVPB (0 mg Intravenous Stopped 03/26/19 0519)    Followed by  remdesivir 100 mg in sodium chloride 0.9 % 100 mL IVPB (has no administration in time range)  sodium chloride 0.9 % bolus 500 mL (0 mLs Intravenous Stopped 03/25/19 2228)    Mobility walks with person assist     Focused Assessments Neuro Assessment Handoff:  Swallow screen pass? n/a   NIH Stroke Scale ( + Modified Stroke Scale Criteria)  Interval: Initial Level  of Consciousness (1a.)   : Alert, keenly responsive LOC Questions (1b. )   +: Answers one question correctly(baseline dementia) LOC Commands (1c. )   + : Performs both tasks correctly Best Gaze (2. )  +: Normal Visual (3. )  +: No visual loss Facial Palsy (4. )    : Normal symmetrical movements Motor Arm, Left (5a. )   +: No drift Motor Arm,  Right (5b. )   +: No drift Motor Leg, Left (6a. )   +: No drift Motor Leg, Right (6b. )   +: No drift Limb Ataxia (7. ): Absent Sensory (8. )   +: Normal, no sensory loss Best Language (9. )   +: No aphasia Dysarthria (10. ): Normal Extinction/Inattention (11.)   +: No Abnormality Modified SS Total  +: 1 Complete NIHSS TOTAL: 1 Last date known well: 03/25/19 Last time known well: 0700 Neuro Assessment:   Neuro Checks:   Initial (03/25/19 1832)  Last Documented NIHSS Modified Score: 1 (03/26/19 0042) Has TPA been given? No If patient is a Neuro Trauma and patient is going to OR before floor call report to [place] nurse: [phone] or [phone]     R Recommendations: See Admitting Provider Note  Report given to: Essie Hart RN   Additional Notes:  Patient is from [facility] where he was exposed and 8 new cases at facility; Patient has alcohol induced dementia and at base line A&Ox 2; Patient also has C-diff; Patient had about 3-4 stools while in ED; [facility] staff has been notified of patient's admission to [facility]

## 2019-03-26 NOTE — Progress Notes (Signed)
Attempted to call patient's wife, Manuela Schwartz, to give update,  no answer at this time.

## 2019-03-26 NOTE — ED Notes (Signed)
Called [facility] for transport to [facility]--Wallie Lagrand  

## 2019-03-27 ENCOUNTER — Encounter (HOSPITAL_COMMUNITY): Payer: Self-pay

## 2019-03-27 DIAGNOSIS — A0472 Enterocolitis due to Clostridium difficile, not specified as recurrent: Secondary | ICD-10-CM

## 2019-03-27 DIAGNOSIS — J1289 Other viral pneumonia: Secondary | ICD-10-CM

## 2019-03-27 DIAGNOSIS — U071 COVID-19: Secondary | ICD-10-CM

## 2019-03-27 LAB — CBC WITH DIFFERENTIAL/PLATELET
Abs Immature Granulocytes: 0 10*3/uL (ref 0.00–0.07)
Band Neutrophils: 12 %
Basophils Absolute: 0 10*3/uL (ref 0.0–0.1)
Basophils Relative: 0 %
Eosinophils Absolute: 0 10*3/uL (ref 0.0–0.5)
Eosinophils Relative: 0 %
HCT: 28.9 % — ABNORMAL LOW (ref 39.0–52.0)
Hemoglobin: 9.7 g/dL — ABNORMAL LOW (ref 13.0–17.0)
Lymphocytes Relative: 6 %
Lymphs Abs: 1.1 10*3/uL (ref 0.7–4.0)
MCH: 38.5 pg — ABNORMAL HIGH (ref 26.0–34.0)
MCHC: 33.6 g/dL (ref 30.0–36.0)
MCV: 114.7 fL — ABNORMAL HIGH (ref 80.0–100.0)
Monocytes Absolute: 1.5 10*3/uL — ABNORMAL HIGH (ref 0.1–1.0)
Monocytes Relative: 8 %
Neutro Abs: 15.9 10*3/uL — ABNORMAL HIGH (ref 1.7–7.7)
Neutrophils Relative %: 74 %
Platelets: 144 10*3/uL — ABNORMAL LOW (ref 150–400)
RBC: 2.52 MIL/uL — ABNORMAL LOW (ref 4.22–5.81)
RDW: 15 % (ref 11.5–15.5)
WBC: 18.5 10*3/uL — ABNORMAL HIGH (ref 4.0–10.5)
nRBC: 0 % (ref 0.0–0.2)

## 2019-03-27 LAB — COMPREHENSIVE METABOLIC PANEL
ALT: 34 U/L (ref 0–44)
AST: 81 U/L — ABNORMAL HIGH (ref 15–41)
Albumin: 2.5 g/dL — ABNORMAL LOW (ref 3.5–5.0)
Alkaline Phosphatase: 37 U/L — ABNORMAL LOW (ref 38–126)
Anion gap: 9 (ref 5–15)
BUN: 47 mg/dL — ABNORMAL HIGH (ref 8–23)
CO2: 20 mmol/L — ABNORMAL LOW (ref 22–32)
Calcium: 7.8 mg/dL — ABNORMAL LOW (ref 8.9–10.3)
Chloride: 105 mmol/L (ref 98–111)
Creatinine, Ser: 1.79 mg/dL — ABNORMAL HIGH (ref 0.61–1.24)
GFR calc Af Amer: 38 mL/min — ABNORMAL LOW (ref >60)
GFR calc non Af Amer: 33 mL/min — ABNORMAL LOW (ref >60)
Glucose, Bld: 74 mg/dL (ref 70–99)
Potassium: 4.1 mmol/L (ref 3.5–5.1)
Sodium: 134 mmol/L — ABNORMAL LOW (ref 135–145)
Total Bilirubin: 0.9 mg/dL (ref 0.3–1.2)
Total Protein: 5.3 g/dL — ABNORMAL LOW (ref 6.5–8.1)

## 2019-03-27 LAB — MAGNESIUM: Magnesium: 1.9 mg/dL (ref 1.7–2.4)

## 2019-03-27 LAB — C-REACTIVE PROTEIN: CRP: 10 mg/dL — ABNORMAL HIGH (ref <1.0)

## 2019-03-27 MED ORDER — DIVALPROEX SODIUM 125 MG PO DR TAB
125.0000 mg | DELAYED_RELEASE_TABLET | Freq: Two times a day (BID) | ORAL | Status: DC
  Administered 2019-03-27 – 2019-03-31 (×8): 125 mg via ORAL
  Filled 2019-03-27 (×10): qty 1

## 2019-03-27 NOTE — Progress Notes (Signed)
PROGRESS NOTE  Spencer Howard [id number] DOB: 10/10/1928 DOA: 03/25/2019  PCP: Haywood Pao, MD  Brief History/Interval Summary: 85 y.o. male with medical history significant of dementia associated with prior EtOHism, depression, HTN.  Patient lives in a nursing facility.  Pt sent in to ED with AMS.  There was some concern for syncopal episode as well.  There was also apparently some concern for strokelike symptoms.  He was also experiencing diarrhea at the nursing facility.  CT head did not show any acute findings.  Stool tested positive for C. difficile.  He was also positive for COVID-19.  He was hospitalized for further management.  Reason for Visit: C. difficile colitis.  COVID-19 infection  Consultants: None  Procedures: None  Antibiotics: Anti-infectives (From admission, onward)   Start     Dose/Rate Route Frequency Ordered Stop   03/27/19 1000  remdesivir 100 mg in sodium chloride 0.9 % 100 mL IVPB     100 mg 200 mL/hr over 30 Minutes Intravenous Daily 03/26/19 0255 03/31/19 0959   03/26/19 0400  remdesivir 200 mg in sodium chloride 0.9% 250 mL IVPB     200 mg 580 mL/hr over 30 Minutes Intravenous Once 03/26/19 0255 03/26/19 0519   03/26/19 0030  vancomycin (VANCOCIN) 50 mg/mL oral solution 125 mg     125 mg Oral 4 times daily 03/26/19 0019 04/04/19 2159   03/25/19 2030  cefTRIAXone (ROCEPHIN) 1 g in sodium chloride 0.9 % 100 mL IVPB  Status:  Discontinued     1 g 200 mL/hr over 30 Minutes Intravenous Every 24 hours 03/25/19 2014 03/25/19 2326      Subjective/Interval History: Patient pleasantly confused.  Keeps asking the same questions over and over again.  Denies any pain.  Does not appear to be in any discomfort.    Assessment/Plan:  Acute Hypoxic Resp. Failure/Pneumonia due to COVID-19    Recent Labs  Lab 03/25/19 1833 03/26/19 0257 03/27/19 0257  DDIMER  --  1.67*  --   CRP  --  11.1* 10.0*  ALT 34 35 34  PROCALCITON  --  31.79  --      Objective findings: Fever: Afebrile in the last 24 hours. Oxygen requirements: Saturating normal on room air.  COVID 19 Therapeutics: Antibacterials: Oral vancomycin Remdesivir: Day 2 Steroids: None Diuretics: None Actemra: Not given Convalescent Plasma: Not given DVT Prophylaxis: Subcutaneous heparin  It appears that patient was initially hypoxic requiring 2 to 3 L of oxygen.  He was quickly weaned off of it.  Chest x-ray did not show any active disease but clinically he was thought to have pneumonia.  He was started on remdesivir.  No steroids due to C. difficile infection.  Seems to be stable from a respiratory standpoint.  Elevated CRP could also be from his GI infection.  Procalcitonin significantly elevated at 31.  Likely due to C. difficile.  C. difficile colitis with sepsis Stool study positive for C. difficile.  Patient has been having multiple episodes of diarrhea.  WBC was significantly elevated.  Procalcitonin significantly elevated.  Patient currently on oral vancomycin.  Wait for clinical improvement.  Patient with elevated WBC, low-grade fever, also with a renal failure and so thought to have sepsis.  Hematuria UA shows large hemoglobin with more than 50 RBCs.  No evidence for infection.  Could have been traumatic due to catheter insertion done for obtaining a urine sample.  CT renal studies did not show any acute changes.  Continue to monitor for  now.  Acute metabolic encephalopathy This is in the setting of known dementia.  He could be close to his baseline currently.  Does not have any neurological deficits.  Acute kidney injury on chronic kidney disease stage III Creatinine 1.23 and July 2019.  Came in with a creatinine of 2.6.  Hydrated.  Improved to 1.79 today.  Continue to monitor urine output.  Avoid nephrotoxic agents.  Essential hypertension Monitor blood pressures closely. Not on any antihypertensives currently.  Macrocytic anemia Drop in hemoglobin is  noted.  Likely due to dilution.  Check anemia panel in the morning.  No overt blood loss except for the hematuria.  History of dementia Continue with his home medications.  Other incidental findings on CT renal study Abnormal asymmetric thickening of the right obturators interna with pelvic fat edema noted.  Patient however does not have any pain in that area.  No further work-up at this time.  Try to mobilize.  DVT Prophylaxis: Subcutaneous heparin Code Status: DNR Family Communication: His wife to be updated daily. Disposition Plan: Hopefully return to skilled nursing facility when improved.   Medications:  Scheduled: . donepezil  10 mg Oral Daily  . heparin  5,000 Units Subcutaneous Q8H  . levothyroxine  75 mcg Oral Q0600  . memantine  5 mg Oral BID  . multivitamin with minerals  1 tablet Oral Q breakfast  . vancomycin  125 mg Oral QID   Continuous: . sodium chloride 75 mL/hr at 03/26/19 2100  . remdesivir 100 mg in NS 100 mL 100 mg (03/27/19 0927)   [id number] **OR** acetaminophen, lip balm, ondansetron **OR** ondansetron (ZOFRAN) IV   Objective:  Vital Signs  Vitals:   03/26/19 1000 03/26/19 1538 03/26/19 2106 03/27/19 0356  BP: (!) 128/92 (!) 148/76 136/81 129/60  Pulse: 85 90 89 87  Resp: 12 11 16 19   Temp: 97.8 F (36.6 C) 97.6 F (36.4 C) 98 F (36.7 C) 98.1 F (36.7 C)  TempSrc: Oral Oral Oral Oral  SpO2: 100% 100% 97% 97%  Weight:      Height:        Intake/Output Summary (Last 24 hours) at 03/27/2019 1141 Last data filed at 03/27/2019 0525 Gross per 24 hour  Intake 3372.13 ml  Output 1100 ml  Net 2272.13 ml   Filed Weights   03/25/19 2108  Weight: 86.2 kg    General appearance: Awake alert.  In no distress.  Mildly distracted Resp: Normal effort noted.  No wheezing or rhonchi.  Few crackles at the bases. Cardio: S1-S2 is normal regular.  No S3-S4.  No rubs murmurs or bruit GI: Abdomen is soft.  Nontender nondistended.  Bowel sounds  are present normal.  No masses organomegaly Extremities: No edema.  Neurologic: Disoriented.  No focal neurological deficits.   Lab Results:  Data Reviewed: I have personally reviewed following labs and imaging studies  CBC: Recent Labs  Lab 03/25/19 1833 03/26/19 0257 03/27/19 0257  WBC 19.3* 30.3* 18.5*  NEUTROABS 11.1*  --  15.9*  HGB 13.4 12.6* 9.7*  HCT 40.4 37.3* 28.9*  MCV 115.4* 113.4* 114.7*  PLT 151 151 144*    Basic Metabolic Panel: Recent Labs  Lab 03/25/19 1833 03/26/19 0257 03/27/19 0257  NA 138 141 134*  K 4.3 4.4 4.1  CL 102 105 105  CO2 22 24 20*  GLUCOSE 154* 120* 74  BUN 31* 39* 47*  CREATININE 2.60* 2.70* 1.79*  CALCIUM 9.2 8.7* 7.8*  MG  --   --  1.9    GFR: Estimated Creatinine Clearance: 30.4 mL/min (A) (by C-G formula based on SCr of 1.79 mg/dL (H)).  Liver Function Tests: Recent Labs  Lab 03/25/19 1833 03/26/19 0257 03/27/19 0257  AST 80* 88* 81*  ALT 34 35 34  ALKPHOS 45 40 37*  BILITOT 0.9 0.4 0.9  PROT 6.7 6.1* 5.3*  ALBUMIN 3.2* 2.9* 2.5*     Coagulation Profile: Recent Labs  Lab 03/25/19 1833  INR 1.1     CBG: Recent Labs  Lab 03/25/19 1824  GLUCAP 147*      Recent Results (from the past 240 hour(s))  Blood Culture (routine x 2)     Status: None (Preliminary result)   Collection Time: 03/25/19  4:45 AM   Specimen: BLOOD RIGHT FOREARM  Result Value Ref Range Status   Specimen Description BLOOD RIGHT FOREARM  Final   Special Requests   Final    BOTTLES DRAWN AEROBIC AND ANAEROBIC Blood Culture adequate volume   Culture   Final    NO GROWTH 1 DAY Performed at [facility], [place] [address] [place], [place] [address]    Report Status PENDING  Incomplete  Blood Culture (routine x 2)     Status: None (Preliminary result)   Collection Time: 03/25/19  6:33 PM   Specimen: BLOOD  Result Value Ref Range Status   Specimen Description BLOOD RIGHT ANTECUBITAL  Final   Special Requests   Final    BOTTLES  DRAWN AEROBIC AND ANAEROBIC Blood Culture results may not be optimal due to an inadequate volume of blood received in culture bottles   Culture   Final    NO GROWTH 2 DAYS Performed at [facility], [place] [address], [place], [place] [address]    Report Status PENDING  Incomplete  SARS CORONAVIRUS 2 (TAT 6-24 HRS) Nasopharyngeal Nasopharyngeal Swab     Status: Abnormal   Collection Time: 03/25/19  7:51 PM   Specimen: Nasopharyngeal Swab  Result Value Ref Range Status   SARS Coronavirus 2 POSITIVE (A) NEGATIVE Final    Comment: RESULT CALLED TO, READ BACK BY AND VERIFIED WITH: MONIQUE DAVIS [email or web address]  ON 12.29.2020 BY TCALDWELL MT. (NOTE) SARS-CoV-2 target nucleic acids are DETECTED. The SARS-CoV-2 RNA is generally detectable in upper and lower respiratory specimens during the acute phase of infection. Positive results are indicative of the presence of SARS-CoV-2 RNA. Clinical correlation with patient history and other diagnostic information is  necessary to determine patient infection status. Positive results do not rule out bacterial infection or co-infection with other viruses.  The expected result is Negative. Fact Sheet for Patients: SugarRoll.be Fact Sheet for Healthcare Providers: https://www.woods-mathews.com/ This test is not yet approved or cleared by the [place] FDA and  has been authorized for detection and/or diagnosis of SARS-CoV-2 by FDA under an Emergency Use Authorization (EUA). This EUA will remain  in effect (meaning this test ca n be used) for the duration of the COVID-19 declaration under Section 564(b)(1) of the Act, 21 U.S.C. section 360bbb-3(b)(1), unless the authorization is terminated or revoked sooner. Performed at [facility], [place] [address], [place], [place] [address]   Urine culture     Status: Abnormal   Collection Time: 03/25/19  9:07 PM   Specimen: In/Out Cath Urine  Result Value Ref Range  Status   Specimen Description IN/OUT CATH URINE  Final   Special Requests   Final    NONE Performed at [facility], [address] [place], [place] [address]    Culture MULTIPLE SPECIES PRESENT, SUGGEST RECOLLECTION (A)  Final   Report Status 03/26/2019 FINAL  Final  C difficile quick scan w PCR reflex     Status: Abnormal   Collection Time: 03/25/19 10:58 PM   Specimen: STOOL  Result Value Ref Range Status   C Diff antigen POSITIVE (A) NEGATIVE Final   C Diff toxin NEGATIVE NEGATIVE Final   C Diff interpretation Results are indeterminate. See PCR results.  Final    Comment: Performed at [facility], [place] [address] [place], [place] [address]  C. Diff by PCR, Reflexed     Status: Abnormal   Collection Time: 03/25/19 10:58 PM  Result Value Ref Range Status   Toxigenic C. Difficile by PCR POSITIVE (A) NEGATIVE Final    Comment: Positive for toxigenic C. difficile with little to no toxin production. Only treat if clinical presentation suggests symptomatic illness. Performed at [facility], [place] [address] [place], [place] [address]       Radiology Studies: CT HEAD WO CONTRAST  Result Date: 03/25/2019 CLINICAL DATA:  Altered mental status EXAM: CT HEAD WITHOUT CONTRAST TECHNIQUE: Contiguous axial images were obtained from the base of the skull through the vertex without intravenous contrast. COMPARISON:  MRI 10/25/2017, CT brain 10/25/2017 FINDINGS: Brain: No acute territorial infarction, hemorrhage, or intracranial mass. Atrophy and moderate hypodensity in the white matter consistent with chronic small vessel ischemic change. Stable enlarged ventricles. Vascular: No hyperdense vessels. Carotid vascular and vertebral artery calcification. Skull: Normal. Negative for fracture or focal lesion. Sinuses/Orbits: Mild mucosal thickening in the sinuses Other: None IMPRESSION: 1. No CT evidence for acute intracranial abnormality. 2. Atrophy and chronic small vessel ischemic  changes of the white matter Electronically Signed   By: Donavan Foil M.D.   On: 03/25/2019 23:14   DG Chest Port 1 View  Result Date: 03/25/2019 CLINICAL DATA:  Fever EXAM: PORTABLE CHEST 1 VIEW COMPARISON:  07/13/2017 FINDINGS: No focal airspace disease or effusion. Atelectasis or scar at the left base. Normal heart size. Aortic atherosclerosis. No pneumothorax. Non inclusion of the right CP angle. IMPRESSION: No active disease.  Scarring or atelectasis at the left base. Electronically Signed   By: Donavan Foil M.D.   On: 03/25/2019 19:49   CT Renal Stone Study  Result Date: 03/26/2019 CLINICAL DATA:  Hematuria from unknown cause. Loss of appetite fever and diarrhea. COVID-19 positive EXAM: CT ABDOMEN AND PELVIS WITHOUT CONTRAST TECHNIQUE: Multidetector CT imaging of the abdomen and pelvis was performed following the standard protocol without IV contrast. COMPARISON:  03/09/2017 FINDINGS: Lower chest: Volume loss and subpleural reticular density in the left lower lobe which has a scar-like or atelectatic appearance. Coronary calcification. Hepatobiliary: No focal liver abnormality.No evidence of biliary obstruction or stone. Pancreas: Generalized atrophy Spleen: Unremarkable. Adrenals/Urinary Tract: Negative adrenals. No hydronephrosis or stone. Chronic bladder wall thickening with diverticula/cellule suggesting chronic outlet obstruction. Stomach/Bowel: Mild stranding around the rectum without underlying wall thickening. Sigmoid diverticulosis without active inflammation. No bowel obstruction or appendicitis. Vascular/Lymphatic: Multifocal atherosclerotic calcification with implied high-grade bilateral proximal common iliac stenosis due to the degree of bulky calcified plaque. No mass or adenopathy. Reproductive:Negative Other: No ascites or pneumoperitoneum. Musculoskeletal: Unusual generalized asymmetric thickening of the obturators internus on the right. No underlying fracture or hip joint effusion  is seen. There is spinal degeneration with L4-5 anterolisthesis and high-grade lower lumbar spinal stenosis. Remote T11 compression fracture with advanced height loss IMPRESSION:  1. No specific explanation for hematuria. There are changes of chronic bladder outlet obstruction with only mild bladder distension today. 2. Abnormal asymmetric thickening of the right obturators interna with pelvic fat edema, question right hip pain and trauma. Pelvis MRI would be the most definitive study for workup. 3. Chronic findings are described above. Electronically Signed   By: Monte Fantasia M.D.   On: 03/26/2019 04:29       LOS: 2 days   [facility] Pager on [email or web address]  03/27/2019, 11:41 AM

## 2019-03-27 NOTE — Progress Notes (Addendum)
1900 Report from [facility].  2000 Shift assessment , VS WDL. Pt on room air, condom cath in place. NS infusing at 75 ml/hr. A0X 1 (self) disoriented to time and place. C.DIFF positive .No BM's during day shift. Call bell in reach. Bed alarmed.  2110 PM medications given.   0545 New IV placed in left hand , AM medications placed. Peri care, new condom cath applied.

## 2019-03-28 ENCOUNTER — Encounter (HOSPITAL_COMMUNITY): Payer: Self-pay | Admitting: Internal Medicine

## 2019-03-28 LAB — COMPREHENSIVE METABOLIC PANEL
ALT: 34 U/L (ref 0–44)
AST: 74 U/L — ABNORMAL HIGH (ref 15–41)
Albumin: 2.5 g/dL — ABNORMAL LOW (ref 3.5–5.0)
Alkaline Phosphatase: 42 U/L (ref 38–126)
Anion gap: 9 (ref 5–15)
BUN: 38 mg/dL — ABNORMAL HIGH (ref 8–23)
CO2: 21 mmol/L — ABNORMAL LOW (ref 22–32)
Calcium: 8 mg/dL — ABNORMAL LOW (ref 8.9–10.3)
Chloride: 105 mmol/L (ref 98–111)
Creatinine, Ser: 1.21 mg/dL (ref 0.61–1.24)
GFR calc Af Amer: >60 mL/min (ref >60)
GFR calc non Af Amer: 53 mL/min — ABNORMAL LOW (ref >60)
Glucose, Bld: 78 mg/dL (ref 70–99)
Potassium: 3.8 mmol/L (ref 3.5–5.1)
Sodium: 135 mmol/L (ref 135–145)
Total Bilirubin: 0.6 mg/dL (ref 0.3–1.2)
Total Protein: 5.2 g/dL — ABNORMAL LOW (ref 6.5–8.1)

## 2019-03-28 LAB — RETICULOCYTES
Immature Retic Fract: 17.5 % — ABNORMAL HIGH (ref 2.3–15.9)
RBC.: 2.54 MIL/uL — ABNORMAL LOW (ref 4.22–5.81)
Retic Count, Absolute: 27.2 10*3/uL (ref 19.0–186.0)
Retic Ct Pct: 1.1 % (ref 0.4–3.1)

## 2019-03-28 LAB — CBC WITH DIFFERENTIAL/PLATELET
Abs Immature Granulocytes: 0.05 10*3/uL (ref 0.00–0.07)
Basophils Absolute: 0 10*3/uL (ref 0.0–0.1)
Basophils Relative: 0 %
Eosinophils Absolute: 0 10*3/uL (ref 0.0–0.5)
Eosinophils Relative: 0 %
HCT: 28.4 % — ABNORMAL LOW (ref 39.0–52.0)
Hemoglobin: 9.4 g/dL — ABNORMAL LOW (ref 13.0–17.0)
Immature Granulocytes: 1 %
Lymphocytes Relative: 24 %
Lymphs Abs: 2.4 10*3/uL (ref 0.7–4.0)
MCH: 37.9 pg — ABNORMAL HIGH (ref 26.0–34.0)
MCHC: 33.1 g/dL (ref 30.0–36.0)
MCV: 114.5 fL — ABNORMAL HIGH (ref 80.0–100.0)
Monocytes Absolute: 2 10*3/uL — ABNORMAL HIGH (ref 0.1–1.0)
Monocytes Relative: 20 %
Neutro Abs: 5.7 10*3/uL (ref 1.7–7.7)
Neutrophils Relative %: 55 %
Platelets: 146 10*3/uL — ABNORMAL LOW (ref 150–400)
RBC: 2.48 MIL/uL — ABNORMAL LOW (ref 4.22–5.81)
RDW: 14.6 % (ref 11.5–15.5)
WBC: 10.2 10*3/uL (ref 4.0–10.5)
nRBC: 0 % (ref 0.0–0.2)

## 2019-03-28 LAB — IRON AND TIBC
Iron: 62 ug/dL (ref 45–182)
Saturation Ratios: 39 % (ref 17.9–39.5)
TIBC: 160 ug/dL — ABNORMAL LOW (ref 250–450)
UIBC: 98 ug/dL

## 2019-03-28 LAB — VITAMIN B12: Vitamin B-12: 1131 pg/mL — ABNORMAL HIGH (ref 180–914)

## 2019-03-28 LAB — C-REACTIVE PROTEIN: CRP: 8.9 mg/dL — ABNORMAL HIGH (ref <1.0)

## 2019-03-28 LAB — FERRITIN: Ferritin: 623 ng/mL — ABNORMAL HIGH (ref 24–336)

## 2019-03-28 LAB — FOLATE: Folate: 28.1 ng/mL (ref >5.9)

## 2019-03-28 NOTE — Evaluation (Signed)
Clinical/Bedside Swallow Evaluation Patient Details  Name: Spencer Howard MRN: [id number] Date of Birth: [birth year]  Today's Date: 03/28/2019 Time: SLP Start Time (ACUTE ONLY): 0946 SLP Stop Time (ACUTE ONLY): 0953 SLP Time Calculation (min) (ACUTE ONLY): 7 min  Past Medical History:  Past Medical History:  Diagnosis Date  . Alcohol abuse with alcohol-induced mental disorder ([place])   . Dementia associated with alcoholism ([place])    Alzheimer's dementia versus alcohol related dementia. 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 Depression   . High cholesterol   . Hypertension   . Hypothyroidism (acquired)    Past Surgical History:  Past Surgical History:  Procedure Laterality Date  . ELBOW SURGERY Left 1995   Broken  . NM MYOVIEW LTD  02/2016   Normal LV function - EF 60-65%. No EKG changes. No ischemia or infarction noted on imaging. LOW RISK  . TONSILLECTOMY  1942  . TRANSTHORACIC ECHOCARDIOGRAM  02/2016   Moderate concentric LVH with EF 55-60%. GR 1 DD. Mild LA dilation. Mildly increased PA pressures  . VARICOCELECTOMY  1967   HPI:  Spencer Howard is a 85 y.o. male with medical history significant of dementia associated with prior EtOHism, depression, HTN admitted with AMS. Covid +. CXR No active disease.  Scarring or atelectasis at the left base.   Assessment / Plan / Recommendation Clinical Impression  Pt exhibited functional oropharyngeal swallow in light of + Covid-19. He is on room air, strong vocal qulaity and cough; confused about place and situation but cooperative with strong appearing oral-motor movements. Repetitive straw sips thin and cracker trials did not reveal s/s aspiration or oral residue. Swallow/respiratory system was synchronized. Continue Dys 3, thin and pills whole in applesauce. No follow up needed.  SLP Visit Diagnosis: Dysphagia, unspecified (R13.10)    Aspiration Risk  Mild aspiration risk    Diet Recommendation Dysphagia 3  (Mech soft);Thin liquid   Liquid Administration via: Cup;Straw Medication Administration: Whole meds with puree Supervision: Patient able to self feed;Intermittent supervision to cue for compensatory strategies Compensations: Slow rate;Small sips/bites;Minimize environmental distractions;Lingual sweep for clearance of pocketing Postural Changes: Seated upright at 90 degrees    Other  Recommendations Oral Care Recommendations: Oral care BID   Follow up Recommendations None      Frequency and Duration            Prognosis        Swallow Study   General HPI: Spencer Howard is a 85 y.o. male with medical history significant of dementia associated with prior EtOHism, depression, HTN admitted with AMS. Covid +. CXR No active disease.  Scarring or atelectasis at the left base. Type of Study: Bedside Swallow Evaluation Previous Swallow Assessment: (none) Diet Prior to this Study: Dysphagia 3 (soft);Thin liquids Temperature Spikes Noted: No Respiratory Status: Room air History of Recent Intubation: No Behavior/Cognition: Alert;Cooperative;Confused;Requires cueing Oral Cavity Assessment: Within Functional Limits Oral Care Completed by SLP: No Oral Cavity - Dentition: Adequate natural dentition Vision: Functional for self-feeding Self-Feeding Abilities: Able to feed self Patient Positioning: Upright in bed Baseline Vocal Quality: Normal Volitional Cough: Strong Volitional Swallow: Able to elicit    Oral/Motor/Sensory Function Overall Oral Motor/Sensory Function: Within functional limits   Ice Chips Ice chips: Not tested   Thin Liquid Thin Liquid: Within functional limits Presentation: Cup;Straw    Nectar Thick Nectar Thick Liquid: Not tested   Honey Thick Honey Thick Liquid: Not tested   Puree Puree: Not tested   Solid  Solid: Within functional limits      Spencer Howard 03/28/2019,10:04 AM  Spencer Howard Risk analyst  [phone] Office [phone]

## 2019-03-28 NOTE — Progress Notes (Signed)
PROGRESS NOTE  Spencer Howard [id number] DOB: May 15, 1928 DOA: 03/25/2019  PCP: Haywood Pao, MD  Brief History/Interval Summary: 85 y.o. male with medical history significant of dementia associated with prior EtOHism, depression, HTN.  Patient lives in a nursing facility.  Pt sent in to ED with AMS.  There was some concern for syncopal episode as well.  There was also apparently some concern for strokelike symptoms.  He was also experiencing diarrhea at the nursing facility.  CT head did not show any acute findings.  Stool tested positive for C. difficile.  He was also positive for COVID-19.  He was hospitalized for further management.  Reason for Visit: C. difficile colitis.  COVID-19 infection  Consultants: None  Procedures: None  Antibiotics: Anti-infectives (From admission, onward)   Start     Dose/Rate Route Frequency Ordered Stop   03/27/19 1000  remdesivir 100 mg in sodium chloride 0.9 % 100 mL IVPB     100 mg 200 mL/hr over 30 Minutes Intravenous Daily 03/26/19 0255 03/31/19 0959   03/26/19 0400  remdesivir 200 mg in sodium chloride 0.9% 250 mL IVPB     200 mg 580 mL/hr over 30 Minutes Intravenous Once 03/26/19 0255 03/26/19 0519   03/26/19 0030  vancomycin (VANCOCIN) 50 mg/mL oral solution 125 mg     125 mg Oral 4 times daily 03/26/19 0019 04/04/19 2159   03/25/19 2030  cefTRIAXone (ROCEPHIN) 1 g in sodium chloride 0.9 % 100 mL IVPB  Status:  Discontinued     1 g 200 mL/hr over 30 Minutes Intravenous Every 24 hours 03/25/19 2014 03/25/19 2326      Subjective/Interval History: Patient remains pleasantly confused.  Denies any pain.  No issues overnight.    Assessment/Plan:  Acute Hypoxic Resp. Failure/Pneumonia due to COVID-19    Recent Labs  Lab 03/25/19 1833 03/26/19 0257 03/27/19 0257 03/28/19 0051  DDIMER  --  1.67*  --   --   FERRITIN  --   --   --  623*  CRP  --  11.1* 10.0* 8.9*  ALT 34 35 34 34  PROCALCITON  --  31.79  --   --      Objective findings: Fever: Afebrile Oxygen requirements: Saturating normal on room air.  COVID 19 Therapeutics: Antibacterials: Oral vancomycin Remdesivir: Day 3 Steroids: None Diuretics: None Actemra: Not given Convalescent Plasma: Not given DVT Prophylaxis: Subcutaneous heparin  Patient was initially hypoxic requiring 2 to 3 L of oxygen.  He was subsequently weaned off of it.  Chest x-ray did not show any active disease but clinically he was thought to have pneumonia and so he was started on remdesivir.  Today will be day 3.  Seems to be stable from a respiratory standpoint.  CRP gradually improving.  ALT is normal.  Ferritin 623.  Elevated CRP could also be due to C. difficile infection.  Procalcitonin was significantly elevated at 31.79.  We will recheck tomorrow.    C. difficile colitis with sepsis Stool study positive for C. difficile.  Patient has been having multiple episodes of diarrhea.  WBC was significantly elevated.  Procalcitonin significantly elevated.  Is on oral vancomycin.  WBC is normal today.  Diarrhea appears to be subsiding.  Continue to monitor.    Hematuria UA shows large hemoglobin with more than 50 RBCs.  No evidence for infection.  Could have been traumatic due to catheter insertion done for obtaining a urine sample.  CT renal studies did not show any acute  changes.  Continue to monitor for now.  Acute metabolic encephalopathy This is in the setting of known dementia.  He could be close to his baseline currently.  Does not have any neurological deficits.  Acute kidney injury on chronic kidney disease stage III Creatinine 1.23 and July 2019.  Creatinine was 2.6 on presentation.  He was hydrated.  Improved to 1.21 today.  Cut back on IV fluids.  Encourage oral intake.  Monitor urine output.   Essential hypertension Monitor blood pressures closely. Not on any antihypertensives currently.  Macrocytic anemia Drop in hemoglobin is likely dilutional.  No overt  bleeding noted except for hematuria.  Anemia panel reviewed.  Ferritin 623.  B12 and folate levels normal.  No clear evidence of iron deficiency.    History of dementia Continue with his home medications.  Other incidental findings on CT renal study Abnormal asymmetric thickening of the right obturators interna with pelvic fat edema noted.  Patient however does not have any pain in that area.  No further work-up at this time.  Try to mobilize.  DVT Prophylaxis: Subcutaneous heparin Code Status: DNR Family Communication: Wife is being updated daily. Disposition Plan: Hopefully return to skilled nursing facility when improved.   Medications:  Scheduled: . divalproex  125 mg Oral BID  . donepezil  10 mg Oral Daily  . heparin  5,000 Units Subcutaneous Q8H  . levothyroxine  75 mcg Oral Q0600  . memantine  5 mg Oral BID  . multivitamin with minerals  1 tablet Oral Q breakfast  . vancomycin  125 mg Oral QID   Continuous: . sodium chloride 75 mL/hr at 03/26/19 2100  . remdesivir 100 mg in NS 100 mL 100 mg (03/27/19 0927)   [id number] **OR** acetaminophen, lip balm, ondansetron **OR** ondansetron (ZOFRAN) IV   Objective:  Vital Signs  Vitals:   03/27/19 0356 03/27/19 2114 03/28/19 0400 03/28/19 0726  BP: 129/60 (!) 150/72 121/60 (!) 146/65  Pulse: 87 75 67 73  Resp: 19 15 12 14   Temp: 98.1 F (36.7 C) 98 F (36.7 C) 98.2 F (36.8 C) 98.2 F (36.8 C)  TempSrc: Oral Oral Oral Oral  SpO2: 97% 98% 95% 98%  Weight:      Height:        Intake/Output Summary (Last 24 hours) at 03/28/2019 1125 Last data filed at 03/28/2019 1000 Gross per 24 hour  Intake 120 ml  Output 700 ml  Net -580 ml   Filed Weights   03/25/19 2108  Weight: 86.2 kg    General appearance: Pleasantly confused.  In no distress. Resp: Few crackles at the bases.  No wheezing or rhonchi.  Normal effort at rest. Cardio: S1-S2 is normal regular.  No S3-S4.  No rubs murmurs or bruit GI: Abdomen is  soft.  Nontender nondistended.  Bowel sounds are present normal.  No masses organomegaly Extremities: No edema.   Neurologic: Disoriented.  No obvious focal neurological deficits.  Lab Results:  Data Reviewed: I have personally reviewed following labs and imaging studies  CBC: Recent Labs  Lab 03/25/19 1833 03/26/19 0257 03/27/19 0257 03/28/19 0051  WBC 19.3* 30.3* 18.5* 10.2  NEUTROABS 11.1*  --  15.9* 5.7  HGB 13.4 12.6* 9.7* 9.4*  HCT 40.4 37.3* 28.9* 28.4*  MCV 115.4* 113.4* 114.7* 114.5*  PLT 151 151 144* 146*    Basic Metabolic Panel: Recent Labs  Lab 03/25/19 1833 03/26/19 0257 03/27/19 0257 03/28/19 0051  NA 138 141 134* 135  K 4.3 4.4  4.1 3.8  CL 102 105 105 105  CO2 22 24 20* 21*  GLUCOSE 154* 120* 74 78  BUN 31* 39* 47* 38*  CREATININE 2.60* 2.70* 1.79* 1.21  CALCIUM 9.2 8.7* 7.8* 8.0*  MG  --   --  1.9  --     GFR: Estimated Creatinine Clearance: 44.9 mL/min (by C-G formula based on SCr of 1.21 mg/dL).  Liver Function Tests: Recent Labs  Lab 03/25/19 1833 03/26/19 0257 03/27/19 0257 03/28/19 0051  AST 80* 88* 81* 74*  ALT 34 35 34 34  ALKPHOS 45 40 37* 42  BILITOT 0.9 0.4 0.9 0.6  PROT 6.7 6.1* 5.3* 5.2*  ALBUMIN 3.2* 2.9* 2.5* 2.5*     Coagulation Profile: Recent Labs  Lab 03/25/19 1833  INR 1.1     CBG: Recent Labs  Lab 03/25/19 1824  GLUCAP 147*      Recent Results (from the past 240 hour(s))  Blood Culture (routine x 2)     Status: None (Preliminary result)   Collection Time: 03/25/19  4:45 AM   Specimen: BLOOD RIGHT FOREARM  Result Value Ref Range Status   Specimen Description BLOOD RIGHT FOREARM  Final   Special Requests   Final    BOTTLES DRAWN AEROBIC AND ANAEROBIC Blood Culture adequate volume   Culture   Final    NO GROWTH 2 DAYS Performed at [facility], [place] [address] [place], [place] [address]    Report Status PENDING  Incomplete  Blood Culture (routine x 2)     Status: None (Preliminary result)    Collection Time: 03/25/19  6:33 PM   Specimen: BLOOD  Result Value Ref Range Status   Specimen Description BLOOD RIGHT ANTECUBITAL  Final   Special Requests   Final    BOTTLES DRAWN AEROBIC AND ANAEROBIC Blood Culture results may not be optimal due to an inadequate volume of blood received in culture bottles   Culture   Final    NO GROWTH 3 DAYS Performed at [facility], [place] [address] [place], [place] [address]    Report Status PENDING  Incomplete  SARS CORONAVIRUS 2 (TAT 6-24 HRS) Nasopharyngeal Nasopharyngeal Swab     Status: Abnormal   Collection Time: 03/25/19  7:51 PM   Specimen: Nasopharyngeal Swab  Result Value Ref Range Status   SARS Coronavirus 2 POSITIVE (A) NEGATIVE Final    Comment: RESULT CALLED TO, READ BACK BY AND VERIFIED WITH: MONIQUE DAVIS [email or web address]  ON 12.29.2020 BY TCALDWELL MT. (NOTE) SARS-CoV-2 target nucleic acids are DETECTED. The SARS-CoV-2 RNA is generally detectable in upper and lower respiratory specimens during the acute phase of infection. Positive results are indicative of the presence of SARS-CoV-2 RNA. Clinical correlation with patient history and other diagnostic information is  necessary to determine patient infection status. Positive results do not rule out bacterial infection or co-infection with other viruses.  The expected result is Negative. Fact Sheet for Patients: SugarRoll.be Fact Sheet for Healthcare Providers: https://www.woods-mathews.com/ This test is not yet approved or cleared by the [place] FDA and  has been authorized for detection and/or diagnosis of SARS-CoV-2 by FDA under an Emergency Use Authorization (EUA). This EUA will remain  in effect (meaning this test ca n be used) for the duration of the COVID-19 declaration under Section 564(b)(1) of the Act, 21 U.S.C. section 360bbb-3(b)(1), unless the authorization is terminated or revoked sooner. Performed at [facility], [place] [address] [place], [place] [address]  Urine culture     Status: Abnormal   Collection Time: 03/25/19  9:07 PM   Specimen: In/Out Cath Urine  Result Value Ref Range Status   Specimen Description IN/OUT CATH URINE  Final   Special Requests   Final    NONE Performed at [facility], [address], [place], [place] [address]    Culture MULTIPLE SPECIES PRESENT, SUGGEST RECOLLECTION (A)  Final   Report Status 03/26/2019 FINAL  Final  C difficile quick scan w PCR reflex     Status: Abnormal   Collection Time: 03/25/19 10:58 PM   Specimen: STOOL  Result Value Ref Range Status   C Diff antigen POSITIVE (A) NEGATIVE Final   C Diff toxin NEGATIVE NEGATIVE Final   C Diff interpretation Results are indeterminate. See PCR results.  Final    Comment: Performed at [facility], [place] [address], [place], [place] [address]  C. Diff by PCR, Reflexed     Status: Abnormal   Collection Time: 03/25/19 10:58 PM  Result Value Ref Range Status   Toxigenic C. Difficile by PCR POSITIVE (A) NEGATIVE Final    Comment: Positive for toxigenic C. difficile with little to no toxin production. Only treat if clinical presentation suggests symptomatic illness. Performed at [facility], [place] [address], [place], [place] [address]       Radiology Studies: No results found.     LOS: 3 days   Docia Klar [employer] on [email or web address]  03/28/2019, 11:25 AM

## 2019-03-29 LAB — COMPREHENSIVE METABOLIC PANEL
ALT: 30 U/L (ref 0–44)
AST: 60 U/L — ABNORMAL HIGH (ref 15–41)
Albumin: 2.3 g/dL — ABNORMAL LOW (ref 3.5–5.0)
Alkaline Phosphatase: 32 U/L — ABNORMAL LOW (ref 38–126)
Anion gap: 7 (ref 5–15)
BUN: 24 mg/dL — ABNORMAL HIGH (ref 8–23)
CO2: 25 mmol/L (ref 22–32)
Calcium: 8.1 mg/dL — ABNORMAL LOW (ref 8.9–10.3)
Chloride: 108 mmol/L (ref 98–111)
Creatinine, Ser: 0.98 mg/dL (ref 0.61–1.24)
GFR calc Af Amer: >60 mL/min (ref >60)
GFR calc non Af Amer: >60 mL/min (ref >60)
Glucose, Bld: 90 mg/dL (ref 70–99)
Potassium: 3.1 mmol/L — ABNORMAL LOW (ref 3.5–5.1)
Sodium: 140 mmol/L (ref 135–145)
Total Bilirubin: 0.7 mg/dL (ref 0.3–1.2)
Total Protein: 4.7 g/dL — ABNORMAL LOW (ref 6.5–8.1)

## 2019-03-29 LAB — CBC WITH DIFFERENTIAL/PLATELET
Abs Immature Granulocytes: 0.03 10*3/uL (ref 0.00–0.07)
Basophils Absolute: 0 10*3/uL (ref 0.0–0.1)
Basophils Relative: 0 %
Eosinophils Absolute: 0 10*3/uL (ref 0.0–0.5)
Eosinophils Relative: 0 %
HCT: 27.3 % — ABNORMAL LOW (ref 39.0–52.0)
Hemoglobin: 8.9 g/dL — ABNORMAL LOW (ref 13.0–17.0)
Immature Granulocytes: 1 %
Lymphocytes Relative: 34 %
Lymphs Abs: 1.8 10*3/uL (ref 0.7–4.0)
MCH: 36.8 pg — ABNORMAL HIGH (ref 26.0–34.0)
MCHC: 32.6 g/dL (ref 30.0–36.0)
MCV: 112.8 fL — ABNORMAL HIGH (ref 80.0–100.0)
Monocytes Absolute: 1.3 10*3/uL — ABNORMAL HIGH (ref 0.1–1.0)
Monocytes Relative: 24 %
Neutro Abs: 2.2 10*3/uL (ref 1.7–7.7)
Neutrophils Relative %: 41 %
Platelets: 156 10*3/uL (ref 150–400)
RBC: 2.42 MIL/uL — ABNORMAL LOW (ref 4.22–5.81)
RDW: 14.2 % (ref 11.5–15.5)
WBC: 5.2 10*3/uL (ref 4.0–10.5)
nRBC: 0 % (ref 0.0–0.2)

## 2019-03-29 LAB — C-REACTIVE PROTEIN: CRP: 4.3 mg/dL — ABNORMAL HIGH (ref <1.0)

## 2019-03-29 LAB — PROCALCITONIN: Procalcitonin: 3.83 ng/mL

## 2019-03-29 MED ORDER — POTASSIUM CHLORIDE CRYS ER 20 MEQ PO TBCR
40.0000 meq | EXTENDED_RELEASE_TABLET | Freq: Once | ORAL | Status: AC
  Administered 2019-03-29: 40 meq via ORAL
  Filled 2019-03-29: qty 2

## 2019-03-29 MED ORDER — MAGNESIUM SULFATE 2 GM/50ML IV SOLN
2.0000 g | Freq: Once | INTRAVENOUS | Status: AC
  Administered 2019-03-29: 2 g via INTRAVENOUS
  Filled 2019-03-29: qty 50

## 2019-03-29 MED ORDER — POTASSIUM CHLORIDE 2 MEQ/ML IV SOLN
INTRAVENOUS | Status: DC
  Filled 2019-03-29: qty 1000

## 2019-03-29 NOTE — Progress Notes (Signed)
PROGRESS NOTE                                                                                                                                                                                                             Patient Demographics:    Spencer Howard, is a 86 y.o. male, DOB - 1928-08-19, JPV:[id number]  Outpatient Primary MD for the patient is Tisovec, Fransico Him, MD    LOS - 4  Admit date - 03/25/2019    Chief Complaint  Patient presents with   Transient Ischemic Attack       Brief Narrative -  86 y.o.malewith medical history significant ofdementia associated with prior EtOHism, depression, HTN. Patient lives in a nursing facility. Pt sent in to ED with AMS.  There was some concern for syncopal episode as well.  There was also apparently some concern for strokelike symptoms.  He was also experiencing diarrhea at the nursing facility.  CT head did not show any acute findings.  Stool tested positive for C. difficile.  He was also positive for COVID-19.  He was hospitalized for further management.   Subjective:    Spencer Howard today has, No headache, No chest pain, No abdominal pain - No Nausea, No new weakness tingling or numbness, no Cough - SOB.     Assessment  & Plan :     1.   Acute Covid 19 Viral Pneumonitis during the ongoing 2020 Covid 19 Pandemic - he has mild pulmonary disease with no pulmonary symptoms or hypoxia, finished remdesivir course, tapered off steroids.  Encouraged the patient to sit up in chair in the daytime use I-S and flutter valve for pulmonary toiletry and then prone in bed when at night.    Recent Labs  Lab 03/25/19 1951 03/26/19 0257 03/27/19 0257 03/28/19 0051 03/29/19 0050  CRP  --  11.1* 10.0* 8.9* 4.3*  DDIMER  --  1.67*  --   --   --   FERRITIN  --   --   --  623*  --   PROCALCITON  --  31.79  --   --  3.83  SARSCOV2NAA POSITIVE*  --   --   --   --     Hepatic  Function Latest Ref Rng & Units 03/29/2019 03/28/2019 03/27/2019  Total Protein 6.5 - 8.1 g/dL 4.7(L) 5.2(L)  5.3(L)  Albumin 3.5 - 5.0 g/dL 2.3(L) 2.5(L) 2.5(L)  AST 15 - 41 U/L 60(H) 74(H) 81(H)  ALT 0 - 44 U/L 30 34 34  Alk Phosphatase 38 - 126 U/L 32(L) 42 37(L)  Total Bilirubin 0.3 - 1.2 mg/dL 0.7 0.6 0.9      2.  C. difficile diarrhea with sepsis.  Improving with oral vancomycin.  Sepsis pathophysiology has resolved.  3.  Dehydration and hypokalemia.  IV fluids and replace potassium oral and IV.  4.  Dementia.  Supportive care.  5.  Hematuria with AKI.  AKI resolved with hydration.  CT scan abdomen did not show any stones, outpatient follow-up with urologist post discharge.  6.  Macrocytic acute on chronic anemia.  Likely due to heme dilution, patient anemia work-up as age-appropriate.  7.  Incidental findings of CT scan. Asymmetric thickening of the right obturators interna with pelvic fat edema noted, PT and ambulate, at rest asymptomatic, will monitor closely if needed hip MRI will be done outpatient.    Condition - Extremely Guarded  Family Communication  :  None  Code Status :  DNR  Diet :   Diet Order            DIET DYS 3 Room service appropriate? Yes; Fluid consistency: Thin  Diet effective now               Disposition Plan  :  TBD  Consults  :  None  Procedures  :    PUD Prophylaxis : None  DVT Prophylaxis  :   Heparin   Lab Results  Component Value Date   PLT 156 03/29/2019    Inpatient Medications  Scheduled Meds:  divalproex  125 mg Oral BID   donepezil  10 mg Oral Daily   heparin  5,000 Units Subcutaneous Q8H   levothyroxine  75 mcg Oral Q0600   memantine  5 mg Oral BID   multivitamin with minerals  1 tablet Oral Q breakfast   potassium chloride  40 mEq Oral Once   vancomycin  125 mg Oral QID   Continuous Infusions:  lactated ringers with kcl     remdesivir 100 mg in NS 100 mL 100 mg (03/29/19 0923)   PRN  Meds:.acetaminophen **OR** acetaminophen, lip balm, ondansetron **OR** ondansetron (ZOFRAN) IV  Antibiotics  :    Anti-infectives (From admission, onward)   Start     Dose/Rate Route Frequency Ordered Stop   03/27/19 1000  remdesivir 100 mg in sodium chloride 0.9 % 100 mL IVPB     100 mg 200 mL/hr over 30 Minutes Intravenous Daily 03/26/19 0255 03/31/19 0959   03/26/19 0400  remdesivir 200 mg in sodium chloride 0.9% 250 mL IVPB     200 mg 580 mL/hr over 30 Minutes Intravenous Once 03/26/19 0255 03/26/19 0519   03/26/19 0030  vancomycin (VANCOCIN) 50 mg/mL oral solution 125 mg     125 mg Oral 4 times daily 03/26/19 0019 04/04/19 2159   03/25/19 2030  cefTRIAXone (ROCEPHIN) 1 g in sodium chloride 0.9 % 100 mL IVPB  Status:  Discontinued     1 g 200 mL/hr over 30 Minutes Intravenous Every 24 hours 03/25/19 2014 03/25/19 2326       Time Spent in minutes  30   Lala Lund M.D on 03/29/2019 at 10:59 AM  To page go to [email or web address] - password [email or web address]  [facility] -  Office  [phone]    See all Orders from today for  further details    Objective:   Vitals:   03/28/19 2100 03/29/19 0355 03/29/19 0530 03/29/19 0728  BP: (!) 157/82 (!) 146/73 (!) 131/59 117/62  Pulse: 81 77 75 67  Resp: '19 13 15 11  '$ Temp: 98.5 F (36.9 C)  98.4 F (36.9 C) 97.7 F (36.5 C)  TempSrc: Oral  Oral Oral  SpO2: 99% 97% 97% 99%  Weight:      Height:        Wt Readings from Last 3 Encounters:  03/25/19 86.2 kg  09/18/17 82.6 kg  08/23/17 88.8 kg     Intake/Output Summary (Last 24 hours) at 03/29/2019 1059 Last data filed at 03/29/2019 0600 Gross per 24 hour  Intake 480 ml  Output 1500 ml  Net -1020 ml     Physical Exam  Awake but pleasantly confused, No new F.N deficits,   [place].AT,PERRAL Supple Neck,No JVD, No cervical lymphadenopathy appriciated.  Symmetrical Chest wall movement, Good air movement bilaterally, CTAB RRR,No Gallops,Rubs or new Murmurs, No Parasternal Heave +ve  B.Sounds, Abd Soft, No tenderness, No organomegaly appriciated, No rebound - guarding or rigidity. No Cyanosis, Clubbing or edema, No new Rash or bruise      Data Review:    CBC Recent Labs  Lab 03/25/19 1833 03/26/19 0257 03/27/19 0257 03/28/19 0051 03/29/19 0050  WBC 19.3* 30.3* 18.5* 10.2 5.2  HGB 13.4 12.6* 9.7* 9.4* 8.9*  HCT 40.4 37.3* 28.9* 28.4* 27.3*  PLT 151 151 144* 146* 156  MCV 115.4* 113.4* 114.7* 114.5* 112.8*  MCH 38.3* 38.3* 38.5* 37.9* 36.8*  MCHC 33.2 33.8 33.6 33.1 32.6  RDW 14.7 14.9 15.0 14.6 14.2  LYMPHSABS 1.8  --  1.1 2.4 1.8  MONOABS 6.1*  --  1.5* 2.0* 1.3*  EOSABS 0.1  --  0.0 0.0 0.0  BASOSABS 0.0  --  0.0 0.0 0.0    Chemistries  Recent Labs  Lab 03/25/19 1833 03/26/19 0257 03/27/19 0257 03/28/19 0051 03/29/19 0050  NA 138 141 134* 135 140  K 4.3 4.4 4.1 3.8 3.1*  CL 102 105 105 105 108  CO2 22 24 20* 21* 25  GLUCOSE 154* 120* 74 78 90  BUN 31* 39* 47* 38* 24*  CREATININE 2.60* 2.70* 1.79* 1.21 0.98  CALCIUM 9.2 8.7* 7.8* 8.0* 8.1*  MG  --   --  1.9  --   --   AST 80* 88* 81* 74* 60*  ALT 34 35 34 34 30  ALKPHOS 45 40 37* 42 32*  BILITOT 0.9 0.4 0.9 0.6 0.7   ------------------------------------------------------------------------------------------------------------------ No results for input(s): CHOL, HDL, LDLCALC, TRIG, CHOLHDL, LDLDIRECT in the last 72 hours.  No results found for: HGBA1C ------------------------------------------------------------------------------------------------------------------ No results for input(s): TSH, T4TOTAL, T3FREE, THYROIDAB in the last 72 hours.  Invalid input(s): FREET3  Cardiac Enzymes No results for input(s): CKMB, TROPONINI, MYOGLOBIN in the last 168 hours.  Invalid input(s): CK ------------------------------------------------------------------------------------------------------------------ No results found for: BNP  Micro Results Recent Results (from the past 240 hour(s))  Blood  Culture (routine x 2)     Status: None (Preliminary result)   Collection Time: 03/25/19  4:45 AM   Specimen: BLOOD RIGHT FOREARM  Result Value Ref Range Status   Specimen Description BLOOD RIGHT FOREARM  Final   Special Requests   Final    BOTTLES DRAWN AEROBIC AND ANAEROBIC Blood Culture adequate volume   Culture   Final    NO GROWTH 2 DAYS Performed at [facility], [address] [place], [place] [address]    Report Status PENDING  Incomplete  Blood Culture (routine x 2)     Status: None (Preliminary result)   Collection Time: 03/25/19  6:33 PM   Specimen: BLOOD  Result Value Ref Range Status   Specimen Description BLOOD RIGHT ANTECUBITAL  Final   Special Requests   Final    BOTTLES DRAWN AEROBIC AND ANAEROBIC Blood Culture results may not be optimal due to an inadequate volume of blood received in culture bottles   Culture   Final    NO GROWTH 3 DAYS Performed at [facility], [place] [address], [place], [place] [address]    Report Status PENDING  Incomplete  SARS CORONAVIRUS 2 (TAT 6-24 HRS) Nasopharyngeal Nasopharyngeal Swab     Status: Abnormal   Collection Time: 03/25/19  7:51 PM   Specimen: Nasopharyngeal Swab  Result Value Ref Range Status   SARS Coronavirus 2 POSITIVE (A) NEGATIVE Final    Comment: RESULT CALLED TO, READ BACK BY AND VERIFIED WITH: MONIQUE DAVIS RN.'@[id number]'$  ON 12.29.2020 BY TCALDWELL MT. (NOTE) SARS-CoV-2 target nucleic acids are DETECTED. The SARS-CoV-2 RNA is generally detectable in upper and lower respiratory specimens during the acute phase of infection. Positive results are indicative of the presence of SARS-CoV-2 RNA. Clinical correlation with patient history and other diagnostic information is  necessary to determine patient infection status. Positive results do not rule out bacterial infection or co-infection with other viruses.  The expected result is Negative. Fact Sheet for Patients: SugarRoll.be Fact  Sheet for Healthcare Providers: https://www.woods-mathews.com/ This test is not yet approved or cleared by the [place] FDA and  has been authorized for detection and/or diagnosis of SARS-CoV-2 by FDA under an Emergency Use Authorization (EUA). This EUA will remain  in effect (meaning this test ca n be used) for the duration of the COVID-19 declaration under Section 564(b)(1) of the Act, 21 U.S.C. section 360bbb-3(b)(1), unless the authorization is terminated or revoked sooner. Performed at [facility], [place] [address], [place], [place] [address]   Urine culture     Status: Abnormal   Collection Time: 03/25/19  9:07 PM   Specimen: In/Out Cath Urine  Result Value Ref Range Status   Specimen Description IN/OUT CATH URINE  Final   Special Requests   Final    NONE Performed at [facility], [place] [address], [place], [place] [address]    Culture MULTIPLE SPECIES PRESENT, SUGGEST RECOLLECTION (A)  Final   Report Status 03/26/2019 FINAL  Final  C difficile quick scan w PCR reflex     Status: Abnormal   Collection Time: 03/25/19 10:58 PM   Specimen: STOOL  Result Value Ref Range Status   C Diff antigen POSITIVE (A) NEGATIVE Final   C Diff toxin NEGATIVE NEGATIVE Final   C Diff interpretation Results are indeterminate. See PCR results.  Final    Comment: Performed at [facility], [place] [address] [place], [place] [address]  C. Diff by PCR, Reflexed     Status: Abnormal   Collection Time: 03/25/19 10:58 PM  Result Value Ref Range Status   Toxigenic C. Difficile by PCR POSITIVE (A) NEGATIVE Final    Comment: Positive for toxigenic C. difficile with little to no toxin production. Only treat if clinical presentation suggests symptomatic illness. Performed at [facility], [place] [address], [place], [place] [address]     Radiology Reports CT HEAD WO CONTRAST  Result Date: 03/25/2019 CLINICAL  DATA:  Altered mental status EXAM: CT HEAD WITHOUT CONTRAST  TECHNIQUE: Contiguous axial images were obtained from the base of the skull through the vertex without intravenous contrast. COMPARISON:  MRI 10/25/2017, CT brain 10/25/2017 FINDINGS: Brain: No acute territorial infarction, hemorrhage, or intracranial mass. Atrophy and moderate hypodensity in the white matter consistent with chronic small vessel ischemic change. Stable enlarged ventricles. Vascular: No hyperdense vessels. Carotid vascular and vertebral artery calcification. Skull: Normal. Negative for fracture or focal lesion. Sinuses/Orbits: Mild mucosal thickening in the sinuses Other: None IMPRESSION: 1. No CT evidence for acute intracranial abnormality. 2. Atrophy and chronic small vessel ischemic changes of the white matter Electronically Signed   By: Donavan Foil M.D.   On: 03/25/2019 23:14   DG Chest Port 1 View  Result Date: 03/25/2019 CLINICAL DATA:  Fever EXAM: PORTABLE CHEST 1 VIEW COMPARISON:  07/13/2017 FINDINGS: No focal airspace disease or effusion. Atelectasis or scar at the left base. Normal heart size. Aortic atherosclerosis. No pneumothorax. Non inclusion of the right CP angle. IMPRESSION: No active disease.  Scarring or atelectasis at the left base. Electronically Signed   By: Donavan Foil M.D.   On: 03/25/2019 19:49   CT Renal Stone Study  Result Date: 03/26/2019 CLINICAL DATA:  Hematuria from unknown cause. Loss of appetite fever and diarrhea. COVID-19 positive EXAM: CT ABDOMEN AND PELVIS WITHOUT CONTRAST TECHNIQUE: Multidetector CT imaging of the abdomen and pelvis was performed following the standard protocol without IV contrast. COMPARISON:  03/09/2017 FINDINGS: Lower chest: Volume loss and subpleural reticular density in the left lower lobe which has a scar-like or atelectatic appearance. Coronary calcification. Hepatobiliary: No focal liver abnormality.No evidence of biliary obstruction or stone. Pancreas: Generalized atrophy Spleen: Unremarkable. Adrenals/Urinary Tract:  Negative adrenals. No hydronephrosis or stone. Chronic bladder wall thickening with diverticula/cellule suggesting chronic outlet obstruction. Stomach/Bowel: Mild stranding around the rectum without underlying wall thickening. Sigmoid diverticulosis without active inflammation. No bowel obstruction or appendicitis. Vascular/Lymphatic: Multifocal atherosclerotic calcification with implied high-grade bilateral proximal common iliac stenosis due to the degree of bulky calcified plaque. No mass or adenopathy. Reproductive:Negative Other: No ascites or pneumoperitoneum. Musculoskeletal: Unusual generalized asymmetric thickening of the obturators internus on the right. No underlying fracture or hip joint effusion is seen. There is spinal degeneration with L4-5 anterolisthesis and high-grade lower lumbar spinal stenosis. Remote T11 compression fracture with advanced height loss IMPRESSION: 1. No specific explanation for hematuria. There are changes of chronic bladder outlet obstruction with only mild bladder distension today. 2. Abnormal asymmetric thickening of the right obturators interna with pelvic fat edema, question right hip pain and trauma. Pelvis MRI would be the most definitive study for workup. 3. Chronic findings are described above. Electronically Signed   By: Monte Fantasia M.D.   On: 03/26/2019 04:29

## 2019-03-29 NOTE — Progress Notes (Signed)
OT Note  OT eval completed. Pt to return to facility, Full note to follow.   Maurie Boettcher, OT/L   Acute OT Clinical Specialist Acute Rehabilitation Services Pager [phone] Office [phone]

## 2019-03-30 ENCOUNTER — Encounter (HOSPITAL_COMMUNITY): Payer: Self-pay | Admitting: Internal Medicine

## 2019-03-30 LAB — CBC WITH DIFFERENTIAL/PLATELET
Abs Immature Granulocytes: 0.04 10*3/uL (ref 0.00–0.07)
Basophils Absolute: 0 10*3/uL (ref 0.0–0.1)
Basophils Relative: 0 %
Eosinophils Absolute: 0 10*3/uL (ref 0.0–0.5)
Eosinophils Relative: 1 %
HCT: 28.3 % — ABNORMAL LOW (ref 39.0–52.0)
Hemoglobin: 9.2 g/dL — ABNORMAL LOW (ref 13.0–17.0)
Immature Granulocytes: 1 %
Lymphocytes Relative: 34 %
Lymphs Abs: 1.9 10*3/uL (ref 0.7–4.0)
MCH: 37.1 pg — ABNORMAL HIGH (ref 26.0–34.0)
MCHC: 32.5 g/dL (ref 30.0–36.0)
MCV: 114.1 fL — ABNORMAL HIGH (ref 80.0–100.0)
Monocytes Absolute: 1.3 10*3/uL — ABNORMAL HIGH (ref 0.1–1.0)
Monocytes Relative: 24 %
Neutro Abs: 2.3 10*3/uL (ref 1.7–7.7)
Neutrophils Relative %: 40 %
Platelets: 185 10*3/uL (ref 150–400)
RBC: 2.48 MIL/uL — ABNORMAL LOW (ref 4.22–5.81)
RDW: 14.1 % (ref 11.5–15.5)
WBC: 5.6 10*3/uL (ref 4.0–10.5)
nRBC: 0 % (ref 0.0–0.2)

## 2019-03-30 LAB — COMPREHENSIVE METABOLIC PANEL
ALT: 28 U/L (ref 0–44)
AST: 49 U/L — ABNORMAL HIGH (ref 15–41)
Albumin: 2.3 g/dL — ABNORMAL LOW (ref 3.5–5.0)
Alkaline Phosphatase: 36 U/L — ABNORMAL LOW (ref 38–126)
Anion gap: 7 (ref 5–15)
BUN: 18 mg/dL (ref 8–23)
CO2: 26 mmol/L (ref 22–32)
Calcium: 8 mg/dL — ABNORMAL LOW (ref 8.9–10.3)
Chloride: 105 mmol/L (ref 98–111)
Creatinine, Ser: 0.86 mg/dL (ref 0.61–1.24)
GFR calc Af Amer: >60 mL/min (ref >60)
GFR calc non Af Amer: >60 mL/min (ref >60)
Glucose, Bld: 93 mg/dL (ref 70–99)
Potassium: 3.9 mmol/L (ref 3.5–5.1)
Sodium: 138 mmol/L (ref 135–145)
Total Bilirubin: 1 mg/dL (ref 0.3–1.2)
Total Protein: 4.7 g/dL — ABNORMAL LOW (ref 6.5–8.1)

## 2019-03-30 LAB — CULTURE, BLOOD (ROUTINE X 2): Culture: NO GROWTH

## 2019-03-30 LAB — MAGNESIUM: Magnesium: 1.9 mg/dL (ref 1.7–2.4)

## 2019-03-30 LAB — C-REACTIVE PROTEIN: CRP: 3 mg/dL — ABNORMAL HIGH (ref <1.0)

## 2019-03-30 LAB — PROCALCITONIN: Procalcitonin: 1.53 ng/mL

## 2019-03-30 MED ORDER — POTASSIUM CHLORIDE CRYS ER 20 MEQ PO TBCR
40.0000 meq | EXTENDED_RELEASE_TABLET | Freq: Once | ORAL | Status: AC
  Administered 2019-03-30: 40 meq via ORAL
  Filled 2019-03-30: qty 2

## 2019-03-30 NOTE — Progress Notes (Signed)
PROGRESS NOTE                                                                                                                                                                                                             Patient Demographics:    Spencer Howard, is a 86 y.o. male, DOB - 1929-01-05, FYB:[id number]  Outpatient Primary MD for the patient is Tisovec, Fransico Him, MD    LOS - 5  Admit date - 03/25/2019    Chief Complaint  Patient presents with   Transient Ischemic Attack       Brief Narrative -  86 y.o.malewith medical history significant ofdementia associated with prior EtOHism, depression, HTN. Patient lives in a nursing facility. Pt sent in to ED with AMS.  There was some concern for syncopal episode as well.  There was also apparently some concern for strokelike symptoms.  He was also experiencing diarrhea at the nursing facility.  CT head did not show any acute findings.  Stool tested positive for C. difficile.  He was also positive for COVID-19.  He was hospitalized for further management.   Subjective:   Patient in bed resting comfortably, denies any headache chest or abdominal pain, no shortness of breath.  States diarrhea has resolved.   Assessment  & Plan :     1.   Acute Covid 19 Viral Pneumonitis during the ongoing 2020 Covid 19 Pandemic - he has mild pulmonary disease with no pulmonary symptoms or hypoxia, finished remdesivir course, tapered off steroids.  Encouraged the patient to sit up in chair in the daytime use I-S and flutter valve for pulmonary toiletry and then prone in bed when at night.    Recent Labs  Lab 03/25/19 1951 03/26/19 0257 03/27/19 0257 03/28/19 0051 03/29/19 0050 03/30/19 0338  CRP  --  11.1* 10.0* 8.9* 4.3* 3.0*  DDIMER  --  1.67*  --   --   --   --   FERRITIN  --   --   --  623*  --   --   PROCALCITON  --  31.79  --   --  3.83 1.53  SARSCOV2NAA POSITIVE*  --   --    --   --   --     Hepatic Function Latest Ref Rng & Units 03/30/2019 03/29/2019 03/28/2019  Total Protein  6.5 - 8.1 g/dL 4.7(L) 4.7(L) 5.2(L)  Albumin 3.5 - 5.0 g/dL 2.3(L) 2.3(L) 2.5(L)  AST 15 - 41 U/L 49(H) 60(H) 74(H)  ALT 0 - 44 U/L 28 30 34  Alk Phosphatase 38 - 126 U/L 36(L) 32(L) 42  Total Bilirubin 0.3 - 1.2 mg/dL 1.0 0.7 0.6      2.  C. difficile diarrhea with sepsis.  Has almost completely resolved on oral vancomycin, continue for another 7 days.  Sepsis pathophysiology has resolved.  3.  Dehydration and hypokalemia.  Dehydration and hypokalemia resolved after IV fluids and potassium replacement.  4.  Dementia.  Supportive care.  5.  Hematuria with AKI.  AKI resolved with hydration.  CT scan abdomen did not show any stones, outpatient follow-up with urologist post discharge.  6.  Macrocytic acute on chronic anemia.  Likely due to heme dilution, patient anemia work-up as age-appropriate.  7.  Incidental findings of CT scan. Asymmetric thickening of the right obturators interna with pelvic fat edema noted, PT and ambulate, at rest asymptomatic, will monitor closely if needed hip MRI will be done outpatient.    Condition - Extremely Guarded  Family Communication  :  None  Code Status :  DNR  Diet :   Diet Order            DIET DYS 3 Room service appropriate? Yes; Fluid consistency: Thin  Diet effective now               Disposition Plan  :  TBD  Consults  :  None  Procedures  :    PUD Prophylaxis : None  DVT Prophylaxis  :   Heparin   Lab Results  Component Value Date   PLT 185 03/30/2019    Inpatient Medications  Scheduled Meds:  divalproex  125 mg Oral BID   donepezil  10 mg Oral Daily   heparin  5,000 Units Subcutaneous Q8H   levothyroxine  75 mcg Oral Q0600   memantine  5 mg Oral BID   multivitamin with minerals  1 tablet Oral Q breakfast   vancomycin  125 mg Oral QID   Continuous Infusions:  PRN Meds:.acetaminophen **OR**  acetaminophen, lip balm, ondansetron **OR** ondansetron (ZOFRAN) IV  Antibiotics  :    Anti-infectives (From admission, onward)   Start     Dose/Rate Route Frequency Ordered Stop   03/27/19 1000  remdesivir 100 mg in sodium chloride 0.9 % 100 mL IVPB     100 mg 200 mL/hr over 30 Minutes Intravenous Daily 03/26/19 0255 03/30/19 0844   03/26/19 0400  remdesivir 200 mg in sodium chloride 0.9% 250 mL IVPB     200 mg 580 mL/hr over 30 Minutes Intravenous Once 03/26/19 0255 03/26/19 0519   03/26/19 0030  vancomycin (VANCOCIN) 50 mg/mL oral solution 125 mg     125 mg Oral 4 times daily 03/26/19 0019 04/04/19 2159   03/25/19 2030  cefTRIAXone (ROCEPHIN) 1 g in sodium chloride 0.9 % 100 mL IVPB  Status:  Discontinued     1 g 200 mL/hr over 30 Minutes Intravenous Every 24 hours 03/25/19 2014 03/25/19 2326       Time Spent in minutes  30   Lala Lund M.D on 03/30/2019 at 9:52 AM  To page go to [email or web address] - password [facility]  [facility] -  Office  [phone]    See all Orders from today for further details    Objective:   Vitals:   03/30/19 0200 03/30/19  0500 03/30/19 0639 03/30/19 0735  BP:   (!) 133/59 (!) 149/65  Pulse: 81 79  77  Resp: '14 17  13  '$ Temp:  97.9 F (36.6 C)  98.3 F (36.8 C)  TempSrc:    Oral  SpO2: 97% 97%  98%  Weight:      Height:        Wt Readings from Last 3 Encounters:  03/25/19 86.2 kg  09/18/17 82.6 kg  08/23/17 88.8 kg     Intake/Output Summary (Last 24 hours) at 03/30/2019 0952 Last data filed at 03/30/2019 0600 Gross per 24 hour  Intake 420 ml  Output 400 ml  Net 20 ml     Physical Exam  Awake but mildly confused, No new F.N deficits, Normal affect [place].AT,PERRAL Supple Neck,No JVD, No cervical lymphadenopathy appriciated.  Symmetrical Chest wall movement, Good air movement bilaterally, CTAB RRR,No Gallops, Rubs or new Murmurs, No Parasternal Heave +ve B.Sounds, Abd Soft, No tenderness, No organomegaly appriciated, No  rebound - guarding or rigidity. No Cyanosis, Clubbing or edema,      Data Review:    CBC Recent Labs  Lab 03/25/19 1833 03/26/19 0257 03/27/19 0257 03/28/19 0051 03/29/19 0050 03/30/19 0338  WBC 19.3* 30.3* 18.5* 10.2 5.2 5.6  HGB 13.4 12.6* 9.7* 9.4* 8.9* 9.2*  HCT 40.4 37.3* 28.9* 28.4* 27.3* 28.3*  PLT 151 151 144* 146* 156 185  MCV 115.4* 113.4* 114.7* 114.5* 112.8* 114.1*  MCH 38.3* 38.3* 38.5* 37.9* 36.8* 37.1*  MCHC 33.2 33.8 33.6 33.1 32.6 32.5  RDW 14.7 14.9 15.0 14.6 14.2 14.1  LYMPHSABS 1.8  --  1.1 2.4 1.8 1.9  MONOABS 6.1*  --  1.5* 2.0* 1.3* 1.3*  EOSABS 0.1  --  0.0 0.0 0.0 0.0  BASOSABS 0.0  --  0.0 0.0 0.0 0.0    Chemistries  Recent Labs  Lab 03/26/19 0257 03/27/19 0257 03/28/19 0051 03/29/19 0050 03/30/19 0338  NA 141 134* 135 140 138  K 4.4 4.1 3.8 3.1* 3.9  CL 105 105 105 108 105  CO2 24 20* 21* 25 26  GLUCOSE 120* 74 78 90 93  BUN 39* 47* 38* 24* 18  CREATININE 2.70* 1.79* 1.21 0.98 0.86  CALCIUM 8.7* 7.8* 8.0* 8.1* 8.0*  MG  --  1.9  --   --  1.9  AST 88* 81* 74* 60* 49*  ALT 35 34 34 30 28  ALKPHOS 40 37* 42 32* 36*  BILITOT 0.4 0.9 0.6 0.7 1.0   ------------------------------------------------------------------------------------------------------------------ No results for input(s): CHOL, HDL, LDLCALC, TRIG, CHOLHDL, LDLDIRECT in the last 72 hours.  No results found for: HGBA1C ------------------------------------------------------------------------------------------------------------------ No results for input(s): TSH, T4TOTAL, T3FREE, THYROIDAB in the last 72 hours.  Invalid input(s): FREET3  Cardiac Enzymes No results for input(s): CKMB, TROPONINI, MYOGLOBIN in the last 168 hours.  Invalid input(s): CK ------------------------------------------------------------------------------------------------------------------ No results found for: BNP  Micro Results Recent Results (from the past 240 hour(s))  Blood Culture (routine  x 2)     Status: None (Preliminary result)   Collection Time: 03/25/19  4:45 AM   Specimen: BLOOD RIGHT FOREARM  Result Value Ref Range Status   Specimen Description BLOOD RIGHT FOREARM  Final   Special Requests   Final    BOTTLES DRAWN AEROBIC AND ANAEROBIC Blood Culture adequate volume   Culture   Final    NO GROWTH 4 DAYS Performed at [facility], [address], [place], [place] [address]    Report Status PENDING  Incomplete  Blood Culture (routine x 2)     Status: None   Collection Time: 03/25/19  6:33 PM   Specimen: BLOOD  Result Value Ref Range Status   Specimen Description BLOOD RIGHT ANTECUBITAL  Final   Special Requests   Final    BOTTLES DRAWN AEROBIC AND ANAEROBIC Blood Culture results may not be optimal due to an inadequate volume of blood received in culture bottles   Culture   Final    NO GROWTH 5 DAYS Performed at [facility], [place] [address] [place], [place] [address]    Report Status 03/30/2019 FINAL  Final  SARS CORONAVIRUS 2 (TAT 6-24 HRS) Nasopharyngeal Nasopharyngeal Swab     Status: Abnormal   Collection Time: 03/25/19  7:51 PM   Specimen: Nasopharyngeal Swab  Result Value Ref Range Status   SARS Coronavirus 2 POSITIVE (A) NEGATIVE Final    Comment: RESULT CALLED TO, READ BACK BY AND VERIFIED WITH: MONIQUE DAVIS RN.'@[id number]'$  ON 12.29.2020 BY TCALDWELL MT. (NOTE) SARS-CoV-2 target nucleic acids are DETECTED. The SARS-CoV-2 RNA is generally detectable in upper and lower respiratory specimens during the acute phase of infection. Positive results are indicative of the presence of SARS-CoV-2 RNA. Clinical correlation with patient history and other diagnostic information is  necessary to determine patient infection status. Positive results do not rule out bacterial infection or co-infection with other viruses.  The expected result is Negative. Fact Sheet for Patients: SugarRoll.be Fact Sheet for Healthcare  Providers: https://www.woods-mathews.com/ This test is not yet approved or cleared by the [place] FDA and  has been authorized for detection and/or diagnosis of SARS-CoV-2 by FDA under an Emergency Use Authorization (EUA). This EUA will remain  in effect (meaning this test ca n be used) for the duration of the COVID-19 declaration under Section 564(b)(1) of the Act, 21 U.S.C. section 360bbb-3(b)(1), unless the authorization is terminated or revoked sooner. Performed at [facility], [place] [address], [place], [place] [address]   Urine culture     Status: Abnormal   Collection Time: 03/25/19  9:07 PM   Specimen: In/Out Cath Urine  Result Value Ref Range Status   Specimen Description IN/OUT CATH URINE  Final   Special Requests   Final    NONE Performed at [facility], [place] [address] [place], [place] [address]    Culture MULTIPLE SPECIES PRESENT, SUGGEST RECOLLECTION (A)  Final   Report Status 03/26/2019 FINAL  Final  C difficile quick scan w PCR reflex     Status: Abnormal   Collection Time: 03/25/19 10:58 PM   Specimen: STOOL  Result Value Ref Range Status   C Diff antigen POSITIVE (A) NEGATIVE Final   C Diff toxin NEGATIVE NEGATIVE Final   C Diff interpretation Results are indeterminate. See PCR results.  Final    Comment: Performed at [facility], [place] [address], [place],  [address]  C. Diff by PCR, Reflexed     Status: Abnormal   Collection Time: 03/25/19 10:58 PM  Result Value Ref Range Status   Toxigenic C. Difficile by PCR POSITIVE (A) NEGATIVE Final    Comment: Positive for toxigenic C. difficile with little to no toxin production. Only treat if clinical presentation suggests symptomatic illness. Performed at [facility], [place] [address], [place],  [address]     Radiology Reports CT HEAD WO CONTRAST  Result Date: 03/25/2019 CLINICAL DATA:  Altered mental status EXAM: CT HEAD WITHOUT CONTRAST TECHNIQUE:  Contiguous axial images were obtained from the base of the skull through the vertex without intravenous contrast. COMPARISON:  MRI 10/25/2017, CT brain 10/25/2017 FINDINGS: Brain: No acute territorial infarction, hemorrhage, or intracranial mass. Atrophy and moderate hypodensity in the white matter consistent with chronic small vessel ischemic change. Stable enlarged ventricles. Vascular: No hyperdense vessels. Carotid vascular and vertebral artery calcification. Skull: Normal. Negative for fracture or focal lesion. Sinuses/Orbits: Mild mucosal thickening in the sinuses Other: None IMPRESSION: 1. No CT evidence for acute intracranial abnormality. 2. Atrophy and chronic small vessel ischemic changes of the white matter Electronically Signed   By: Donavan Foil M.D.   On: 03/25/2019 23:14   DG Chest Port 1 View  Result Date: 03/25/2019 CLINICAL DATA:  Fever EXAM: PORTABLE CHEST 1 VIEW COMPARISON:  07/13/2017 FINDINGS: No focal airspace disease or effusion. Atelectasis or scar at the left base. Normal heart size. Aortic atherosclerosis. No pneumothorax. Non inclusion of the right CP angle. IMPRESSION: No active disease.  Scarring or atelectasis at the left base. Electronically Signed   By: Donavan Foil M.D.   On: 03/25/2019 19:49   CT Renal Stone Study  Result Date: 03/26/2019 CLINICAL DATA:  Hematuria from unknown cause. Loss of appetite fever and diarrhea. COVID-19 positive EXAM: CT ABDOMEN AND PELVIS WITHOUT CONTRAST TECHNIQUE: Multidetector CT imaging of the abdomen and pelvis was performed following the standard protocol without IV contrast. COMPARISON:  03/09/2017 FINDINGS: Lower chest: Volume loss and subpleural reticular density in the left lower lobe which has a scar-like or atelectatic appearance. Coronary calcification. Hepatobiliary: No focal liver abnormality.No evidence of biliary obstruction or stone. Pancreas: Generalized atrophy Spleen: Unremarkable. Adrenals/Urinary Tract: Negative  adrenals. No hydronephrosis or stone. Chronic bladder wall thickening with diverticula/cellule suggesting chronic outlet obstruction. Stomach/Bowel: Mild stranding around the rectum without underlying wall thickening. Sigmoid diverticulosis without active inflammation. No bowel obstruction or appendicitis. Vascular/Lymphatic: Multifocal atherosclerotic calcification with implied high-grade bilateral proximal common iliac stenosis due to the degree of bulky calcified plaque. No mass or adenopathy. Reproductive:Negative Other: No ascites or pneumoperitoneum. Musculoskeletal: Unusual generalized asymmetric thickening of the obturators internus on the right. No underlying fracture or hip joint effusion is seen. There is spinal degeneration with L4-5 anterolisthesis and high-grade lower lumbar spinal stenosis. Remote T11 compression fracture with advanced height loss IMPRESSION: 1. No specific explanation for hematuria. There are changes of chronic bladder outlet obstruction with only mild bladder distension today. 2. Abnormal asymmetric thickening of the right obturators interna with pelvic fat edema, question right hip pain and trauma. Pelvis MRI would be the most definitive study for workup. 3. Chronic findings are described above. Electronically Signed   By: Monte Fantasia M.D.   On: 03/26/2019 04:29

## 2019-03-30 NOTE — Progress Notes (Signed)
Wife Spencer Howard, called and given update on patient.

## 2019-03-30 NOTE — Progress Notes (Signed)
Occupational Therapy Evaluation - late entry  PTA, pt resided in a SNF. Unsure of PLOF as pt could not give information due to cognitive deficits. Pt very appropriate during session and states he was "up and moving", although pt did not know where he was or why he was in the hospital. Required Max A with bed mobility and min A with self feeding. Notd more difficulty using RUE than L - unsure of baseline.See deficits below. Pt will need assistance with meals to increase PO intake - nsg aware. Pt left in chair position in bed with Big Band music playing which he enjoys. VSS. Pt very appreciative. Will follow acutely and recommend DC back to SNF for rehab.     03/29/19 0530  OT Visit Information  Last OT Received On 03/30/19  Assistance Needed +2  History of Present Illness 86 y.o. male with medical history significant of dementia associated with prior EtOHism, depression, HTN.  Patient lives in a nursing facility.  Pt sent in to ED with AMS.  There was some concern for syncopal episode as well.  There was also apparently some concern for strokelike symptoms.  He was also experiencing diarrhea at the nursing facility.  CT head did not show any acute findings.  Stool tested positive for C. difficile.  He was also positive for COVID-19.  Precautions  Precautions Fall  Precaution Comments flexi-seal  Home Living  Family/patient expects to be discharged to: Skilled nursing facility  Prior Function  Level of Independence Needs assistance  Comments Unsure of PLOF; pt unable to report; State he was "up and moving around"  Communication  Communication HOH  Pain Assessment  Pain Assessment Faces  Faces Pain Scale 0  Cognition  Arousal/Alertness Awake/alert  Behavior During Therapy WFL for tasks assessed/performed  Overall Cognitive Status No family/caregiver present to determine baseline cognitive functioning  General Comments Hx of dementia; appropriately conversational throughout session; very  complimentary and appreciative; repeatted same questions thorughout session  Upper Extremity Assessment  Upper Extremity Assessment RUE deficits/detail;LUE deficits/detail  RUE Deficits / Details requires VC to use RUE; decreased strength throughout; note increased difficulty with coordination  RUE Coordination decreased fine motor;decreased gross motor  LUE Deficits / Details generalized weakness  Lower Extremity Assessment  Lower Extremity Assessment Defer to PT evaluation  Cervical / Trunk Assessment  Cervical / Trunk Assessment Other exceptions (unaware in bed of leaning to L side)  ADL  Overall ADL's  Needs assistance/impaired  Eating/Feeding Minimal assistance  Eating/Feeding Details (indicate cue type and reason) required assist to scoop onto utensil at times; VC to increase PO intake; feel pt would forget he had taken bites at times; feeding self with L hand but did not use R hand spontaneously to help manage food  Grooming Moderate assistance;Sitting  Upper Body Bathing Moderate assistance;Bed level  Lower Body Bathing Maximal assistance;Bed level  Upper Body Dressing  Maximal assistance;Sitting  Lower Body Dressing Total assistance;Bed level  Toileting- Clothing Manipulation and Hygiene Total assistance  Toileting - Clothing Manipulation Details (indicate cue type and reason) flexiseal;condom cath  Functional mobility during ADLs Maximal assistance (for bed mobility; pt attempting to assist)  Bed Mobility  Overal bed mobility Needs Assistance  Bed Mobility Rolling  Rolling Max assist  Transfers  General transfer comment will need +2; May be able to use Stedy  Balance  Overall balance assessment Needs assistance  Sitting balance-Leahy Scale Poor  OT - End of Session  Activity Tolerance Patient tolerated treatment well  Patient  left in bed;with call bell/phone within reach;with bed alarm set;Other (comment) (chair position in bed)  Nurse Communication Mobility  status;Other (comment) (level of assist with feeding)  OT Assessment  OT Recommendation/Assessment Patient needs continued OT Services  OT Visit Diagnosis Other abnormalities of gait and mobility (R26.89);Muscle weakness (generalized) (M62.81);Other symptoms and signs involving cognitive function  OT Problem List Decreased strength;Decreased range of motion;Decreased activity tolerance;Impaired balance (sitting and/or standing);Decreased coordination;Decreased cognition;Decreased safety awareness;Decreased knowledge of use of DME or AE;Cardiopulmonary status limiting activity  OT Plan  OT Frequency (ACUTE ONLY) Min 2X/week  OT Treatment/Interventions (ACUTE ONLY) Self-care/ADL training;Therapeutic exercise;Neuromuscular education;Energy conservation;DME and/or AE instruction;Therapeutic activities;Cognitive remediation/compensation;Patient/family education;Balance training  AM-PAC OT "6 Clicks" Daily Activity Outcome Measure (Version 2)  Help from another person eating meals? 3  Help from another person taking care of personal grooming? 3  Help from another person toileting, which includes using toliet, bedpan, or urinal? 1  Help from another person bathing (including washing, rinsing, drying)? 2  Help from another person to put on and taking off regular upper body clothing? 2  Help from another person to put on and taking off regular lower body clothing? 1  6 Click Score 12  OT Recommendation  Recommendations for Other Services PT consult  Follow Up Recommendations SNF;Supervision/Assistance - 24 hour  OT Equipment None recommended by OT  Individuals Consulted  Consulted and Agree with Results and Recommendations Patient unable/family or caregiver not available  Acute Rehab OT Goals  Patient Stated Goal to get better  OT Goal Formulation Patient unable to participate in goal setting  Time For Goal Achievement 04/12/19  Potential to Achieve Goals Good  OT Time Calculation  OT Start Time  (ACUTE ONLY) 1750  OT Stop Time (ACUTE ONLY) 1820  OT Time Calculation (min) 30 min  OT General Charges  $OT Visit 1 Visit  Written Expression  Dominant Hand Left (using L to feed self; increased tremor with R)  Maurie Boettcher, OT/L   Acute OT Clinical Specialist [place] Pager [phone] Office [phone]

## 2019-03-30 NOTE — Plan of Care (Signed)

## 2019-03-30 NOTE — Progress Notes (Signed)
Patient transferred to chair this am and back to bed this afternoon with max assist x 2 nurses. Patient unable to assist in transfer, and had to be lifted to keep from sliding to the floor on both occasions. Patient currently in bed, no distress noted at this time.

## 2019-03-31 DIAGNOSIS — U071 COVID-19: Secondary | ICD-10-CM | POA: Diagnosis not present

## 2019-03-31 DIAGNOSIS — D649 Anemia, unspecified: Secondary | ICD-10-CM | POA: Diagnosis not present

## 2019-03-31 DIAGNOSIS — E039 Hypothyroidism, unspecified: Secondary | ICD-10-CM | POA: Diagnosis not present

## 2019-03-31 DIAGNOSIS — M2550 Pain in unspecified joint: Secondary | ICD-10-CM | POA: Diagnosis not present

## 2019-03-31 DIAGNOSIS — M810 Age-related osteoporosis without current pathological fracture: Secondary | ICD-10-CM | POA: Diagnosis not present

## 2019-03-31 DIAGNOSIS — G9340 Encephalopathy, unspecified: Secondary | ICD-10-CM | POA: Diagnosis not present

## 2019-03-31 DIAGNOSIS — I10 Essential (primary) hypertension: Secondary | ICD-10-CM | POA: Diagnosis not present

## 2019-03-31 DIAGNOSIS — R54 Age-related physical debility: Secondary | ICD-10-CM | POA: Diagnosis not present

## 2019-03-31 DIAGNOSIS — R410 Disorientation, unspecified: Secondary | ICD-10-CM | POA: Diagnosis not present

## 2019-03-31 DIAGNOSIS — Z7401 Bed confinement status: Secondary | ICD-10-CM | POA: Diagnosis not present

## 2019-03-31 DIAGNOSIS — Z79899 Other long term (current) drug therapy: Secondary | ICD-10-CM | POA: Diagnosis not present

## 2019-03-31 DIAGNOSIS — I959 Hypotension, unspecified: Secondary | ICD-10-CM | POA: Diagnosis not present

## 2019-03-31 DIAGNOSIS — F0281 Dementia in other diseases classified elsewhere with behavioral disturbance: Secondary | ICD-10-CM | POA: Diagnosis not present

## 2019-03-31 DIAGNOSIS — J1282 Pneumonia due to coronavirus disease 2019: Secondary | ICD-10-CM | POA: Diagnosis not present

## 2019-03-31 DIAGNOSIS — R5381 Other malaise: Secondary | ICD-10-CM | POA: Diagnosis not present

## 2019-03-31 DIAGNOSIS — A0472 Enterocolitis due to Clostridium difficile, not specified as recurrent: Secondary | ICD-10-CM | POA: Diagnosis not present

## 2019-03-31 DIAGNOSIS — R5383 Other fatigue: Secondary | ICD-10-CM | POA: Diagnosis not present

## 2019-03-31 DIAGNOSIS — R05 Cough: Secondary | ICD-10-CM | POA: Diagnosis not present

## 2019-03-31 LAB — CBC WITH DIFFERENTIAL/PLATELET
Abs Immature Granulocytes: 0.12 10*3/uL — ABNORMAL HIGH (ref 0.00–0.07)
Basophils Absolute: 0 10*3/uL (ref 0.0–0.1)
Basophils Relative: 0 %
Eosinophils Absolute: 0.1 10*3/uL (ref 0.0–0.5)
Eosinophils Relative: 1 %
HCT: 28.2 % — ABNORMAL LOW (ref 39.0–52.0)
Hemoglobin: 9.3 g/dL — ABNORMAL LOW (ref 13.0–17.0)
Immature Granulocytes: 2 %
Lymphocytes Relative: 38 %
Lymphs Abs: 2.3 10*3/uL (ref 0.7–4.0)
MCH: 37.3 pg — ABNORMAL HIGH (ref 26.0–34.0)
MCHC: 33 g/dL (ref 30.0–36.0)
MCV: 113.3 fL — ABNORMAL HIGH (ref 80.0–100.0)
Monocytes Absolute: 1.6 10*3/uL — ABNORMAL HIGH (ref 0.1–1.0)
Monocytes Relative: 27 %
Neutro Abs: 1.9 10*3/uL (ref 1.7–7.7)
Neutrophils Relative %: 32 %
Platelets: 207 10*3/uL (ref 150–400)
RBC: 2.49 MIL/uL — ABNORMAL LOW (ref 4.22–5.81)
RDW: 14 % (ref 11.5–15.5)
WBC: 6 10*3/uL (ref 4.0–10.5)
nRBC: 0 % (ref 0.0–0.2)

## 2019-03-31 LAB — COMPREHENSIVE METABOLIC PANEL
ALT: 27 U/L (ref 0–44)
AST: 45 U/L — ABNORMAL HIGH (ref 15–41)
Albumin: 2.6 g/dL — ABNORMAL LOW (ref 3.5–5.0)
Alkaline Phosphatase: 38 U/L (ref 38–126)
Anion gap: 8 (ref 5–15)
BUN: 16 mg/dL (ref 8–23)
CO2: 26 mmol/L (ref 22–32)
Calcium: 8.3 mg/dL — ABNORMAL LOW (ref 8.9–10.3)
Chloride: 105 mmol/L (ref 98–111)
Creatinine, Ser: 0.88 mg/dL (ref 0.61–1.24)
GFR calc Af Amer: >60 mL/min (ref >60)
GFR calc non Af Amer: >60 mL/min (ref >60)
Glucose, Bld: 85 mg/dL (ref 70–99)
Potassium: 4 mmol/L (ref 3.5–5.1)
Sodium: 139 mmol/L (ref 135–145)
Total Bilirubin: 1.1 mg/dL (ref 0.3–1.2)
Total Protein: 4.9 g/dL — ABNORMAL LOW (ref 6.5–8.1)

## 2019-03-31 LAB — CULTURE, BLOOD (ROUTINE X 2)
Culture: NO GROWTH
Special Requests: ADEQUATE

## 2019-03-31 LAB — C-REACTIVE PROTEIN: CRP: 1.9 mg/dL — ABNORMAL HIGH (ref <1.0)

## 2019-03-31 LAB — MAGNESIUM: Magnesium: 1.7 mg/dL (ref 1.7–2.4)

## 2019-03-31 MED ORDER — VANCOMYCIN 50 MG/ML ORAL SOLUTION: 125.0000 mg | Freq: Four times a day (QID) | ORAL | Status: AC

## 2019-03-31 NOTE — Discharge Summary (Signed)
Spencer Howard LGS:[id number] DOB: Dec 15, 1928 DOA: 03/25/2019  PCP: Haywood Pao, MD  Admit date: 03/25/2019  Discharge date: 03/31/2019  Admitted From: SNF   Disposition:  SNF   Recommendations for Outpatient Follow-up:   Follow up with PCP in 1-2 weeks  PCP Please obtain BMP/CBC, 2 view CXR in 1week,  (see Discharge instructions)   PCP Please follow up on the following pending results: PCP/SNF MD to review CT scan findings.  Arrange for right hip outpatient MRI if needed.   Home Health: None   Equipment/Devices: None  Consultations: None  Discharge Condition: Stable    CODE STATUS: Full    Diet Recommendation: Dysphagia 3 diet    Chief Complaint  Patient presents with  . Transient Ischemic Attack     Brief history of present illness from the day of admission and additional interim summary    86 y.o.malewith medical history significant ofdementia associated with prior EtOHism, depression, HTN. Patient lives in [place] facility. Pt sent in to ED with AMS.There was some concern for syncopal episode as well. There was also apparently some concern for strokelike symptoms. He was also experiencing diarrhea at the nursing facility. CT head did not show any acute findings. Stool tested positive for C. difficile. He was also positive for COVID-19. He was hospitalized for further management.                                                                 Hospital Course   1.   Acute Covid 19 Viral Pneumonitis during the ongoing 2020 Covid 19 Pandemic - he has mild pulmonary disease with no pulmonary symptoms or hypoxia, finished remdesivir course, tapered off steroids.  Stable on room air.    Recent Labs  Lab 03/25/19 1951 03/26/19 0257 03/27/19 0257 03/28/19 0051 03/29/19 0050  03/30/19 0338 03/31/19 0500  CRP  --  11.1* 10.0* 8.9* 4.3* 3.0* 1.9*  DDIMER  --  1.67*  --   --   --   --   --   FERRITIN  --   --   --  623*  --   --   --   PROCALCITON  --  31.79  --   --  3.83 1.53  --   SARSCOV2NAA POSITIVE*  --   --   --   --   --   --     Hepatic Function Latest Ref Rng & Units 03/31/2019 03/30/2019 03/29/2019  Total Protein 6.5 - 8.1 g/dL 4.9(L) 4.7(L) 4.7(L)  Albumin 3.5 - 5.0 g/dL 2.6(L) 2.3(L) 2.3(L)  AST 15 - 41 U/L 45(H) 49(H) 60(H)  ALT 0 - 44 U/L '27 28 30  '$ Alk Phosphatase 38 - 126 U/L 38 36(L) 32(L)  Total Bilirubin 0.3 - 1.2 mg/dL 1.1 1.0 0.7  2.  C. difficile diarrhea with sepsis.  Has almost completely resolved on oral vancomycin, continue for another 7 days at [facility].  Sepsis pathophysiology has resolved.  3.  Dehydration and hypokalemia.  Dehydration and hypokalemia resolved after IV fluids and potassium replacement.  4.  Dementia.  Supportive care.  5.  Hematuria with AKI.  AKI resolved with hydration.  CT scan abdomen did not show any stones, outpatient follow-up with urologist post discharge in 2 to 3 weeks, currently hematuria has resolved, can repeat UA at SNF in 1 to 2 weeks.  6.  Macrocytic acute on chronic anemia.  Likely due to heme dilution, patient anemia work-up as age-appropriate.  7.  Incidental findings of CT scan. Asymmetric thickening of the right obturatorsinterna with pelvic fat edema noted, PT and ambulate, at rest asymptomatic, will monitor closely if needed hip MRI will be done outpatient.      Discharge diagnosis     Principal Problem:   Acute encephalopathy Active Problems:   Essential hypertension   Dementia, Alzheimer's, with behavior disturbance (HCC)   AKI (acute kidney injury) ([place])   Leukocytosis   Hematuria   Diarrhea    Discharge instructions    Discharge Instructions    Discharge instructions   Complete by: As directed    Follow with Primary MD Tisovec, Fransico Him, MD in 7 days   Get CBC, CMP,  2 view Chest X ray -  checked next visit within 1 week by Primary MD    Activity: As tolerated with Full fall precautions use walker/cane & assistance as needed  DispositionSNF  Diet: Dysphagia 3 diet with feeding assistance and aspiration precautions.  Special Instructions: If you have smoked or chewed Tobacco  in the last 2 yrs please stop smoking, stop any regular Alcohol  and or any Recreational drug use.  On your next visit with your primary care physician please Get Medicines reviewed and adjusted.  Please request your Prim.MD to go over all Hospital Tests and Procedure/Radiological results at the follow up, please get all Hospital records sent to your Prim MD by signing hospital release before you go home.  If you experience worsening of your admission symptoms, develop shortness of breath, life threatening emergency, suicidal or homicidal thoughts you must seek medical attention immediately by calling 911 or calling your MD immediately  if symptoms less severe.  You Must read complete instructions/literature along with all the possible adverse reactions/side effects for all the Medicines you take and that have been prescribed to you. Take any new Medicines after you have completely understood and accpet all the possible adverse reactions/side effects.   Increase activity slowly   Complete by: As directed       Discharge Medications   Allergies as of 03/31/2019   No Known Allergies     Medication List    STOP taking these medications   LORazepam 0.5 MG tablet Commonly known as: ATIVAN   midodrine 10 MG tablet Commonly known as: PROAMATINE     TAKE these medications   acetaminophen 325 MG tablet Commonly known as: TYLENOL Take 650 mg by mouth every 4 (four) hours as needed for mild pain.   aspirin EC 81 MG tablet Take 81 mg by mouth daily.   calcium carbonate 1500 (600 Ca) MG Tabs tablet Commonly known as: OSCAL Take 1,500 mg by mouth daily.   Centrum Silver  50+Men Tabs Take 1 tablet by mouth daily with breakfast.   divalproex 125 MG DR tablet Commonly known  as: DEPAKOTE Take 125 mg by mouth 2 (two) times daily.   donepezil 10 MG tablet Commonly known as: ARICEPT TAKE 1 TABLET TWICE A DAY What changed: when to take this   levothyroxine 75 MCG tablet Commonly known as: SYNTHROID Take 1 tablet (75 mcg total) by mouth daily before breakfast.   memantine 5 MG tablet Commonly known as: Namenda Take 1 tablet (5 mg total) by mouth 2 (two) times daily. What changed: how much to take   thiamine 100 MG tablet Take 100 mg by mouth daily.   vancomycin 50 mg/mL  oral solution Commonly known as: VANCOCIN Take 2.5 mLs (125 mg total) by mouth 4 (four) times daily for 8 days.   Vitamin D 50 MCG (2000 UT) tablet Take 2,000 Units by mouth daily.       Follow-up Information    Tisovec, Fransico Him, MD. Schedule an appointment as soon as possible for a visit in 1 week(s).   Specialty: Internal Medicine Contact information: [address] [place] [address] [phone]           Major procedures and Radiology Reports - PLEASE review detailed and final reports thoroughly  -        CT HEAD WO CONTRAST  Result Date: 03/25/2019 CLINICAL DATA:  Altered mental status EXAM: CT HEAD WITHOUT CONTRAST TECHNIQUE: Contiguous axial images were obtained from the base of the skull through the vertex without intravenous contrast. COMPARISON:  MRI 10/25/2017, CT brain 10/25/2017 FINDINGS: Brain: No acute territorial infarction, hemorrhage, or intracranial mass. Atrophy and moderate hypodensity in the white matter consistent with chronic small vessel ischemic change. Stable enlarged ventricles. Vascular: No hyperdense vessels. Carotid vascular and vertebral artery calcification. Skull: Normal. Negative for fracture or focal lesion. Sinuses/Orbits: Mild mucosal thickening in the sinuses Other: None IMPRESSION: 1. No CT evidence for acute intracranial  abnormality. 2. Atrophy and chronic small vessel ischemic changes of the white matter Electronically Signed   By: Donavan Foil M.D.   On: 03/25/2019 23:14   DG Chest Port 1 View  Result Date: 03/25/2019 CLINICAL DATA:  Fever EXAM: PORTABLE CHEST 1 VIEW COMPARISON:  07/13/2017 FINDINGS: No focal airspace disease or effusion. Atelectasis or scar at the left base. Normal heart size. Aortic atherosclerosis. No pneumothorax. Non inclusion of the right CP angle. IMPRESSION: No active disease.  Scarring or atelectasis at the left base. Electronically Signed   By: Donavan Foil M.D.   On: 03/25/2019 19:49   CT Renal Stone Study  Result Date: 03/26/2019 CLINICAL DATA:  Hematuria from unknown cause. Loss of appetite fever and diarrhea. COVID-19 positive EXAM: CT ABDOMEN AND PELVIS WITHOUT CONTRAST TECHNIQUE: Multidetector CT imaging of the abdomen and pelvis was performed following the standard protocol without IV contrast. COMPARISON:  03/09/2017 FINDINGS: Lower chest: Volume loss and subpleural reticular density in the left lower lobe which has a scar-like or atelectatic appearance. Coronary calcification. Hepatobiliary: No focal liver abnormality.No evidence of biliary obstruction or stone. Pancreas: Generalized atrophy Spleen: Unremarkable. Adrenals/Urinary Tract: Negative adrenals. No hydronephrosis or stone. Chronic bladder wall thickening with diverticula/cellule suggesting chronic outlet obstruction. Stomach/Bowel: Mild stranding around the rectum without underlying wall thickening. Sigmoid diverticulosis without active inflammation. No bowel obstruction or appendicitis. Vascular/Lymphatic: Multifocal atherosclerotic calcification with implied high-grade bilateral proximal common iliac stenosis due to the degree of bulky calcified plaque. No mass or adenopathy. Reproductive:Negative Other: No ascites or pneumoperitoneum. Musculoskeletal: Unusual generalized asymmetric thickening of the obturators internus  on the right. No underlying fracture or hip  joint effusion is seen. There is spinal degeneration with L4-5 anterolisthesis and high-grade lower lumbar spinal stenosis. Remote T11 compression fracture with advanced height loss IMPRESSION: 1. No specific explanation for hematuria. There are changes of chronic bladder outlet obstruction with only mild bladder distension today. 2. Abnormal asymmetric thickening of the right obturators interna with pelvic fat edema, question right hip pain and trauma. Pelvis MRI would be the most definitive study for workup. 3. Chronic findings are described above. Electronically Signed   By: Monte Fantasia M.D.   On: 03/26/2019 04:29    Micro Results     Recent Results (from the past 240 hour(s))  Blood Culture (routine x 2)     Status: None   Collection Time: 03/25/19  4:45 AM   Specimen: BLOOD RIGHT FOREARM  Result Value Ref Range Status   Specimen Description BLOOD RIGHT FOREARM  Final   Special Requests   Final    BOTTLES DRAWN AEROBIC AND ANAEROBIC Blood Culture adequate volume   Culture   Final    NO GROWTH 5 DAYS Performed at [facility], [address], [place], [place] [address]    Report Status 03/31/2019 FINAL  Final  Blood Culture (routine x 2)     Status: None   Collection Time: 03/25/19  6:33 PM   Specimen: BLOOD  Result Value Ref Range Status   Specimen Description BLOOD RIGHT ANTECUBITAL  Final   Special Requests   Final    BOTTLES DRAWN AEROBIC AND ANAEROBIC Blood Culture results may not be optimal due to an inadequate volume of blood received in culture bottles   Culture   Final    NO GROWTH 5 DAYS Performed at [facility], [place] [address] [place], [place] [address]    Report Status 03/30/2019 FINAL  Final  SARS CORONAVIRUS 2 (TAT 6-24 HRS) Nasopharyngeal Nasopharyngeal Swab     Status: Abnormal   Collection Time: 03/25/19  7:51 PM   Specimen: Nasopharyngeal Swab  Result Value Ref Range Status   SARS Coronavirus 2  POSITIVE (A) NEGATIVE Final    Comment: RESULT CALLED TO, READ BACK BY AND VERIFIED WITH: MONIQUE DAVIS RN.'@[id number]'$  ON 12.29.2020 BY TCALDWELL MT. (NOTE) SARS-CoV-2 target nucleic acids are DETECTED. The SARS-CoV-2 RNA is generally detectable in upper and lower respiratory specimens during the acute phase of infection. Positive results are indicative of the presence of SARS-CoV-2 RNA. Clinical correlation with patient history and other diagnostic information is  necessary to determine patient infection status. Positive results do not rule out bacterial infection or co-infection with other viruses.  The expected result is Negative. Fact Sheet for Patients: SugarRoll.be Fact Sheet for Healthcare Providers: https://www.woods-mathews.com/ This test is not yet approved or cleared by the [place] FDA and  has been authorized for detection and/or diagnosis of SARS-CoV-2 by FDA under an Emergency Use Authorization (EUA). This EUA will remain  in effect (meaning this test ca n be used) for the duration of the COVID-19 declaration under Section 564(b)(1) of the Act, 21 U.S.C. section 360bbb-3(b)(1), unless the authorization is terminated or revoked sooner. Performed at [facility], [place] [address], [place], [place] [address]   Urine culture     Status: Abnormal   Collection Time: 03/25/19  9:07 PM   Specimen: In/Out Cath Urine  Result Value Ref Range Status   Specimen Description IN/OUT CATH URINE  Final   Special Requests   Final    NONE Performed at [facility], [address] [place], [place] [address]    Culture MULTIPLE SPECIES PRESENT, SUGGEST RECOLLECTION (A)  Final   Report Status 03/26/2019 FINAL  Final  C difficile quick scan w PCR reflex     Status: Abnormal   Collection Time: 03/25/19 10:58 PM   Specimen: STOOL  Result Value Ref Range Status   C Diff antigen POSITIVE (A) NEGATIVE Final   C Diff toxin NEGATIVE NEGATIVE  Final   C Diff interpretation Results are indeterminate. See PCR results.  Final    Comment: Performed at [facility], [place] [address] [place], [place] [address]  C. Diff by PCR, Reflexed     Status: Abnormal   Collection Time: 03/25/19 10:58 PM  Result Value Ref Range Status   Toxigenic C. Difficile by PCR POSITIVE (A) NEGATIVE Final    Comment: Positive for toxigenic C. difficile with little to no toxin production. Only treat if clinical presentation suggests symptomatic illness. Performed at [facility], [place] [address], [place], [name] [address]     Today   Subjective    Spencer Howard today has no headache,no chest abdominal pain,no new weakness tingling or numbness, no diarrhea, feels much better.     Objective   Blood pressure (!) 142/85, pulse 88, temperature 98.8 F (37.1 C), temperature source Axillary, resp. rate 12, height '5\' 11"'$  (1.803 m), weight 86.2 kg, SpO2 97 %.   Intake/Output Summary (Last 24 hours) at 03/31/2019 1124 Last data filed at 03/31/2019 0500 Gross per 24 hour  Intake 120 ml  Output 1300 ml  Net -1180 ml    Exam  Awake, pleasantly confused but in no distress, no focal deficits .AT,PERRAL Supple Neck,No JVD, No cervical lymphadenopathy appriciated.  Symmetrical Chest wall movement, Good air movement bilaterally, CTAB RRR,No Gallops,Rubs or new Murmurs, No Parasternal Heave +ve B.Sounds, Abd Soft, Non tender, No organomegaly appriciated, No rebound -guarding or rigidity. No Cyanosis, Clubbing or edema, No new Rash or bruise   Data Review   CBC w Diff:  Lab Results  Component Value Date   WBC 6.0 03/31/2019   HGB 9.3 (L) 03/31/2019   HGB 12.7 (L) 11/29/2016   HCT 28.2 (L) 03/31/2019   HCT 38.1 11/29/2016   PLT 207 03/31/2019   PLT 246 11/29/2016   LYMPHOPCT 38 03/31/2019   BANDSPCT 12 03/27/2019   MONOPCT 27 03/31/2019   EOSPCT 1 03/31/2019   BASOPCT 0 03/31/2019    CMP:  Lab Results  Component Value Date   NA 139  03/31/2019   NA 140 11/29/2016   K 4.0 03/31/2019   CL 105 03/31/2019   CO2 26 03/31/2019   BUN 16 03/31/2019   BUN 21 11/29/2016   CREATININE 0.88 03/31/2019   PROT 4.9 (L) 03/31/2019   ALBUMIN 2.6 (L) 03/31/2019   BILITOT 1.1 03/31/2019   ALKPHOS 38 03/31/2019   AST 45 (H) 03/31/2019   ALT 27 03/31/2019  .   Total Time in preparing paper work, data evaluation and todays exam - 26 minutes  Lala Lund M.D on 03/31/2019 at 11:24 AM  [facility]   Office  [phone]

## 2019-03-31 NOTE — Progress Notes (Signed)
Patient's wife Manuela Schwartz, notified that patient is leaving for [employer].

## 2019-03-31 NOTE — NC FL2 (Signed)
[place] LEVEL OF CARE SCREENING TOOL     IDENTIFICATION  Patient Name: Spencer Howard Birthdate: 12-Sep-1928 Sex: male Admission Date (Current Location): 03/25/2019  [facility] and [place] Number:  Herbalist and Address:  [facility], [place] [address], [place], [place] [address]      Provider Number: [id number]  Attending Physician Name and Address:  Thurnell Lose, MD  Relative Name and Phone Number:  Manuela Schwartz (spouse) [phone]    Current Level of Care: Hospital Recommended Level of Care: [place] Prior Approval Number:    Date Approved/Denied:   PASRR Number:    Discharge Plan: SNF    Current Diagnoses: Patient Active Problem List   Diagnosis Date Noted  . Acute encephalopathy 03/25/2019  . AKI (acute kidney injury) ([place]) 03/25/2019  . Leukocytosis 03/25/2019  . Hematuria 03/25/2019  . Diarrhea 03/25/2019  . Palliative care encounter   . Fall   . Unresponsive episode   . Altered mental status 09/17/2017  . Anemia 09/17/2017  . Dementia, Alzheimer's, with behavior disturbance ([place]) 05/29/2017  . Orthostatic hypotension 11/04/2016  . Closed left hand fracture 11/02/2016  . Dementia ([place]) 11/02/2016  . Syncope 10/31/2016  . DOE (dyspnea on exertion) 03/05/2016  . Abnormal fourth heart sound (S4) 03/05/2016  . Essential hypertension 02/01/2015  . HLD (hyperlipidemia) 02/01/2015  . Depressed 02/01/2015  . Hypothyroidism 02/01/2015    Orientation RESPIRATION BLADDER Height & Weight     Self  Normal Incontinent, External catheter Weight: 190 lb (86.2 kg) Height:  5\' 11"  (180.3 cm)  BEHAVIORAL SYMPTOMS/MOOD NEUROLOGICAL BOWEL NUTRITION STATUS      Incontinent Diet(see discharge summary)  AMBULATORY STATUS COMMUNICATION OF NEEDS Skin   Extensive Assist Verbally Normal                       Personal Care Assistance Level of Assistance  Bathing, Feeding, Dressing, Total care Bathing  Assistance: Maximum assistance Feeding assistance: Limited assistance Dressing Assistance: Maximum assistance Total Care Assistance: Maximum assistance   Functional Limitations Info  Sight, Hearing, Speech Sight Info: Adequate Hearing Info: Adequate Speech Info: Adequate    SPECIAL CARE FACTORS FREQUENCY  PT (By licensed PT), OT (By licensed OT)     PT Frequency: min 5x weekly OT Frequency: min 5x weekly            Contractures Contractures Info: Not present    Additional Factors Info  Code Status, Allergies, Isolation Precautions Code Status Info: DNR Allergies Info: No Known Allergies     Isolation Precautions Info: Covid 19 virus     Current Medications (03/31/2019):  This is the current hospital active medication list Current Facility-Administered Medications  Medication Dose Route Frequency Provider Last Rate Last Admin  . acetaminophen (TYLENOL) tablet 650 mg  650 mg Oral Q6H PRN Etta Quill, DO       Or  . acetaminophen (TYLENOL) suppository 650 mg  650 mg Rectal Q6H PRN Etta Quill, DO      . divalproex (DEPAKOTE) DR tablet 125 mg  125 mg Oral BID Bonnielee Haff, MD   125 mg at 03/30/19 2003  . donepezil (ARICEPT) tablet 10 mg  10 mg Oral Daily Bonnielee Haff, MD   10 mg at 03/30/19 0809  . heparin injection 5,000 Units  5,000 Units Subcutaneous Q8H Etta Quill, DO   5,000 Units at 03/31/19 0601  . levothyroxine (SYNTHROID) tablet 75 mcg  75 mcg  Oral Q0600 Bonnielee Haff, MD   75 mcg at 03/31/19 0601  . lip balm (CARMEX) ointment 1 application  1 application Topical PRN Bonnielee Haff, MD      . memantine [facility]) tablet 5 mg  5 mg Oral BID Bonnielee Haff, MD   5 mg at 03/30/19 2004  . multivitamin with minerals tablet 1 tablet  1 tablet Oral Q breakfast Bonnielee Haff, MD   1 tablet at 03/30/19 0809  . ondansetron (ZOFRAN) tablet 4 mg  4 mg Oral Q6H PRN Etta Quill, DO       Or  . ondansetron [facility]) injection 4 mg  4 mg Intravenous  Q6H PRN Etta Quill, DO      . vancomycin (VANCOCIN) 50 mg/mL oral solution 125 mg  125 mg Oral QID Jennette Kettle M, DO   125 mg at 03/31/19 [id number]     Discharge Medications: Please see discharge summary for a list of discharge medications.  Relevant Imaging Results:  Relevant Lab Results:   Additional Information SSN: [id number]  Alberteen Sam, LCSW

## 2019-03-31 NOTE — TOC Transition Note (Signed)
Transition of Care [facility]) - CM/SW Discharge Note   Patient Details  Name: Araceli Orvis MRN: [id number] Date of Birth: 1929-03-14  [facility] CM/SW Contact:  Alberteen Sam, LCSW Phone Number: 03/31/2019, 11:47 AM   Clinical Narrative:     Patient will DC to: [place] Anticipated DC date: 03/31/2019 Family notified:Susan [employer] [id number]  Per MD patient ready for DC to [name] . RN, patient, patient's family, and facility notified of DC. Discharge Summary sent to facility. RN given number for report [phone]   . DC packet on chart. Ambulance transport requested for patient.  CSW signing off.  [place], [place]   Final next level of care: Skilled Nursing Facility Barriers to Discharge: No Barriers Identified   Patient Goals and CMS Choice   CMS Medicare.gov Compare Post Acute Care list provided to:: Patient Represenative (must comment)(Susan (spouse)) Choice offered to / list presented to : Spouse  Discharge Placement PASRR number recieved: (facility will get - waiver)            Patient chooses bed at: [facility] Patient to be transferred to facility by: [place] Name of family member notified: Manuela Schwartz Patient and family notified of of transfer: 03/31/19  Discharge Plan and Services     Post Acute Care Choice: [place]                               Social Determinants of Health (SDOH) Interventions     Readmission Risk Interventions No flowsheet data found.

## 2019-03-31 NOTE — Plan of Care (Signed)

## 2019-03-31 NOTE — TOC Initial Note (Signed)
Transition of Care [facility]) - Initial/Assessment Note    Patient Details  Name: Spencer Howard MRN: [id number] Date of Birth: 18-Dec-1928  Transition of Care [facility]) CM/SW Contact:    Alberteen Sam, [place] Phone Number: [phone] 03/31/2019, 8:40 AM  Clinical Narrative:                  CSW spoke with patient's spouse Manuela Schwartz regarding patient's discharge plan, she is in agreement with patient returning to [place].   CSW reached out to [facility] and they are able to accept patient back. CSW informed facility of no PASRR number in [place] [facility] however did receive error message. They are in agreement to accept patient today, and call [place] [facility] 1/4 when they open to correct any errors. CSW provided number for [facility] to [facility] admissions [place].   Kelly requests patient be discharge after noon today to ensure staff is ready to accept.  Expected Discharge Plan: Skilled Nursing Facility Barriers to Discharge: No Barriers Identified   Patient Goals and CMS Choice   CMS Medicare.gov Compare Post Acute Care list provided to:: Patient Represenative (must comment)(spouse Manuela Schwartz) Choice offered to / list presented to : Spouse  Expected Discharge Plan and Services Expected Discharge Plan: [place] Acute Care Choice: [place] Living arrangements for the past 2 months: Skilled Nursing Facility([facility]) Expected Discharge Date: 03/31/19                                    Prior Living Arrangements/Services Living arrangements for the past 2 months: Skilled Nursing Facility([facility]) Lives with:: Self Patient language and need for interpreter reviewed:: Yes Do you feel safe going back to the place where you live?: Yes      Need for Family Participation in Patient Care: Yes (Comment) Care giver support system in place?: Yes (comment)   Criminal Activity/Legal Involvement Pertinent to Current Situation/Hospitalization: No - Comment as  needed  Activities of Daily Living Home Assistive Devices/Equipment: None ADL Screening (condition at time of admission) Patient's cognitive ability adequate to safely complete daily activities?: No Is the patient deaf or have difficulty hearing?: No Does the patient have difficulty seeing, even when wearing glasses/contacts?: No Does the patient have difficulty concentrating, remembering, or making decisions?: Yes Patient able to express need for assistance with ADLs?: Yes Does the patient have difficulty dressing or bathing?: Yes Independently performs ADLs?: No Does the patient have difficulty walking or climbing stairs?: Yes Weakness of Legs: Both Weakness of Arms/Hands: Both  Permission Sought/Granted Permission sought to share information with : Case Manager, Customer service manager, Family Supports Permission granted to share information with : Yes, Verbal Permission Granted  Share Information with NAME: Manuela Schwartz  Permission granted to share info w AGENCY: SNFs  Permission granted to share info w Relationship: spouse  Permission granted to share info w Contact Information: [phone]  Emotional Assessment Appearance:: Other (Comment Required(unable to assess - remote) Attitude/Demeanor/Rapport: Unable to Assess Affect (typically observed): Unable to Assess Orientation: : Oriented to Self Alcohol / Substance Use: Not Applicable Psych Involvement: No (comment)  Admission diagnosis:  Diarrhea of presumed infectious origin [R19.7] AKI (acute kidney injury) ([place]) [N17.9] Patient Active Problem List   Diagnosis Date Noted  . Acute encephalopathy 03/25/2019  . AKI (acute kidney injury) ([place]) 03/25/2019  . Leukocytosis 03/25/2019  . Hematuria 03/25/2019  . Diarrhea 03/25/2019  . Palliative care encounter   .  Fall   . Unresponsive episode   . Altered mental status 09/17/2017  . Anemia 09/17/2017  . Dementia, Alzheimer's, with behavior disturbance ([place]) 05/29/2017   . Orthostatic hypotension 11/04/2016  . Closed left hand fracture 11/02/2016  . Dementia ([place]) 11/02/2016  . Syncope 10/31/2016  . DOE (dyspnea on exertion) 03/05/2016  . Abnormal fourth heart sound (S4) 03/05/2016  . Essential hypertension 02/01/2015  . HLD (hyperlipidemia) 02/01/2015  . Depressed 02/01/2015  . Hypothyroidism 02/01/2015   PCP:  Haywood Pao, MD Pharmacy:   [facility], [place] [address] [place] [address] Phone: [phone] Fax: [phone]  [facility] [address], [place] [address] Phone: [phone] Fax: [phone]     Social Determinants of Health (SDOH) Interventions    Readmission Risk Interventions No flowsheet data found.

## 2019-03-31 NOTE — Discharge Instructions (Signed)
Follow with Primary MD Tisovec, Fransico Him, MD in 7 days   Get CBC, CMP, 2 view Chest X ray -  checked next visit within 1 week by Primary MD    Activity: As tolerated with Full fall precautions use walker/cane & assistance as needed  DispositionSNF  Diet: Dysphagia 3 diet with feeding assistance and aspiration precautions.  Special Instructions: If you have smoked or chewed Tobacco  in the last 2 yrs please stop smoking, stop any regular Alcohol  and or any Recreational drug use.  On your next visit with your primary care physician please Get Medicines reviewed and adjusted.  Please request your Prim.MD to go over all Hospital Tests and Procedure/Radiological results at the follow up, please get all Hospital records sent to your Prim MD by signing hospital release before you go home.  If you experience worsening of your admission symptoms, develop shortness of breath, life threatening emergency, suicidal or homicidal thoughts you must seek medical attention immediately by calling 911 or calling your MD immediately  if symptoms less severe.  You Must read complete instructions/literature along with all the possible adverse reactions/side effects for all the Medicines you take and that have been prescribed to you. Take any new Medicines after you have completely understood and accpet all the possible adverse reactions/side effects.

## 2019-04-02 DIAGNOSIS — D649 Anemia, unspecified: Secondary | ICD-10-CM | POA: Diagnosis not present

## 2019-04-02 DIAGNOSIS — U071 COVID-19: Secondary | ICD-10-CM | POA: Diagnosis not present

## 2019-04-02 DIAGNOSIS — A0472 Enterocolitis due to Clostridium difficile, not specified as recurrent: Secondary | ICD-10-CM | POA: Diagnosis not present

## 2019-04-02 DIAGNOSIS — M810 Age-related osteoporosis without current pathological fracture: Secondary | ICD-10-CM | POA: Diagnosis not present

## 2019-04-02 DIAGNOSIS — E039 Hypothyroidism, unspecified: Secondary | ICD-10-CM | POA: Diagnosis not present

## 2019-04-02 DIAGNOSIS — R54 Age-related physical debility: Secondary | ICD-10-CM | POA: Diagnosis not present

## 2019-04-02 DIAGNOSIS — F0281 Dementia in other diseases classified elsewhere with behavioral disturbance: Secondary | ICD-10-CM | POA: Diagnosis not present

## 2019-04-03 DIAGNOSIS — A0472 Enterocolitis due to Clostridium difficile, not specified as recurrent: Secondary | ICD-10-CM | POA: Diagnosis not present

## 2019-04-03 DIAGNOSIS — M810 Age-related osteoporosis without current pathological fracture: Secondary | ICD-10-CM | POA: Diagnosis not present

## 2019-04-03 DIAGNOSIS — R54 Age-related physical debility: Secondary | ICD-10-CM | POA: Diagnosis not present

## 2019-04-03 DIAGNOSIS — E039 Hypothyroidism, unspecified: Secondary | ICD-10-CM | POA: Diagnosis not present

## 2019-04-03 DIAGNOSIS — F0281 Dementia in other diseases classified elsewhere with behavioral disturbance: Secondary | ICD-10-CM | POA: Diagnosis not present

## 2019-04-03 DIAGNOSIS — U071 COVID-19: Secondary | ICD-10-CM | POA: Diagnosis not present

## 2019-04-03 DIAGNOSIS — D649 Anemia, unspecified: Secondary | ICD-10-CM | POA: Diagnosis not present

## 2019-04-08 DIAGNOSIS — R05 Cough: Secondary | ICD-10-CM | POA: Diagnosis not present

## 2019-04-10 DIAGNOSIS — A0472 Enterocolitis due to Clostridium difficile, not specified as recurrent: Secondary | ICD-10-CM | POA: Diagnosis not present

## 2019-04-10 DIAGNOSIS — M6281 Muscle weakness (generalized): Secondary | ICD-10-CM | POA: Diagnosis not present

## 2019-04-10 DIAGNOSIS — J1282 Pneumonia due to coronavirus disease 2019: Secondary | ICD-10-CM | POA: Diagnosis not present

## 2019-04-10 DIAGNOSIS — U071 COVID-19: Secondary | ICD-10-CM | POA: Diagnosis not present

## 2019-04-10 DIAGNOSIS — R1312 Dysphagia, oropharyngeal phase: Secondary | ICD-10-CM | POA: Diagnosis not present

## 2019-04-11 DIAGNOSIS — J1282 Pneumonia due to coronavirus disease 2019: Secondary | ICD-10-CM | POA: Diagnosis not present

## 2019-04-11 DIAGNOSIS — R1312 Dysphagia, oropharyngeal phase: Secondary | ICD-10-CM | POA: Diagnosis not present

## 2019-04-11 DIAGNOSIS — M6281 Muscle weakness (generalized): Secondary | ICD-10-CM | POA: Diagnosis not present

## 2019-04-11 DIAGNOSIS — U071 COVID-19: Secondary | ICD-10-CM | POA: Diagnosis not present

## 2019-04-11 DIAGNOSIS — A0472 Enterocolitis due to Clostridium difficile, not specified as recurrent: Secondary | ICD-10-CM | POA: Diagnosis not present

## 2019-04-12 DIAGNOSIS — I959 Hypotension, unspecified: Secondary | ICD-10-CM | POA: Diagnosis not present

## 2019-04-12 DIAGNOSIS — F062 Psychotic disorder with delusions due to known physiological condition: Secondary | ICD-10-CM | POA: Diagnosis not present

## 2019-04-12 DIAGNOSIS — J1282 Pneumonia due to coronavirus disease 2019: Secondary | ICD-10-CM | POA: Diagnosis not present

## 2019-04-12 DIAGNOSIS — R1312 Dysphagia, oropharyngeal phase: Secondary | ICD-10-CM | POA: Diagnosis not present

## 2019-04-12 DIAGNOSIS — F064 Anxiety disorder due to known physiological condition: Secondary | ICD-10-CM | POA: Diagnosis not present

## 2019-04-12 DIAGNOSIS — D649 Anemia, unspecified: Secondary | ICD-10-CM | POA: Diagnosis not present

## 2019-04-12 DIAGNOSIS — M810 Age-related osteoporosis without current pathological fracture: Secondary | ICD-10-CM | POA: Diagnosis not present

## 2019-04-12 DIAGNOSIS — E039 Hypothyroidism, unspecified: Secondary | ICD-10-CM | POA: Diagnosis not present

## 2019-04-12 DIAGNOSIS — F0281 Dementia in other diseases classified elsewhere with behavioral disturbance: Secondary | ICD-10-CM | POA: Diagnosis not present

## 2019-04-12 DIAGNOSIS — U071 COVID-19: Secondary | ICD-10-CM | POA: Diagnosis not present

## 2019-04-12 DIAGNOSIS — A0472 Enterocolitis due to Clostridium difficile, not specified as recurrent: Secondary | ICD-10-CM | POA: Diagnosis not present

## 2019-04-12 DIAGNOSIS — R4182 Altered mental status, unspecified: Secondary | ICD-10-CM | POA: Diagnosis not present

## 2019-04-12 DIAGNOSIS — M6281 Muscle weakness (generalized): Secondary | ICD-10-CM | POA: Diagnosis not present

## 2019-04-12 DIAGNOSIS — R54 Age-related physical debility: Secondary | ICD-10-CM | POA: Diagnosis not present

## 2019-04-12 DIAGNOSIS — G301 Alzheimer's disease with late onset: Secondary | ICD-10-CM | POA: Diagnosis not present

## 2019-04-15 DIAGNOSIS — J1282 Pneumonia due to coronavirus disease 2019: Secondary | ICD-10-CM | POA: Diagnosis not present

## 2019-04-15 DIAGNOSIS — A0472 Enterocolitis due to Clostridium difficile, not specified as recurrent: Secondary | ICD-10-CM | POA: Diagnosis not present

## 2019-04-15 DIAGNOSIS — R1312 Dysphagia, oropharyngeal phase: Secondary | ICD-10-CM | POA: Diagnosis not present

## 2019-04-15 DIAGNOSIS — R54 Age-related physical debility: Secondary | ICD-10-CM | POA: Diagnosis not present

## 2019-04-15 DIAGNOSIS — U071 COVID-19: Secondary | ICD-10-CM | POA: Diagnosis not present

## 2019-04-15 DIAGNOSIS — M6281 Muscle weakness (generalized): Secondary | ICD-10-CM | POA: Diagnosis not present

## 2019-04-15 DIAGNOSIS — E039 Hypothyroidism, unspecified: Secondary | ICD-10-CM | POA: Diagnosis not present

## 2019-04-16 DIAGNOSIS — R1312 Dysphagia, oropharyngeal phase: Secondary | ICD-10-CM | POA: Diagnosis not present

## 2019-04-16 DIAGNOSIS — U071 COVID-19: Secondary | ICD-10-CM | POA: Diagnosis not present

## 2019-04-16 DIAGNOSIS — A0472 Enterocolitis due to Clostridium difficile, not specified as recurrent: Secondary | ICD-10-CM | POA: Diagnosis not present

## 2019-04-16 DIAGNOSIS — J1282 Pneumonia due to coronavirus disease 2019: Secondary | ICD-10-CM | POA: Diagnosis not present

## 2019-04-16 DIAGNOSIS — M6281 Muscle weakness (generalized): Secondary | ICD-10-CM | POA: Diagnosis not present

## 2019-04-17 DIAGNOSIS — J1282 Pneumonia due to coronavirus disease 2019: Secondary | ICD-10-CM | POA: Diagnosis not present

## 2019-04-17 DIAGNOSIS — M6281 Muscle weakness (generalized): Secondary | ICD-10-CM | POA: Diagnosis not present

## 2019-04-17 DIAGNOSIS — R1312 Dysphagia, oropharyngeal phase: Secondary | ICD-10-CM | POA: Diagnosis not present

## 2019-04-17 DIAGNOSIS — U071 COVID-19: Secondary | ICD-10-CM | POA: Diagnosis not present

## 2019-04-17 DIAGNOSIS — A0472 Enterocolitis due to Clostridium difficile, not specified as recurrent: Secondary | ICD-10-CM | POA: Diagnosis not present

## 2019-04-18 DIAGNOSIS — A0472 Enterocolitis due to Clostridium difficile, not specified as recurrent: Secondary | ICD-10-CM | POA: Diagnosis not present

## 2019-04-18 DIAGNOSIS — R1312 Dysphagia, oropharyngeal phase: Secondary | ICD-10-CM | POA: Diagnosis not present

## 2019-04-18 DIAGNOSIS — U071 COVID-19: Secondary | ICD-10-CM | POA: Diagnosis not present

## 2019-04-18 DIAGNOSIS — M6281 Muscle weakness (generalized): Secondary | ICD-10-CM | POA: Diagnosis not present

## 2019-04-18 DIAGNOSIS — J1282 Pneumonia due to coronavirus disease 2019: Secondary | ICD-10-CM | POA: Diagnosis not present

## 2019-04-19 DIAGNOSIS — M6281 Muscle weakness (generalized): Secondary | ICD-10-CM | POA: Diagnosis not present

## 2019-04-19 DIAGNOSIS — A0472 Enterocolitis due to Clostridium difficile, not specified as recurrent: Secondary | ICD-10-CM | POA: Diagnosis not present

## 2019-04-19 DIAGNOSIS — R1312 Dysphagia, oropharyngeal phase: Secondary | ICD-10-CM | POA: Diagnosis not present

## 2019-04-19 DIAGNOSIS — J1282 Pneumonia due to coronavirus disease 2019: Secondary | ICD-10-CM | POA: Diagnosis not present

## 2019-04-19 DIAGNOSIS — U071 COVID-19: Secondary | ICD-10-CM | POA: Diagnosis not present

## 2019-04-22 DIAGNOSIS — J1282 Pneumonia due to coronavirus disease 2019: Secondary | ICD-10-CM | POA: Diagnosis not present

## 2019-04-22 DIAGNOSIS — U071 COVID-19: Secondary | ICD-10-CM | POA: Diagnosis not present

## 2019-04-22 DIAGNOSIS — M6281 Muscle weakness (generalized): Secondary | ICD-10-CM | POA: Diagnosis not present

## 2019-04-22 DIAGNOSIS — R1312 Dysphagia, oropharyngeal phase: Secondary | ICD-10-CM | POA: Diagnosis not present

## 2019-04-22 DIAGNOSIS — A0472 Enterocolitis due to Clostridium difficile, not specified as recurrent: Secondary | ICD-10-CM | POA: Diagnosis not present

## 2019-04-23 DIAGNOSIS — R1312 Dysphagia, oropharyngeal phase: Secondary | ICD-10-CM | POA: Diagnosis not present

## 2019-04-23 DIAGNOSIS — M6281 Muscle weakness (generalized): Secondary | ICD-10-CM | POA: Diagnosis not present

## 2019-04-23 DIAGNOSIS — J1282 Pneumonia due to coronavirus disease 2019: Secondary | ICD-10-CM | POA: Diagnosis not present

## 2019-04-23 DIAGNOSIS — L89312 Pressure ulcer of right buttock, stage 2: Secondary | ICD-10-CM | POA: Diagnosis not present

## 2019-04-23 DIAGNOSIS — R54 Age-related physical debility: Secondary | ICD-10-CM | POA: Diagnosis not present

## 2019-04-23 DIAGNOSIS — D649 Anemia, unspecified: Secondary | ICD-10-CM | POA: Diagnosis not present

## 2019-04-23 DIAGNOSIS — U071 COVID-19: Secondary | ICD-10-CM | POA: Diagnosis not present

## 2019-04-23 DIAGNOSIS — M810 Age-related osteoporosis without current pathological fracture: Secondary | ICD-10-CM | POA: Diagnosis not present

## 2019-04-23 DIAGNOSIS — A0472 Enterocolitis due to Clostridium difficile, not specified as recurrent: Secondary | ICD-10-CM | POA: Diagnosis not present

## 2019-04-24 DIAGNOSIS — R1312 Dysphagia, oropharyngeal phase: Secondary | ICD-10-CM | POA: Diagnosis not present

## 2019-04-24 DIAGNOSIS — M6281 Muscle weakness (generalized): Secondary | ICD-10-CM | POA: Diagnosis not present

## 2019-04-24 DIAGNOSIS — L89312 Pressure ulcer of right buttock, stage 2: Secondary | ICD-10-CM | POA: Diagnosis not present

## 2019-04-24 DIAGNOSIS — A0472 Enterocolitis due to Clostridium difficile, not specified as recurrent: Secondary | ICD-10-CM | POA: Diagnosis not present

## 2019-04-24 DIAGNOSIS — J1282 Pneumonia due to coronavirus disease 2019: Secondary | ICD-10-CM | POA: Diagnosis not present

## 2019-04-24 DIAGNOSIS — U071 COVID-19: Secondary | ICD-10-CM | POA: Diagnosis not present

## 2019-04-25 DIAGNOSIS — M6281 Muscle weakness (generalized): Secondary | ICD-10-CM | POA: Diagnosis not present

## 2019-04-25 DIAGNOSIS — J1282 Pneumonia due to coronavirus disease 2019: Secondary | ICD-10-CM | POA: Diagnosis not present

## 2019-04-25 DIAGNOSIS — A0472 Enterocolitis due to Clostridium difficile, not specified as recurrent: Secondary | ICD-10-CM | POA: Diagnosis not present

## 2019-04-25 DIAGNOSIS — R1312 Dysphagia, oropharyngeal phase: Secondary | ICD-10-CM | POA: Diagnosis not present

## 2019-04-25 DIAGNOSIS — U071 COVID-19: Secondary | ICD-10-CM | POA: Diagnosis not present

## 2019-04-26 DIAGNOSIS — U071 COVID-19: Secondary | ICD-10-CM | POA: Diagnosis not present

## 2019-04-26 DIAGNOSIS — J1282 Pneumonia due to coronavirus disease 2019: Secondary | ICD-10-CM | POA: Diagnosis not present

## 2019-04-26 DIAGNOSIS — M6281 Muscle weakness (generalized): Secondary | ICD-10-CM | POA: Diagnosis not present

## 2019-04-26 DIAGNOSIS — A0472 Enterocolitis due to Clostridium difficile, not specified as recurrent: Secondary | ICD-10-CM | POA: Diagnosis not present

## 2019-04-26 DIAGNOSIS — R1312 Dysphagia, oropharyngeal phase: Secondary | ICD-10-CM | POA: Diagnosis not present

## 2019-04-29 DIAGNOSIS — U071 COVID-19: Secondary | ICD-10-CM | POA: Diagnosis not present

## 2019-04-29 DIAGNOSIS — R1312 Dysphagia, oropharyngeal phase: Secondary | ICD-10-CM | POA: Diagnosis not present

## 2019-04-30 DIAGNOSIS — Z23 Encounter for immunization: Secondary | ICD-10-CM | POA: Diagnosis not present

## 2019-04-30 DIAGNOSIS — R1312 Dysphagia, oropharyngeal phase: Secondary | ICD-10-CM | POA: Diagnosis not present

## 2019-04-30 DIAGNOSIS — U071 COVID-19: Secondary | ICD-10-CM | POA: Diagnosis not present

## 2019-05-01 DIAGNOSIS — M810 Age-related osteoporosis without current pathological fracture: Secondary | ICD-10-CM | POA: Diagnosis not present

## 2019-05-01 DIAGNOSIS — F331 Major depressive disorder, recurrent, moderate: Secondary | ICD-10-CM | POA: Diagnosis not present

## 2019-05-01 DIAGNOSIS — A0472 Enterocolitis due to Clostridium difficile, not specified as recurrent: Secondary | ICD-10-CM | POA: Diagnosis not present

## 2019-05-01 DIAGNOSIS — F0281 Dementia in other diseases classified elsewhere with behavioral disturbance: Secondary | ICD-10-CM | POA: Diagnosis not present

## 2019-05-01 DIAGNOSIS — U071 COVID-19: Secondary | ICD-10-CM | POA: Diagnosis not present

## 2019-05-01 DIAGNOSIS — E039 Hypothyroidism, unspecified: Secondary | ICD-10-CM | POA: Diagnosis not present

## 2019-05-01 DIAGNOSIS — D649 Anemia, unspecified: Secondary | ICD-10-CM | POA: Diagnosis not present

## 2019-05-01 DIAGNOSIS — G301 Alzheimer's disease with late onset: Secondary | ICD-10-CM | POA: Diagnosis not present

## 2019-05-01 DIAGNOSIS — R54 Age-related physical debility: Secondary | ICD-10-CM | POA: Diagnosis not present

## 2019-05-02 DIAGNOSIS — R1312 Dysphagia, oropharyngeal phase: Secondary | ICD-10-CM | POA: Diagnosis not present

## 2019-05-02 DIAGNOSIS — U071 COVID-19: Secondary | ICD-10-CM | POA: Diagnosis not present

## 2019-05-02 DIAGNOSIS — R569 Unspecified convulsions: Secondary | ICD-10-CM | POA: Diagnosis not present

## 2019-05-02 DIAGNOSIS — G309 Alzheimer's disease, unspecified: Secondary | ICD-10-CM | POA: Diagnosis not present

## 2019-05-04 DIAGNOSIS — R6883 Chills (without fever): Secondary | ICD-10-CM | POA: Diagnosis not present

## 2019-05-06 DIAGNOSIS — I10 Essential (primary) hypertension: Secondary | ICD-10-CM | POA: Diagnosis not present

## 2019-05-06 DIAGNOSIS — R5381 Other malaise: Secondary | ICD-10-CM | POA: Diagnosis not present

## 2019-05-06 DIAGNOSIS — R0902 Hypoxemia: Secondary | ICD-10-CM | POA: Diagnosis not present

## 2019-05-06 DIAGNOSIS — D649 Anemia, unspecified: Secondary | ICD-10-CM | POA: Diagnosis not present

## 2019-05-08 DIAGNOSIS — D649 Anemia, unspecified: Secondary | ICD-10-CM | POA: Diagnosis not present

## 2019-05-09 DIAGNOSIS — R319 Hematuria, unspecified: Secondary | ICD-10-CM | POA: Diagnosis not present

## 2019-05-09 DIAGNOSIS — N390 Urinary tract infection, site not specified: Secondary | ICD-10-CM | POA: Diagnosis not present

## 2019-05-09 DIAGNOSIS — R569 Unspecified convulsions: Secondary | ICD-10-CM | POA: Diagnosis not present

## 2019-05-13 DIAGNOSIS — I10 Essential (primary) hypertension: Secondary | ICD-10-CM | POA: Diagnosis not present

## 2019-05-13 DIAGNOSIS — D649 Anemia, unspecified: Secondary | ICD-10-CM | POA: Diagnosis not present

## 2019-05-22 DIAGNOSIS — F0281 Dementia in other diseases classified elsewhere with behavioral disturbance: Secondary | ICD-10-CM | POA: Diagnosis not present

## 2019-05-22 DIAGNOSIS — U071 COVID-19: Secondary | ICD-10-CM | POA: Diagnosis not present

## 2019-05-22 DIAGNOSIS — L89312 Pressure ulcer of right buttock, stage 2: Secondary | ICD-10-CM | POA: Diagnosis not present

## 2019-05-23 DIAGNOSIS — R05 Cough: Secondary | ICD-10-CM | POA: Diagnosis not present

## 2019-05-27 DIAGNOSIS — M818 Other osteoporosis without current pathological fracture: Secondary | ICD-10-CM | POA: Diagnosis not present

## 2019-05-27 DIAGNOSIS — L89312 Pressure ulcer of right buttock, stage 2: Secondary | ICD-10-CM | POA: Diagnosis not present

## 2019-05-27 DIAGNOSIS — F0281 Dementia in other diseases classified elsewhere with behavioral disturbance: Secondary | ICD-10-CM | POA: Diagnosis not present

## 2019-05-27 DIAGNOSIS — M6281 Muscle weakness (generalized): Secondary | ICD-10-CM | POA: Diagnosis not present

## 2019-05-27 DIAGNOSIS — M859 Disorder of bone density and structure, unspecified: Secondary | ICD-10-CM | POA: Diagnosis not present

## 2019-05-27 DIAGNOSIS — F331 Major depressive disorder, recurrent, moderate: Secondary | ICD-10-CM | POA: Diagnosis not present

## 2019-05-27 DIAGNOSIS — G301 Alzheimer's disease with late onset: Secondary | ICD-10-CM | POA: Diagnosis not present

## 2019-05-27 DIAGNOSIS — K5900 Constipation, unspecified: Secondary | ICD-10-CM | POA: Diagnosis not present

## 2019-05-27 DIAGNOSIS — E039 Hypothyroidism, unspecified: Secondary | ICD-10-CM | POA: Diagnosis not present

## 2019-05-27 DIAGNOSIS — R634 Abnormal weight loss: Secondary | ICD-10-CM | POA: Diagnosis not present

## 2019-05-28 DIAGNOSIS — R54 Age-related physical debility: Secondary | ICD-10-CM | POA: Diagnosis not present

## 2019-05-28 DIAGNOSIS — E43 Unspecified severe protein-calorie malnutrition: Secondary | ICD-10-CM | POA: Diagnosis not present

## 2019-05-28 DIAGNOSIS — R634 Abnormal weight loss: Secondary | ICD-10-CM | POA: Diagnosis not present

## 2019-05-28 DIAGNOSIS — E860 Dehydration: Secondary | ICD-10-CM | POA: Diagnosis not present

## 2019-05-29 DIAGNOSIS — F0281 Dementia in other diseases classified elsewhere with behavioral disturbance: Secondary | ICD-10-CM | POA: Diagnosis not present

## 2019-05-29 DIAGNOSIS — F064 Anxiety disorder due to known physiological condition: Secondary | ICD-10-CM | POA: Diagnosis not present

## 2019-05-29 DIAGNOSIS — G301 Alzheimer's disease with late onset: Secondary | ICD-10-CM | POA: Diagnosis not present

## 2019-05-29 DIAGNOSIS — F331 Major depressive disorder, recurrent, moderate: Secondary | ICD-10-CM | POA: Diagnosis not present

## 2019-06-27 DEATH — deceased
